# Patient Record
Sex: Male | Born: 1965
Health system: Southern US, Community
[De-identification: ages and names within clinical notes are randomized; demographics above are authoritative.]

## PROBLEM LIST (undated history)

## (undated) DIAGNOSIS — E119 Type 2 diabetes mellitus without complications: Secondary | ICD-10-CM

## (undated) DIAGNOSIS — K449 Diaphragmatic hernia without obstruction or gangrene: Secondary | ICD-10-CM

## (undated) DIAGNOSIS — Z8719 Personal history of other diseases of the digestive system: Secondary | ICD-10-CM

## (undated) DIAGNOSIS — Z8711 Personal history of peptic ulcer disease: Secondary | ICD-10-CM

## (undated) DIAGNOSIS — I10 Essential (primary) hypertension: Secondary | ICD-10-CM

## (undated) DIAGNOSIS — I4891 Unspecified atrial fibrillation: Secondary | ICD-10-CM

## (undated) DIAGNOSIS — I5021 Acute systolic (congestive) heart failure: Secondary | ICD-10-CM

## (undated) HISTORY — DX: Personal history of other diseases of the digestive system: Z87.19

## (undated) HISTORY — DX: Diaphragmatic hernia without obstruction or gangrene: K44.9

## (undated) HISTORY — PX: ABDOMINAL SURGERY: SHX537

## (undated) HISTORY — PX: APPENDECTOMY: SHX54

## (undated) HISTORY — DX: Personal history of peptic ulcer disease: Z87.11

---

## 2017-11-19 ENCOUNTER — Emergency Department (HOSPITAL_COMMUNITY): Payer: Self-pay

## 2017-11-19 ENCOUNTER — Other Ambulatory Visit: Payer: Self-pay

## 2017-11-19 ENCOUNTER — Encounter (HOSPITAL_COMMUNITY): Payer: Self-pay | Admitting: Emergency Medicine

## 2017-11-19 ENCOUNTER — Inpatient Hospital Stay (HOSPITAL_COMMUNITY)
Admission: EM | Admit: 2017-11-19 | Discharge: 2017-11-26 | DRG: 291 | Disposition: A | Discharge status: Home / Self Care | Payer: Self-pay | Attending: Internal Medicine | Admitting: Internal Medicine

## 2017-11-19 DIAGNOSIS — Z6841 Body Mass Index (BMI) 40.0 and over, adult: Secondary | ICD-10-CM

## 2017-11-19 DIAGNOSIS — I080 Rheumatic disorders of both mitral and aortic valves: Secondary | ICD-10-CM | POA: Diagnosis present

## 2017-11-19 DIAGNOSIS — L03119 Cellulitis of unspecified part of limb: Secondary | ICD-10-CM

## 2017-11-19 DIAGNOSIS — E118 Type 2 diabetes mellitus with unspecified complications: Secondary | ICD-10-CM

## 2017-11-19 DIAGNOSIS — I429 Cardiomyopathy, unspecified: Secondary | ICD-10-CM | POA: Diagnosis present

## 2017-11-19 DIAGNOSIS — E66813 Obesity, class 3: Secondary | ICD-10-CM | POA: Diagnosis present

## 2017-11-19 DIAGNOSIS — R791 Abnormal coagulation profile: Secondary | ICD-10-CM | POA: Diagnosis present

## 2017-11-19 DIAGNOSIS — I10 Essential (primary) hypertension: Secondary | ICD-10-CM | POA: Diagnosis present

## 2017-11-19 DIAGNOSIS — I4819 Other persistent atrial fibrillation: Secondary | ICD-10-CM | POA: Diagnosis present

## 2017-11-19 DIAGNOSIS — N183 Chronic kidney disease, stage 3 unspecified: Secondary | ICD-10-CM | POA: Diagnosis present

## 2017-11-19 DIAGNOSIS — I5023 Acute on chronic systolic (congestive) heart failure: Secondary | ICD-10-CM

## 2017-11-19 DIAGNOSIS — E1122 Type 2 diabetes mellitus with diabetic chronic kidney disease: Secondary | ICD-10-CM | POA: Diagnosis present

## 2017-11-19 DIAGNOSIS — I4891 Unspecified atrial fibrillation: Secondary | ICD-10-CM | POA: Diagnosis present

## 2017-11-19 DIAGNOSIS — R001 Bradycardia, unspecified: Secondary | ICD-10-CM | POA: Diagnosis not present

## 2017-11-19 DIAGNOSIS — I481 Persistent atrial fibrillation: Secondary | ICD-10-CM | POA: Diagnosis present

## 2017-11-19 DIAGNOSIS — E119 Type 2 diabetes mellitus without complications: Secondary | ICD-10-CM

## 2017-11-19 DIAGNOSIS — I5022 Chronic systolic (congestive) heart failure: Secondary | ICD-10-CM | POA: Diagnosis present

## 2017-11-19 DIAGNOSIS — Z8249 Family history of ischemic heart disease and other diseases of the circulatory system: Secondary | ICD-10-CM

## 2017-11-19 DIAGNOSIS — I130 Hypertensive heart and chronic kidney disease with heart failure and stage 1 through stage 4 chronic kidney disease, or unspecified chronic kidney disease: Principal | ICD-10-CM | POA: Diagnosis present

## 2017-11-19 DIAGNOSIS — E662 Morbid (severe) obesity with alveolar hypoventilation: Secondary | ICD-10-CM | POA: Diagnosis present

## 2017-11-19 DIAGNOSIS — I5021 Acute systolic (congestive) heart failure: Secondary | ICD-10-CM | POA: Diagnosis present

## 2017-11-19 DIAGNOSIS — I447 Left bundle-branch block, unspecified: Secondary | ICD-10-CM | POA: Diagnosis present

## 2017-11-19 DIAGNOSIS — N179 Acute kidney failure, unspecified: Secondary | ICD-10-CM | POA: Diagnosis present

## 2017-11-19 HISTORY — DX: Essential (primary) hypertension: I10

## 2017-11-19 HISTORY — DX: Type 2 diabetes mellitus without complications: E11.9

## 2017-11-19 HISTORY — DX: Acute systolic (congestive) heart failure: I50.21

## 2017-11-19 HISTORY — DX: Unspecified atrial fibrillation: I48.91

## 2017-11-19 HISTORY — DX: Morbid (severe) obesity due to excess calories: E66.01

## 2017-11-19 LAB — COMPREHENSIVE METABOLIC PANEL WITH GFR
ALT: <5 U/L — ABNORMAL LOW (ref 17–63)
AST: 37 U/L (ref 15–41)
Albumin: 3.7 g/dL (ref 3.5–5.0)
Alkaline Phosphatase: 84 U/L (ref 38–126)
Anion gap: 9 (ref 5–15)
BUN: 20 mg/dL (ref 6–20)
CO2: 26 mmol/L (ref 22–32)
Calcium: 8.9 mg/dL (ref 8.9–10.3)
Chloride: 110 mmol/L (ref 101–111)
Creatinine, Ser: 1.55 mg/dL — ABNORMAL HIGH (ref 0.61–1.24)
GFR calc Af Amer: 59 mL/min — ABNORMAL LOW
GFR calc non Af Amer: 51 mL/min — ABNORMAL LOW
Glucose, Bld: 110 mg/dL — ABNORMAL HIGH (ref 65–99)
Potassium: 4.2 mmol/L (ref 3.5–5.1)
Sodium: 145 mmol/L (ref 135–145)
Total Bilirubin: 1 mg/dL (ref 0.3–1.2)
Total Protein: 6.9 g/dL (ref 6.5–8.1)

## 2017-11-19 LAB — CBC WITH DIFFERENTIAL/PLATELET
BASOS ABS: 0 10*3/uL (ref 0.0–0.1)
Basophils Relative: 0 %
Eosinophils Absolute: 0.2 10*3/uL (ref 0.0–0.7)
Eosinophils Relative: 2 %
HEMATOCRIT: 41.2 % (ref 39.0–52.0)
HEMOGLOBIN: 13.5 g/dL (ref 13.0–17.0)
LYMPHS PCT: 20 %
Lymphs Abs: 1.7 10*3/uL (ref 0.7–4.0)
MCH: 30.7 pg (ref 26.0–34.0)
MCHC: 32.8 g/dL (ref 30.0–36.0)
MCV: 93.6 fL (ref 78.0–100.0)
MONO ABS: 0.6 10*3/uL (ref 0.1–1.0)
MONOS PCT: 7 %
NEUTROS ABS: 6 10*3/uL (ref 1.7–7.7)
NEUTROS PCT: 71 %
Platelets: 249 10*3/uL (ref 150–400)
RBC: 4.4 MIL/uL (ref 4.22–5.81)
RDW: 15 % (ref 11.5–15.5)
WBC: 8.5 10*3/uL (ref 4.0–10.5)

## 2017-11-19 LAB — I-STAT TROPONIN, ED: TROPONIN I, POC: 0.08 ng/mL (ref 0.00–0.08)

## 2017-11-19 LAB — BRAIN NATRIURETIC PEPTIDE: B Natriuretic Peptide: 248.5 pg/mL — ABNORMAL HIGH (ref 0.0–100.0)

## 2017-11-19 LAB — I-STAT CG4 LACTIC ACID, ED: LACTIC ACID, VENOUS: 1.25 mmol/L (ref 0.5–1.9)

## 2017-11-19 LAB — D-DIMER, QUANTITATIVE: D-Dimer, Quant: 1.21 ug{FEU}/mL — ABNORMAL HIGH (ref 0.00–0.50)

## 2017-11-19 MED ORDER — SODIUM CHLORIDE 0.9% FLUSH: 3.0000 mL | INTRAVENOUS | Status: DC | PRN

## 2017-11-19 MED ORDER — INSULIN ASPART 100 UNIT/ML ~~LOC~~ SOLN: 0.0000 [IU] | Freq: Three times a day (TID) | SUBCUTANEOUS | Status: DC

## 2017-11-19 MED ORDER — IOPAMIDOL (ISOVUE-370) INJECTION 76%
INTRAVENOUS | Status: AC
  Administered 2017-11-19: 100 mL
  Filled 2017-11-19: qty 100

## 2017-11-19 MED ORDER — SODIUM CHLORIDE 0.9 % IV SOLN: 250.0000 mL | INTRAVENOUS | Status: DC | PRN

## 2017-11-19 MED ORDER — METOPROLOL TARTRATE 25 MG PO TABS
12.5000 mg | ORAL_TABLET | Freq: Two times a day (BID) | ORAL | Status: DC
  Administered 2017-11-19 – 2017-11-20 (×2): 12.5 mg via ORAL
  Filled 2017-11-19 (×2): qty 1

## 2017-11-19 MED ORDER — SODIUM CHLORIDE 0.9 % IJ SOLN
INTRAMUSCULAR | Status: AC
  Filled 2017-11-19: qty 50

## 2017-11-19 MED ORDER — HEPARIN BOLUS VIA INFUSION
5000.0000 [IU] | Freq: Once | INTRAVENOUS | Status: AC
  Administered 2017-11-20: 5000 [IU] via INTRAVENOUS
  Filled 2017-11-19: qty 5000

## 2017-11-19 MED ORDER — FUROSEMIDE 10 MG/ML IJ SOLN
20.0000 mg | Freq: Once | INTRAMUSCULAR | Status: AC
  Administered 2017-11-19: 20 mg via INTRAVENOUS
  Filled 2017-11-19: qty 4

## 2017-11-19 MED ORDER — ONDANSETRON HCL 4 MG/2ML IJ SOLN: 4.0000 mg | Freq: Four times a day (QID) | INTRAMUSCULAR | Status: DC | PRN

## 2017-11-19 MED ORDER — HEPARIN (PORCINE) IN NACL 100-0.45 UNIT/ML-% IJ SOLN
1600.0000 [IU]/h | INTRAMUSCULAR | Status: DC
  Administered 2017-11-20: 1600 [IU]/h via INTRAVENOUS
  Filled 2017-11-19 (×2): qty 250

## 2017-11-19 MED ORDER — ACETAMINOPHEN 325 MG PO TABS: 650.0000 mg | ORAL_TABLET | ORAL | Status: DC | PRN

## 2017-11-19 MED ORDER — FUROSEMIDE 10 MG/ML IJ SOLN
40.0000 mg | Freq: Every day | INTRAMUSCULAR | Status: DC
  Filled 2017-11-19: qty 4

## 2017-11-19 MED ORDER — ALBUTEROL SULFATE (2.5 MG/3ML) 0.083% IN NEBU
5.0000 mg | INHALATION_SOLUTION | Freq: Once | RESPIRATORY_TRACT | Status: AC
  Administered 2017-11-19: 5 mg via RESPIRATORY_TRACT
  Filled 2017-11-19: qty 6

## 2017-11-19 MED ORDER — SODIUM CHLORIDE 0.9% FLUSH
3.0000 mL | Freq: Two times a day (BID) | INTRAVENOUS | Status: DC
  Administered 2017-11-19 – 2017-11-26 (×12): 3 mL via INTRAVENOUS

## 2017-11-19 NOTE — H&P (Signed)
History and Physical    Joe Blake IHK:[id number] DOB: [birth year] DOA: 11/19/2017  PCP: Patient, No Pcp Per  Patient coming from: Home  I have personally briefly reviewed patient's old medical records in [place]  Chief Complaint: Leg swelling, SOB  HPI: Joe Blake is a 52 y.o. male with medical history significant of CHF, DM2, HTN.  Patient has been off of meds for last 3-4 years.  Presents to ED with c/o SOB, BLE edema, wt gain over past 12 days.  H/o hospital stay in [place] for CHF some 5-6 years ago.   ED Course: CXR and CTA chest show CHF findings.  Given 20mg  IV lasix.  Creat 1.5.  EKG shows LBBB and A.Fib.   Review of Systems: As per HPI otherwise 10 point review of systems negative.   Past Medical History:  Diagnosis Date  . CHF (congestive heart failure) ([place])   . Diabetes mellitus without complication ([place])   . Hypertension     History reviewed. No pertinent surgical history.   reports that  has never smoked. He does not have any smokeless tobacco history on file. He reports that he does not drink alcohol or use drugs.  Allergies  Allergen Reactions  . Cucumber Extract     No family history on file.   Prior to Admission medications   Medication Sig Start Date End Date Taking? Authorizing Provider  naproxen sodium (ALEVE) 220 MG tablet Take 220 mg by mouth 2 (two) times daily as needed (pain).   Yes [provider]    Physical Exam: Vitals:   11/19/17 2127 11/19/17 2130 11/19/17 2215 11/19/17 2315  BP: (!) 155/107 (!) 166/111 (!) 163/130 (!) 180/115  Pulse: (!) 110 (!) 114 (!) 58 (!) 126  Resp: 17 (!) 21 20   Temp:      TempSrc:      SpO2: 94% 95% 99%   Weight:      Height:        Constitutional: NAD, calm, comfortable Eyes: PERRL, lids and conjunctivae normal ENMT: Mucous membranes are moist. Posterior pharynx clear of any exudate or lesions.Normal dentition.  Neck: normal, supple, no masses, no thyromegaly Respiratory: clear to  auscultation bilaterally, no wheezing, no crackles. Normal respiratory effort. No accessory muscle use.  Cardiovascular: IRR, IRR. Abdomen: no tenderness, no masses palpated. No hepatosplenomegaly. Bowel sounds positive.  Musculoskeletal: no clubbing / cyanosis. No joint deformity upper and lower extremities. Good ROM, no contractures. Normal muscle tone.  Skin: no rashes, lesions, ulcers. No induration Neurologic: CN 2-12 grossly intact. Sensation intact, DTR normal. Strength 5/5 in all 4.  Psychiatric: Normal judgment and insight. Alert and oriented x 3. Normal mood.    Labs on Admission: I have personally reviewed following labs and imaging studies  CBC: Recent Labs  Lab 11/19/17 2010  WBC 8.5  NEUTROABS 6.0  HGB 13.5  HCT 41.2  MCV 93.6  PLT 756   Basic Metabolic Panel: Recent Labs  Lab 11/19/17 2010  NA 145  K 4.2  CL 110  CO2 26  GLUCOSE 110*  BUN 20  CREATININE 1.55*  CALCIUM 8.9   GFR: Estimated Creatinine Clearance: 91.4 mL/min (A) (by C-G formula based on SCr of 1.55 mg/dL (H)). Liver Function Tests: Recent Labs  Lab 11/19/17 2010  AST 37  ALT <5*  ALKPHOS 84  BILITOT 1.0  PROT 6.9  ALBUMIN 3.7   No results for input(s): LIPASE, AMYLASE in the last 168 hours. No results for input(s): AMMONIA  in the last 168 hours. Coagulation Profile: No results for input(s): INR, PROTIME in the last 168 hours. Cardiac Enzymes: No results for input(s): CKTOTAL, CKMB, CKMBINDEX, TROPONINI in the last 168 hours. BNP (last 3 results) No results for input(s): PROBNP in the last 8760 hours. HbA1C: No results for input(s): HGBA1C in the last 72 hours. CBG: No results for input(s): GLUCAP in the last 168 hours. Lipid Profile: No results for input(s): CHOL, HDL, LDLCALC, TRIG, CHOLHDL, LDLDIRECT in the last 72 hours. Thyroid Function Tests: No results for input(s): TSH, T4TOTAL, FREET4, T3FREE, THYROIDAB in the last 72 hours. Anemia Panel: No results for input(s):  VITAMINB12, FOLATE, FERRITIN, TIBC, IRON, RETICCTPCT in the last 72 hours. Urine analysis: No results found for: COLORURINE, APPEARANCEUR, LABSPEC, PHURINE, GLUCOSEU, HGBUR, BILIRUBINUR, KETONESUR, PROTEINUR, UROBILINOGEN, NITRITE, LEUKOCYTESUR  Radiological Exams on Admission: Dg Chest 2 View  Result Date: 11/19/2017 CLINICAL DATA:  Labored breathing, lower extremity swelling. EXAM: CHEST - 2 VIEW COMPARISON:  None. FINDINGS: Cardiomegaly. Lungs are clear. No pleural effusion or pneumothorax seen. Osseous structures about the chest are unremarkable. IMPRESSION: Cardiomegaly. No acute findings. No evidence of pneumonia or pulmonary edema. Electronically Signed   By: Franki Cabot M.D.   On: 11/19/2017 19:52   Ct Angio Chest Pe W And/or Wo Contrast  Result Date: 11/19/2017 CLINICAL DATA:  PE suspected, intermediate prob, positive D-dimer. Labored breathing. Waking/fluid retention. History of CHF. EXAM: CT ANGIOGRAPHY CHEST WITH CONTRAST TECHNIQUE: Multidetector CT imaging of the chest was performed using the standard protocol during bolus administration of intravenous contrast. Multiplanar CT image reconstructions and MIPs were obtained to evaluate the vascular anatomy. CONTRAST:  143mL ISOVUE-370 IOPAMIDOL (ISOVUE-370) INJECTION 76% COMPARISON:  Chest radiograph earlier this day. FINDINGS: Cardiovascular: Breathing motion artifact partially limits assessment. There are no filling defects within the pulmonary arteries to suggest pulmonary embolus. Multi chamber cardiomegaly. Thoracic aorta is normal in caliber. Contrast refluxes into the hepatic veins and IVC. Minimal pericardial fluid, physiologic versus small effusion. Mediastinum/Nodes: Multiple small mediastinal nodes all subcentimeter. Prominent right hilar nodes measuring 13 mm short axis. Smaller left hilar nodes. Esophagus is decompressed. Lungs/Pleura: Small bilateral pleural effusions, right greater than left. Mild septal thickening at the lung  bases. Heterogeneous ground-glass opacities in the upper lobes, detail obscured by motion, nonspecific. No confluent consolidation. No pulmonary mass. Trachea and mainstem bronchi not well evaluated due to breathing motion expiratory imaging. Upper Abdomen: Mild contrast refluxing into the hepatic veins and IVC. Small amount of perihepatic ascites. Musculoskeletal: There are no acute or suspicious osseous abnormalities. Review of the MIP images confirms the above findings. IMPRESSION: 1. No pulmonary embolus. 2. Findings suggest mild CHF with small bilateral pleural effusions, mild pulmonary edema and cardiomegaly. Mild contrast refluxing into the hepatic veins and IVC. Small perihepatic ascites. 3. Heterogeneous attenuation/ground-glass opacities in the upper lobes, may be pulmonary edema but are nonspecific. Small airways disease is also considered. Breathing motion limits evaluation. 4. Mild mediastinal and right hilar adenopathy is likely reactive in the setting CHF Electronically Signed   By: Jeb Levering M.D.   On: 11/19/2017 22:46    EKG: Independently reviewed.  Assessment/Plan Principal Problem:   Acute on chronic systolic CHF (congestive heart failure) (HCC) Active Problems:   New onset a-fib (HCC)   DM2 (diabetes mellitus, type 2) (HCC)   HTN (hypertension)   Obesity, Class III, BMI 40-49.9 (morbid obesity) (HCC)   CKD (chronic kidney disease) stage 3, GFR 30-59 ml/min (HCC)   LBBB (left bundle branch block)  1. Acute on chronic CHF - 1. CHF pathway 2. Lasix 40mg  IV daily 3. BMP daily 4. Strict intake and output 5. Tele monitor 6. 2d echo 7. Sending for records 8. May warrant cards consult depending on findings 2. A.Fib - 1. CHADS vasc of at least 3 2. Heparin gtt started 3. Will try and rate control with Metoprolol, starting 12.5mg  PO BID (suspect this will need to be increased). 3. HTN - 1. Starting metoprolol as above, increase as needed 2. Lasix as above 4. CKD  stage 3 - 1. Monitor creatinine with diuresis 2. Not doing ACEi for this reason 5. LBBB - 1. Unclear chronicity 2. Cardiac work up as above 6. DM2 - 1. Mod scale SSI AC 2. A1C  DVT prophylaxis: Heparin gtt Code Status: Full Family Communication: No family in room Disposition Plan: Home after admit Consults called: None Admission status: Admit to inpatient   Etta Quill DO [facility] Pager [phone]  If 7AM-7PM, please contact day team taking care of patient [email or web address] Password [facility]  11/19/2017, 11:36 PM

## 2017-11-19 NOTE — ED Notes (Signed)
Pt being taken to X-ray, will assess when he returns.

## 2017-11-19 NOTE — ED Triage Notes (Signed)
Patient presents ambulatory with labored breathing states he cut his left leg a few days ago and has had increased swelling since. Pt states excess weight over the past 12 days. States he has been taking lasix as prescribed but not helping with shortness of breath. Breathing treatment started in triage.

## 2017-11-19 NOTE — Progress Notes (Signed)
ANTICOAGULATION CONSULT NOTE - Initial Consult  Pharmacy Consult for IV heparin Indication: atrial fibrillation  Allergies  Allergen Reactions  . Cucumber Extract     Patient Measurements: Height: 6\' 1"  (185.4 cm) Weight: (!) 360 lb (163.3 kg) IBW/kg (Calculated) : 79.9 Heparin Dosing Weight: 104 kg  Vital Signs: Temp: 98.6 F (37 C) (03/18 1906) Temp Source: Oral (03/18 1906) BP: 163/130 (03/18 2215) Pulse Rate: 58 (03/18 2215)  Labs: Recent Labs    11/19/17 2010  HGB 13.5  HCT 41.2  PLT 249  CREATININE 1.55*    Estimated Creatinine Clearance: 91.4 mL/min (A) (by C-G formula based on SCr of 1.55 mg/dL (H)).   Medical History: Past Medical History:  Diagnosis Date  . CHF (congestive heart failure) ([place])   . Diabetes mellitus without complication ([place])   . Hypertension     Medications:  Scheduled:  . [START ON 11/20/2017] furosemide  40 mg Intravenous Daily  . [START ON 11/20/2017] insulin aspart  0-15 Units Subcutaneous TID WC  . metoprolol tartrate  12.5 mg Oral BID  . sodium chloride      . sodium chloride flush  3 mL Intravenous Q12H   Infusions:  . sodium chloride      Assessment: 51 yoM mildly tachypneic and in  A-fib. IV heparin per Rx.  Baseline H/H, plts WNL. Coags pending. Goal of Therapy:  Heparin level 0.3-0.7 units/ml Monitor platelets by anticoagulation protocol: Yes   Plan:  Baseline coags STAT Heparin 5000 units IV x1 now Start heparin drip at 1600 units/hr Daily HL and CBC Check 1st HL in 6 hours  Dorrene German 11/19/2017,11:08 PM

## 2017-11-19 NOTE — ED Notes (Signed)
ED TO INPATIENT HANDOFF REPORT  Name/Age/Gender Joe Blake 52 y.o. male  Code Status    Code Status Orders  (From admission, onward)        Start     Ordered   11/19/17 2302  Full code  Continuous     11/19/17 2304    Code Status History    Date Active Date Inactive Code Status Order ID Comments User Context   This patient has a current code status but no historical code status.      Home/SNF/Other Home  Chief Complaint Cellulitus; Leg Swelling; Shortness of Breath  Level of Care/Admitting Diagnosis ED Disposition    ED Disposition Condition Comment   Admit  Hospital Area: [place] [[id number]]  Level of Care: Telemetry [5]  Admit to tele based on following criteria: Acute CHF  Diagnosis: New onset a-fib [facility]) [[id number]]  Admitting Physician: Etta Quill [id number]  Attending Physician: Etta Quill [phone]  Estimated length of stay: past midnight tomorrow  Certification:: I certify this patient will need inpatient services for at least 2 midnights  PT Class (Do Not Modify): Inpatient [101]  PT Acc Code (Do Not Modify): Private [1]       Medical History Past Medical History:  Diagnosis Date  . CHF (congestive heart failure) ([place])   . Diabetes mellitus without complication ([place])   . Hypertension     Allergies Allergies  Allergen Reactions  . Cucumber Extract     IV Location/Drains/Wounds Patient Lines/Drains/Airways Status   Active Line/Drains/Airways    Name:   Placement date:   Placement time:   Site:   Days:   Peripheral IV 11/19/17 Right Forearm   11/19/17    2000    Forearm   less than 1          Labs/Imaging Results for orders placed or performed during the hospital encounter of 11/19/17 (from the past 48 hour(s))  Comprehensive metabolic panel     Status: Abnormal   Collection Time: 11/19/17  8:10 PM  Result Value Ref Range   Sodium 145 135 - 145 mmol/L   Potassium 4.2 3.5 - 5.1 mmol/L   Chloride 110 101 - 111  mmol/L   CO2 26 22 - 32 mmol/L   Glucose, Bld 110 (H) 65 - 99 mg/dL   BUN 20 6 - 20 mg/dL   Creatinine, Ser 1.55 (H) 0.61 - 1.24 mg/dL   Calcium 8.9 8.9 - 10.3 mg/dL   Total Protein 6.9 6.5 - 8.1 g/dL   Albumin 3.7 3.5 - 5.0 g/dL   AST 37 15 - 41 U/L   ALT <5 (L) 17 - 63 U/L   Alkaline Phosphatase 84 38 - 126 U/L   Total Bilirubin 1.0 0.3 - 1.2 mg/dL   GFR calc non Af Amer 51 (L) >60 mL/min   GFR calc Af Amer 59 (L) >60 mL/min    Comment: (NOTE) The eGFR has been calculated using the CKD EPI equation. This calculation has not been validated in all clinical situations. eGFR's persistently <60 mL/min signify possible Chronic Kidney Disease.    Anion gap 9 5 - 15    Comment: Performed at [facility], [place] [address] [place], [place] [address]  CBC with Differential/Platelet     Status: None   Collection Time: 11/19/17  8:10 PM  Result Value Ref Range   WBC 8.5 4.0 - 10.5 K/uL   RBC 4.40 4.22 - 5.81 MIL/uL   Hemoglobin 13.5  13.0 - 17.0 g/dL   HCT 41.2 39.0 - 52.0 %   MCV 93.6 78.0 - 100.0 fL   MCH 30.7 26.0 - 34.0 pg   MCHC 32.8 30.0 - 36.0 g/dL   RDW 15.0 11.5 - 15.5 %   Platelets 249 150 - 400 K/uL   Neutrophils Relative % 71 %   Neutro Abs 6.0 1.7 - 7.7 K/uL   Lymphocytes Relative 20 %   Lymphs Abs 1.7 0.7 - 4.0 K/uL   Monocytes Relative 7 %   Monocytes Absolute 0.6 0.1 - 1.0 K/uL   Eosinophils Relative 2 %   Eosinophils Absolute 0.2 0.0 - 0.7 K/uL   Basophils Relative 0 %   Basophils Absolute 0.0 0.0 - 0.1 K/uL    Comment: Performed at [facility], [place] [address] [place], [place] [address]  Brain natriuretic peptide     Status: Abnormal   Collection Time: 11/19/17  8:10 PM  Result Value Ref Range   B Natriuretic Peptide 248.5 (H) 0.0 - 100.0 pg/mL    Comment: Performed at [facility], [place] [address] [place], [place] [address]  D-dimer, quantitative (not at [facility])     Status: Abnormal   Collection Time:  11/19/17  8:10 PM  Result Value Ref Range   D-Dimer, Quant 1.21 (H) 0.00 - 0.50 ug/mL-FEU    Comment: (NOTE) At the manufacturer cut-off of 0.50 ug/mL FEU, this assay has been documented to exclude PE with a sensitivity and negative predictive value of 97 to 99%.  At this time, this assay has not been approved by the FDA to exclude DVT/VTE. Results should be correlated with clinical presentation. Performed at [facility], [place] [address] [place], [place] [address]   I-stat troponin, ED     Status: None   Collection Time: 11/19/17  8:17 PM  Result Value Ref Range   Troponin i, poc 0.08 0.00 - 0.08 ng/mL   Comment 3            Comment: Due to the release kinetics of cTnI, a negative result within the first hours of the onset of symptoms does not rule out myocardial infarction with certainty. If myocardial infarction is still suspected, repeat the test at appropriate intervals.   I-Stat CG4 Lactic Acid, ED     Status: None   Collection Time: 11/19/17  8:19 PM  Result Value Ref Range   Lactic Acid, Venous 1.25 0.5 - 1.9 mmol/L   Dg Chest 2 View  Result Date: 11/19/2017 CLINICAL DATA:  Labored breathing, lower extremity swelling. EXAM: CHEST - 2 VIEW COMPARISON:  None. FINDINGS: Cardiomegaly. Lungs are clear. No pleural effusion or pneumothorax seen. Osseous structures about the chest are unremarkable. IMPRESSION: Cardiomegaly. No acute findings. No evidence of pneumonia or pulmonary edema. Electronically Signed   By: Franki Cabot M.D.   On: 11/19/2017 19:52   Ct Angio Chest Pe W And/or Wo Contrast  Result Date: 11/19/2017 CLINICAL DATA:  PE suspected, intermediate prob, positive D-dimer. Labored breathing. Waking/fluid retention. History of CHF. EXAM: CT ANGIOGRAPHY CHEST WITH CONTRAST TECHNIQUE: Multidetector CT imaging of the chest was performed using the standard protocol during bolus administration of intravenous contrast. Multiplanar CT image reconstructions  and MIPs were obtained to evaluate the vascular anatomy. CONTRAST:  1107m ISOVUE-370 IOPAMIDOL (ISOVUE-370) INJECTION 76% COMPARISON:  Chest radiograph earlier this day. FINDINGS: Cardiovascular: Breathing motion artifact partially limits assessment. There are no filling defects within the pulmonary arteries to suggest pulmonary embolus.  Multi chamber cardiomegaly. Thoracic aorta is normal in caliber. Contrast refluxes into the hepatic veins and IVC. Minimal pericardial fluid, physiologic versus small effusion. Mediastinum/Nodes: Multiple small mediastinal nodes all subcentimeter. Prominent right hilar nodes measuring 13 mm short axis. Smaller left hilar nodes. Esophagus is decompressed. Lungs/Pleura: Small bilateral pleural effusions, right greater than left. Mild septal thickening at the lung bases. Heterogeneous ground-glass opacities in the upper lobes, detail obscured by motion, nonspecific. No confluent consolidation. No pulmonary mass. Trachea and mainstem bronchi not well evaluated due to breathing motion expiratory imaging. Upper Abdomen: Mild contrast refluxing into the hepatic veins and IVC. Small amount of perihepatic ascites. Musculoskeletal: There are no acute or suspicious osseous abnormalities. Review of the MIP images confirms the above findings. IMPRESSION: 1. No pulmonary embolus. 2. Findings suggest mild CHF with small bilateral pleural effusions, mild pulmonary edema and cardiomegaly. Mild contrast refluxing into the hepatic veins and IVC. Small perihepatic ascites. 3. Heterogeneous attenuation/ground-glass opacities in the upper lobes, may be pulmonary edema but are nonspecific. Small airways disease is also considered. Breathing motion limits evaluation. 4. Mild mediastinal and right hilar adenopathy is likely reactive in the setting CHF Electronically Signed   By: Jeb Levering M.D.   On: 11/19/2017 22:46    Pending Labs Unresulted Labs (From admission, onward)   Start     Ordered    11/20/17 1478  Basic metabolic panel  Daily,   R     11/19/17 2304   11/19/17 2308  Protime-INR  STAT,   R     11/19/17 2307   11/19/17 2307  APTT  STAT,   R     11/19/17 2307   11/19/17 2259  HIV antibody (Routine Testing)  Once,   R     11/19/17 2304      Vitals/Pain Today's Vitals   11/19/17 2127 11/19/17 2130 11/19/17 2215 11/19/17 2315  BP: (!) 155/107 (!) 166/111 (!) 163/130 (!) 180/115  Pulse: (!) 110 (!) 114 (!) 58 (!) 126  Resp: 17 (!) 21 20   Temp:      TempSrc:      SpO2: 94% 95% 99%   Weight:      Height:      PainSc:        Isolation Precautions No active isolations  Medications Medications  sodium chloride 0.9 % injection (not administered)  insulin aspart (novoLOG) injection 0-15 Units (not administered)  sodium chloride flush (NS) 0.9 % injection 3 mL (3 mLs Intravenous Given 11/19/17 2311)  sodium chloride flush (NS) 0.9 % injection 3 mL (not administered)  0.9 %  sodium chloride infusion (not administered)  acetaminophen (TYLENOL) tablet 650 mg (not administered)  ondansetron (ZOFRAN) injection 4 mg (not administered)  furosemide (LASIX) injection 40 mg (not administered)  metoprolol tartrate (LOPRESSOR) tablet 12.5 mg (12.5 mg Oral Given 11/19/17 2315)  heparin bolus via infusion 5,000 Units (not administered)  heparin ADULT infusion 100 units/mL (25000 units/220m sodium chloride 0.45%) (not administered)  albuterol (PROVENTIL) (2.5 MG/3ML) 0.083% nebulizer solution 5 mg (5 mg Nebulization Given 11/19/17 1918)  furosemide (LASIX) injection 20 mg (20 mg Intravenous Given 11/19/17 2256)  iopamidol (ISOVUE-370) 76 % injection (100 mLs  Contrast Given 11/19/17 2228)    Mobility walks

## 2017-11-19 NOTE — ED Provider Notes (Signed)
[facility] DEPT Provider Note   CSN: [id number] Arrival date & time: 11/19/17  1831     History   Chief Complaint Chief Complaint  Patient presents with  . Shortness of Breath  . Leg Swelling    HPI Joe Blake is a 52 y.o. male.  HPI   Patient is a 52 year old male with past medical history significant for orbit obesity, diabetes congestive heart failure and hypertension.  Patient's been off his medication for the last 3-4 years.  Patient reports that he "all swole up".  Arrival in the ED patient mildly tachypneic, in A. fib.  Past Medical History:  Diagnosis Date  . CHF (congestive heart failure) ([place])   . Diabetes mellitus without complication ([place])   . Hypertension     There are no active problems to display for this patient.   History reviewed. No pertinent surgical history.     Home Medications    Prior to Admission medications   Not on File    Family History No family history on file.  Social History Social History   Tobacco Use  . Smoking status: Not on file  Substance Use Topics  . Alcohol use: Not on file  . Drug use: Not on file     Allergies   Patient has no allergy information on record.   Review of Systems Review of Systems  Constitutional: Positive for fatigue. Negative for activity change.  Respiratory: Positive for shortness of breath.   Cardiovascular: Positive for leg swelling. Negative for chest pain.  Gastrointestinal: Negative for abdominal pain.  All other systems reviewed and are negative.    Physical Exam Updated Vital Signs BP (!) 179/110 (BP Location: Left Arm)   Pulse (!) 116   Temp 98.6 F (37 C) (Oral)   Ht 6\' 1"  (1.854 m)   Wt (!) 163.3 kg (360 lb)   SpO2 97%   BMI 47.50 kg/m   Physical Exam  Constitutional: He is oriented to person, place, and time. He appears well-nourished.  Large 52 year old male.  With mild tachypnea.  HENT:  Head: Normocephalic.  Mouth/Throat:  Oropharynx is clear and moist.  Eyes: Conjunctivae are normal.  Neck: Normal range of motion.  Cardiovascular: Normal rate.  Pulmonary/Chest: Tachypnea noted. He has decreased breath sounds.  Mild increased work of breathing.  Abdominal: Soft. He exhibits distension.  Pitting of the edema up to abdomen.  Musculoskeletal: Normal range of motion.       Right lower leg: He exhibits edema.       Left lower leg: He exhibits edema.  Neurological: He is oriented to person, place, and time.  Skin: Skin is warm and dry. He is not diaphoretic.  Psychiatric: He has a normal mood and affect. His behavior is normal.     ED Treatments / Results  Labs (all labs ordered are listed, but only abnormal results are displayed) Labs Reviewed  COMPREHENSIVE METABOLIC PANEL  CBC WITH DIFFERENTIAL/PLATELET  BRAIN NATRIURETIC PEPTIDE  D-DIMER, QUANTITATIVE (NOT AT [facility])  I-STAT CG4 LACTIC ACID, ED  I-STAT TROPONIN, ED    EKG  EKG Interpretation None       Radiology Dg Chest 2 View  Result Date: 11/19/2017 CLINICAL DATA:  Labored breathing, lower extremity swelling. EXAM: CHEST - 2 VIEW COMPARISON:  None. FINDINGS: Cardiomegaly. Lungs are clear. No pleural effusion or pneumothorax seen. Osseous structures about the chest are unremarkable. IMPRESSION: Cardiomegaly. No acute findings. No evidence of pneumonia or pulmonary edema. Electronically Signed  By: Franki Cabot M.D.   On: 11/19/2017 19:52    Procedures Procedures (including critical care time)  Medications Ordered in ED Medications  albuterol (PROVENTIL) (2.5 MG/3ML) 0.083% nebulizer solution 5 mg (5 mg Nebulization Given 11/19/17 1918)     Initial Impression / Assessment and Plan / ED Course  I have reviewed the triage vital signs and the nursing notes.  Pertinent labs & imaging results that were available during my care of the patient were reviewed by me and considered in my medical decision making (see chart for details).      Patient is a 52 year old male with past medical history significant for orbit obesity, diabetes congestive heart failure and hypertension.  Patient's been off his medication for the last 3-4 years.  Patient reports that he "all swole up".  Arrival in the ED patient mildly tachypneic, in A. fib.  8:19 PM Patient is likely in acute fluid overload, could be due to his A. fib causing fluid buildup bilateral lower extremities.  Patient has pitting edema up to the stomach.  Will require admission for new onset A. fib, and tachypnea secondary to CHF.  Patient did not hurt himself on a nail and so he is swelling on his right lower extremity bigger than left.  D-dimer sent.  10:50 PM Patient CT angios is no evidence of pulmonary embolism, that shows fluid overload.  Will need admission admit patient given new onset of A. fib, Chads vasc at least 3.  In the setting of failure.       Final Clinical Impressions(s) / ED Diagnoses   Final diagnoses:  None    ED Discharge Orders    None       Macarthur Critchley, MD 11/19/17 2253

## 2017-11-20 ENCOUNTER — Inpatient Hospital Stay (HOSPITAL_COMMUNITY): Payer: Self-pay

## 2017-11-20 ENCOUNTER — Encounter (HOSPITAL_COMMUNITY): Payer: Self-pay

## 2017-11-20 DIAGNOSIS — I351 Nonrheumatic aortic (valve) insufficiency: Secondary | ICD-10-CM

## 2017-11-20 DIAGNOSIS — I340 Nonrheumatic mitral (valve) insufficiency: Secondary | ICD-10-CM

## 2017-11-20 DIAGNOSIS — E119 Type 2 diabetes mellitus without complications: Secondary | ICD-10-CM

## 2017-11-20 LAB — GLUCOSE, CAPILLARY
GLUCOSE-CAPILLARY: 81 mg/dL (ref 65–99)
Glucose-Capillary: 113 mg/dL — ABNORMAL HIGH (ref 65–99)
Glucose-Capillary: 104 mg/dL — ABNORMAL HIGH (ref 65–99)
Glucose-Capillary: 101 mg/dL — ABNORMAL HIGH (ref 65–99)

## 2017-11-20 LAB — HIV ANTIBODY (ROUTINE TESTING W REFLEX): HIV SCREEN 4TH GENERATION: NONREACTIVE

## 2017-11-20 LAB — BASIC METABOLIC PANEL
Anion gap: 10 (ref 5–15)
BUN: 19 mg/dL (ref 6–20)
CO2: 27 mmol/L (ref 22–32)
CREATININE: 1.53 mg/dL — AB (ref 0.61–1.24)
Calcium: 8.8 mg/dL — ABNORMAL LOW (ref 8.9–10.3)
Chloride: 106 mmol/L (ref 101–111)
GFR calc Af Amer: 60 mL/min — ABNORMAL LOW (ref >60)
GFR, EST NON AFRICAN AMERICAN: 51 mL/min — AB (ref >60)
Glucose, Bld: 128 mg/dL — ABNORMAL HIGH (ref 65–99)
POTASSIUM: 3.6 mmol/L (ref 3.5–5.1)
SODIUM: 143 mmol/L (ref 135–145)

## 2017-11-20 LAB — CBC
HCT: 41 % (ref 39.0–52.0)
HEMOGLOBIN: 12.5 g/dL — AB (ref 13.0–17.0)
MCH: 29.1 pg (ref 26.0–34.0)
MCHC: 30.5 g/dL (ref 30.0–36.0)
MCV: 95.6 fL (ref 78.0–100.0)
Platelets: 260 10*3/uL (ref 150–400)
RBC: 4.29 MIL/uL (ref 4.22–5.81)
RDW: 15.5 % (ref 11.5–15.5)
WBC: 6.9 10*3/uL (ref 4.0–10.5)

## 2017-11-20 LAB — ECHOCARDIOGRAM COMPLETE
Height: 73 in
WEIGHTICAEL: 6241.6 [oz_av]

## 2017-11-20 LAB — HEMOGLOBIN A1C
Hgb A1c MFr Bld: 5.9 % — ABNORMAL HIGH (ref 4.8–5.6)
MEAN PLASMA GLUCOSE: 122.63 mg/dL

## 2017-11-20 LAB — PROTIME-INR
INR: 1.56
Prothrombin Time: 18.6 seconds — ABNORMAL HIGH (ref 11.4–15.2)

## 2017-11-20 LAB — HEPARIN LEVEL (UNFRACTIONATED)
Heparin Unfractionated: 0.17 IU/mL — ABNORMAL LOW (ref 0.30–0.70)
Heparin Unfractionated: 0.22 IU/mL — ABNORMAL LOW (ref 0.30–0.70)

## 2017-11-20 LAB — APTT: APTT: 153 s — AB (ref 24–36)

## 2017-11-20 MED ORDER — HEPARIN BOLUS VIA INFUSION
5000.0000 [IU] | Freq: Once | INTRAVENOUS | Status: AC
  Administered 2017-11-20: 5000 [IU] via INTRAVENOUS
  Filled 2017-11-20: qty 5000

## 2017-11-20 MED ORDER — CARVEDILOL 6.25 MG PO TABS
6.2500 mg | ORAL_TABLET | Freq: Two times a day (BID) | ORAL | Status: DC
  Administered 2017-11-21: 6.25 mg via ORAL
  Filled 2017-11-20: qty 1

## 2017-11-20 MED ORDER — PERFLUTREN LIPID MICROSPHERE
1.0000 mL | INTRAVENOUS | Status: AC | PRN
  Administered 2017-11-20: 2 mL via INTRAVENOUS
  Filled 2017-11-20 (×2): qty 10

## 2017-11-20 MED ORDER — HEPARIN BOLUS VIA INFUSION
4000.0000 [IU] | Freq: Once | INTRAVENOUS | Status: AC
Start: 2017-11-20 — End: 2017-11-20
  Administered 2017-11-20: 4000 [IU] via INTRAVENOUS
  Filled 2017-11-20: qty 4000

## 2017-11-20 MED ORDER — ISOSORB DINITRATE-HYDRALAZINE 20-37.5 MG PO TABS
1.0000 | ORAL_TABLET | Freq: Three times a day (TID) | ORAL | Status: DC
  Administered 2017-11-20 – 2017-11-23 (×8): 1 via ORAL
  Filled 2017-11-20 (×9): qty 1

## 2017-11-20 MED ORDER — POTASSIUM CHLORIDE CRYS ER 20 MEQ PO TBCR
40.0000 meq | EXTENDED_RELEASE_TABLET | Freq: Once | ORAL | Status: AC
  Administered 2017-11-20: 40 meq via ORAL
  Filled 2017-11-20: qty 2

## 2017-11-20 MED ORDER — FUROSEMIDE 10 MG/ML IJ SOLN
60.0000 mg | Freq: Two times a day (BID) | INTRAMUSCULAR | Status: DC
  Administered 2017-11-20 – 2017-11-24 (×9): 60 mg via INTRAVENOUS
  Filled 2017-11-20 (×8): qty 6

## 2017-11-20 MED ORDER — HEPARIN (PORCINE) IN NACL 100-0.45 UNIT/ML-% IJ SOLN
2300.0000 [IU]/h | INTRAMUSCULAR | Status: DC
  Administered 2017-11-20: 1900 [IU]/h via INTRAVENOUS
  Administered 2017-11-20 – 2017-11-21 (×3): 2300 [IU]/h via INTRAVENOUS
  Filled 2017-11-20 (×4): qty 250

## 2017-11-20 NOTE — Progress Notes (Signed)
  Echocardiogram 2D Echocardiogram has been performed.  Merrie Roof F 11/20/2017, 4:35 PM

## 2017-11-20 NOTE — Progress Notes (Signed)
ANTICOAGULATION CONSULT NOTE - Follow Up Consult  Pharmacy Consult for heparin Indication: atrial fibrillation  Allergies  Allergen Reactions  . Cucumber Extract     Patient Measurements: Height: 6\' 1"  (185.4 cm) Weight: (!) 390 lb 1.6 oz (176.9 kg) IBW/kg (Calculated) : 79.9 Heparin Dosing Weight: 123 kg   Vital Signs: Temp: 97.6 F (36.4 C) (03/19 0513) Temp Source: Oral (03/19 0513) BP: 145/98 (03/19 0513) Pulse Rate: 99 (03/19 0513)  Labs: Recent Labs    11/19/17 2010 11/20/17 0153 11/20/17 0806  HGB 13.5  --  12.5*  HCT 41.2  --  41.0  PLT 249  --  260  APTT  --  153*  --   LABPROT  --  18.6*  --   INR  --  1.56  --   HEPARINUNFRC  --   --  0.17*  CREATININE 1.55* 1.53*  --     Estimated Creatinine Clearance: 97 mL/min (A) (by C-G formula based on SCr of 1.53 mg/dL (H)).   Assessment: Patient is a 52 y.o M presented to the ED on 3/18 with c/o SOB and was found to be in afib. Chest CTA neg for PE.  Heparin drip started on admission for afib.  Today, 11/20/2017: - first heparin level is sub-therapeutic at 0.17. Per RN, no issues with IV line. - CBC relatively stable - no bleeding documented   Goal of Therapy:  Heparin level 0.3-0.7 units/ml Monitor platelets by anticoagulation protocol: Yes   Plan:  - heparin 4000 units IV bolus x1, then increase drip to 1900 units/hr - check 6 hr heparin level - monitor for s/s bleeding  Jaspreet Hollings P 11/20/2017,9:03 AM

## 2017-11-20 NOTE — Progress Notes (Signed)
PROGRESS NOTE    Joe Blake  SWH:[id number] DOB: [birth year] DOA: 11/19/2017 PCP: Patient, No Pcp Per  Outpatient Specialists:     Brief Narrative:  Patient is a 52 year old male, morbidly obese, with past medical history significant for congestive heart failure as per prior documentation, diabetes mellitus type 2 and hypertension.  Collateral information indicates that the patient has been off his medication for about 3-4 years.  The patient is admitted with shortness of breath, bilateral lower leg edema and weight gain over the last 12 days.  Cerium creatinine is noted to be mildly elevated.  Patient be admitted for further assessment and management.   Assessment & Plan:   Principal Problem:   Acute on chronic systolic CHF (congestive heart failure) (HCC) Active Problems:   New onset a-fib (HCC)   DM2 (diabetes mellitus, type 2) (HCC)   HTN (hypertension)   Obesity, Class III, BMI 40-49.9 (morbid obesity) (HCC)   CKD (chronic kidney disease) stage 3, GFR 30-59 ml/min (HCC)   LBBB (left bundle branch block)   Acute on chronic systolic congestive heart failure: -Echo done earlier today revealed EF of 20-25%.  Severe diffuse hypokinesis with no identifiable regional variations reported.  Mild regurgitation of the aortic valve, as well as mild regurgitation of the mitral valve, right atrium is said to be severely dilated, trivial pericardial fluid was identified.  Moderately dilated cavity of the right ventricle as well as mildly reduced systolic function of the right ventricle where reported. -Will change metoprolol to Coreg. -We will start patient on BiDil. -We will hold ACE inhibitor, ARB, Aldactone for now, due to patient's impaired renal function. -Likely consult the cardiology team in the morning. -Increase IV Lasix to 60 mg twice daily. -Monitor renal function and electrolytes closely.  Diabetes mellitus: -Continue to optimize.  Hypertension, uncontrolled: -As documented  above, will change metoprolol to Coreg, and will start at 6.25 mg p.o. twice daily. -We will add BiDil. -IV Lasix 60 in the twice daily. -Continue to monitor blood pressure, renal function and adjust accordingly.  Chronic kidney disease stage III versus acute kidney injury versus acute kidney injury on chronic kidney disease: -I have not really visualized patient's prior renal function documentation. -With history of diabetes mellitus and hypertension, I suspect that this could be chronic kidney disease stage III.  However, the patient is also at risk for cardio renal syndrome.  EF is only 20-25%. -Introduce ACE inhibitor or ARB when possible.  Morbid obesity: -Diet and exercise. -Encouraged patient to lose weight.  Possibly undiagnosed OSA and obesity hypoventilation syndrome: -Pursue outpatient sleep study.  Further management will depend on hospital course.  Will will get cardiology team to see the patient in the morning.  Patient's EF will need to be monitored.    DVT prophylaxis: Subcu heparin Code Status: Full Family Communication: None Disposition Plan: Will depend on hospital course   Consultants:   Likely consult cardiology in the morning  Procedures:   Echocardiogram (kindly see above)  Antimicrobials:   None   Subjective: Poor historian.  Admitted with shortness of breath and weight gain.  No chest pain.  Objective: Vitals:   11/20/17 1031 11/20/17 1113 11/20/17 1324 11/20/17 1911  BP: (!) 151/107  (!) 149/95 (!) 168/108  Pulse: (!) 112 98 (!) 103 (!) 111  Resp: 18  18 18   Temp: 97.9 F (36.6 C)  97.7 F (36.5 C) 97.7 F (36.5 C)  TempSrc: Oral  Oral Oral  SpO2: 98%  96% 98%  Weight:      Height:        Intake/Output Summary (Last 24 hours) at 11/20/2017 1934 Last data filed at 11/20/2017 1914 Gross per 24 hour  Intake 1063.28 ml  Output 5275 ml  Net -4211.72 ml   Filed Weights   11/19/17 1906 11/20/17 0130 11/20/17 0520  Weight: (!) 163.3  kg (360 lb) (!) 176.9 kg (390 lb 1.6 oz) (!) 176.9 kg (390 lb 1.6 oz)    Examination:  General exam: Appears calm and comfortable.  Morbidly obese Respiratory system: Decreased air entry globally.  C Cardiovascular system: S1 & S2  Gastrointestinal system: Abdomen is morbidly obese, soft and nontender.  Organs are difficult to assess.  Central nervous system: Alert and oriented. No focal neurological deficits. Extremities: Chronically looking bilateral lower extremity edema.  Skin changes lower legs.  Right lower leg looks more hyperemic than the left, but the patient reports that this is chronic (for over 5 years).   Skin: Bilateral, chronic, lower leg rash, worse on the right side there is also a superficial cut on patient's right lower leg.   Data Reviewed: I have personally reviewed following labs and imaging studies  CBC: Recent Labs  Lab 11/19/17 2010 11/20/17 0806  WBC 8.5 6.9  NEUTROABS 6.0  --   HGB 13.5 12.5*  HCT 41.2 41.0  MCV 93.6 95.6  PLT 249 629   Basic Metabolic Panel: Recent Labs  Lab 11/19/17 2010 11/20/17 0153  NA 145 143  K 4.2 3.6  CL 110 106  CO2 26 27  GLUCOSE 110* 128*  BUN 20 19  CREATININE 1.55* 1.53*  CALCIUM 8.9 8.8*   GFR: Estimated Creatinine Clearance: 97 mL/min (A) (by C-G formula based on SCr of 1.53 mg/dL (H)). Liver Function Tests: Recent Labs  Lab 11/19/17 2010  AST 37  ALT <5*  ALKPHOS 84  BILITOT 1.0  PROT 6.9  ALBUMIN 3.7   No results for input(s): LIPASE, AMYLASE in the last 168 hours. No results for input(s): AMMONIA in the last 168 hours. Coagulation Profile: Recent Labs  Lab 11/20/17 0153  INR 1.56   Cardiac Enzymes: No results for input(s): CKTOTAL, CKMB, CKMBINDEX, TROPONINI in the last 168 hours. BNP (last 3 results) No results for input(s): PROBNP in the last 8760 hours. HbA1C: Recent Labs    11/20/17 0153  HGBA1C 5.9*   CBG: Recent Labs  Lab 11/20/17 0735 11/20/17 1134 11/20/17 1655  GLUCAP  113* 104* 81   Lipid Profile: No results for input(s): CHOL, HDL, LDLCALC, TRIG, CHOLHDL, LDLDIRECT in the last 72 hours. Thyroid Function Tests: No results for input(s): TSH, T4TOTAL, FREET4, T3FREE, THYROIDAB in the last 72 hours. Anemia Panel: No results for input(s): VITAMINB12, FOLATE, FERRITIN, TIBC, IRON, RETICCTPCT in the last 72 hours. Urine analysis: No results found for: COLORURINE, APPEARANCEUR, LABSPEC, PHURINE, GLUCOSEU, HGBUR, BILIRUBINUR, KETONESUR, PROTEINUR, UROBILINOGEN, NITRITE, LEUKOCYTESUR Sepsis Labs: @LABRCNTIP (procalcitonin:4,lacticidven:4)  )No results found for this or any previous visit (from the past 240 hour(s)).       Radiology Studies: Dg Chest 2 View  Result Date: 11/19/2017 CLINICAL DATA:  Labored breathing, lower extremity swelling. EXAM: CHEST - 2 VIEW COMPARISON:  None. FINDINGS: Cardiomegaly. Lungs are clear. No pleural effusion or pneumothorax seen. Osseous structures about the chest are unremarkable. IMPRESSION: Cardiomegaly. No acute findings. No evidence of pneumonia or pulmonary edema. Electronically Signed   By: Franki Cabot M.D.   On: 11/19/2017 19:52   Ct Angio Chest Pe W And/or Wo Contrast  Result Date: 11/19/2017 CLINICAL DATA:  PE suspected, intermediate prob, positive D-dimer. Labored breathing. Waking/fluid retention. History of CHF. EXAM: CT ANGIOGRAPHY CHEST WITH CONTRAST TECHNIQUE: Multidetector CT imaging of the chest was performed using the standard protocol during bolus administration of intravenous contrast. Multiplanar CT image reconstructions and MIPs were obtained to evaluate the vascular anatomy. CONTRAST:  15mL ISOVUE-370 IOPAMIDOL (ISOVUE-370) INJECTION 76% COMPARISON:  Chest radiograph earlier this day. FINDINGS: Cardiovascular: Breathing motion artifact partially limits assessment. There are no filling defects within the pulmonary arteries to suggest pulmonary embolus. Multi chamber cardiomegaly. Thoracic aorta is normal in  caliber. Contrast refluxes into the hepatic veins and IVC. Minimal pericardial fluid, physiologic versus small effusion. Mediastinum/Nodes: Multiple small mediastinal nodes all subcentimeter. Prominent right hilar nodes measuring 13 mm short axis. Smaller left hilar nodes. Esophagus is decompressed. Lungs/Pleura: Small bilateral pleural effusions, right greater than left. Mild septal thickening at the lung bases. Heterogeneous ground-glass opacities in the upper lobes, detail obscured by motion, nonspecific. No confluent consolidation. No pulmonary mass. Trachea and mainstem bronchi not well evaluated due to breathing motion expiratory imaging. Upper Abdomen: Mild contrast refluxing into the hepatic veins and IVC. Small amount of perihepatic ascites. Musculoskeletal: There are no acute or suspicious osseous abnormalities. Review of the MIP images confirms the above findings. IMPRESSION: 1. No pulmonary embolus. 2. Findings suggest mild CHF with small bilateral pleural effusions, mild pulmonary edema and cardiomegaly. Mild contrast refluxing into the hepatic veins and IVC. Small perihepatic ascites. 3. Heterogeneous attenuation/ground-glass opacities in the upper lobes, may be pulmonary edema but are nonspecific. Small airways disease is also considered. Breathing motion limits evaluation. 4. Mild mediastinal and right hilar adenopathy is likely reactive in the setting CHF Electronically Signed   By: Jeb Levering M.D.   On: 11/19/2017 22:46        Scheduled Meds: . [START ON 11/21/2017] carvedilol  6.25 mg Oral BID WC  . furosemide  60 mg Intravenous BID  . insulin aspart  0-15 Units Subcutaneous TID WC  . isosorbide-hydrALAZINE  1 tablet Oral TID  . sodium chloride flush  3 mL Intravenous Q12H   Continuous Infusions: . sodium chloride    . heparin 2,300 Units/hr (11/20/17 1720)     LOS: 1 day    Time spent: 35 minutes    Dana Allan, MD  [facility] Pager #: [phone] 7PM-7AM contact night coverage as above

## 2017-11-20 NOTE — Progress Notes (Signed)
[place] for heparin Indication: atrial fibrillation  Allergies  Allergen Reactions  . Cucumber Extract    Patient Measurements: Height: 6\' 1"  (185.4 cm) Weight: (!) 390 lb 1.6 oz (176.9 kg) IBW/kg (Calculated) : 79.9 Heparin Dosing Weight: 123 kg   Vital Signs: Temp: 97.7 F (36.5 C) (03/19 1324) Temp Source: Oral (03/19 1324) BP: 149/95 (03/19 1324) Pulse Rate: 103 (03/19 1324)  Labs: Recent Labs    11/19/17 2010 11/20/17 0153 11/20/17 0806 11/20/17 1621  HGB 13.5  --  12.5*  --   HCT 41.2  --  41.0  --   PLT 249  --  260  --   APTT  --  153*  --   --   LABPROT  --  18.6*  --   --   INR  --  1.56  --   --   HEPARINUNFRC  --   --  0.17* 0.22*  CREATININE 1.55* 1.53*  --   --    Estimated Creatinine Clearance: 97 mL/min (A) (by C-G formula based on SCr of 1.53 mg/dL (H)).  Assessment: Patient is a 52 y.o M presented to the ED on 3/18 with c/o SOB and was found to be in afib. Chest CTA neg for PE.  Heparin drip started on admission for afib.  Today, 11/20/2017:  First heparin level is sub-therapeutic at 0.17. Per RN, no issues with IV line, rebolus 4000 units/rate increase  Second Heparin level remains low at 0.22 units/ml, only interrupted x 3 min for Definity for 2D echo  CBC relatively stable  no bleeding documented  Goal of Therapy:  Heparin level 0.3-0.7 units/ml Monitor platelets by anticoagulation protocol: Yes   Plan:   Rebolus again with 5000 units, increase Heparin rate to 2300 units/hr  Re-check level in 6 hr (2300)  Daily CBC, daily Heparin level when therapeutic and at steady state  Monitor s/s bleed  No discussion yet regarding oral anti-coagulation  Minda Ditto PharmD Pager [phone] 11/20/2017, 5:13 PM

## 2017-11-21 ENCOUNTER — Encounter (HOSPITAL_COMMUNITY): Payer: Self-pay | Admitting: *Deleted

## 2017-11-21 DIAGNOSIS — I4891 Unspecified atrial fibrillation: Secondary | ICD-10-CM | POA: Diagnosis present

## 2017-11-21 DIAGNOSIS — I5023 Acute on chronic systolic (congestive) heart failure: Secondary | ICD-10-CM

## 2017-11-21 DIAGNOSIS — I5021 Acute systolic (congestive) heart failure: Secondary | ICD-10-CM | POA: Diagnosis present

## 2017-11-21 LAB — RENAL FUNCTION PANEL
Albumin: 3.3 g/dL — ABNORMAL LOW (ref 3.5–5.0)
Anion gap: 10 (ref 5–15)
BUN: 20 mg/dL (ref 6–20)
CO2: 28 mmol/L (ref 22–32)
Calcium: 8.3 mg/dL — ABNORMAL LOW (ref 8.9–10.3)
Chloride: 103 mmol/L (ref 101–111)
Creatinine, Ser: 1.34 mg/dL — ABNORMAL HIGH (ref 0.61–1.24)
GFR calc Af Amer: >60 mL/min (ref >60)
GFR calc non Af Amer: >60 mL/min (ref >60)
Glucose, Bld: 115 mg/dL — ABNORMAL HIGH (ref 65–99)
Phosphorus: 4.2 mg/dL (ref 2.5–4.6)
Potassium: 3.6 mmol/L (ref 3.5–5.1)
Sodium: 141 mmol/L (ref 135–145)

## 2017-11-21 LAB — BASIC METABOLIC PANEL
Anion gap: 10 (ref 5–15)
BUN: 20 mg/dL (ref 6–20)
CALCIUM: 8.5 mg/dL — AB (ref 8.9–10.3)
CO2: 28 mmol/L (ref 22–32)
Chloride: 104 mmol/L (ref 101–111)
Creatinine, Ser: 1.33 mg/dL — ABNORMAL HIGH (ref 0.61–1.24)
Glucose, Bld: 115 mg/dL — ABNORMAL HIGH (ref 65–99)
Potassium: 3.6 mmol/L (ref 3.5–5.1)
SODIUM: 142 mmol/L (ref 135–145)

## 2017-11-21 LAB — CBC
HCT: 40.9 % (ref 39.0–52.0)
Hemoglobin: 12.4 g/dL — ABNORMAL LOW (ref 13.0–17.0)
MCH: 29.2 pg (ref 26.0–34.0)
MCHC: 30.3 g/dL (ref 30.0–36.0)
MCV: 96.2 fL (ref 78.0–100.0)
Platelets: 251 10*3/uL (ref 150–400)
RBC: 4.25 MIL/uL (ref 4.22–5.81)
RDW: 15.3 % (ref 11.5–15.5)
WBC: 6.5 10*3/uL (ref 4.0–10.5)

## 2017-11-21 LAB — HEPARIN LEVEL (UNFRACTIONATED)
Heparin Unfractionated: 0.55 IU/mL (ref 0.30–0.70)
Heparin Unfractionated: 0.56 IU/mL (ref 0.30–0.70)

## 2017-11-21 LAB — GLUCOSE, CAPILLARY
GLUCOSE-CAPILLARY: 97 mg/dL (ref 65–99)
GLUCOSE-CAPILLARY: 147 mg/dL — AB (ref 65–99)
GLUCOSE-CAPILLARY: 90 mg/dL (ref 65–99)
Glucose-Capillary: 91 mg/dL (ref 65–99)

## 2017-11-21 LAB — MAGNESIUM: Magnesium: 1.9 mg/dL (ref 1.7–2.4)

## 2017-11-21 MED ORDER — CARVEDILOL 12.5 MG PO TABS
12.5000 mg | ORAL_TABLET | Freq: Two times a day (BID) | ORAL | Status: DC
  Administered 2017-11-21 – 2017-11-23 (×4): 12.5 mg via ORAL
  Filled 2017-11-21 (×4): qty 1

## 2017-11-21 NOTE — Progress Notes (Signed)
[place] for heparin Indication: atrial fibrillation  Allergies  Allergen Reactions  . Cucumber Extract    Patient Measurements: Height: 6\' 1"  (185.4 cm) Weight: (!) 390 lb 1.6 oz (176.9 kg) IBW/kg (Calculated) : 79.9 Heparin Dosing Weight: 123 kg   Vital Signs: Temp: 98.5 F (36.9 C) (03/19 2129) Temp Source: Oral (03/19 2129) BP: 153/98 (03/20 0019) Pulse Rate: 93 (03/19 2129)  Labs: Recent Labs    11/19/17 2010 11/20/17 0153 11/20/17 0806 11/20/17 1621 11/21/17 0015  HGB 13.5  --  12.5*  --   --   HCT 41.2  --  41.0  --   --   PLT 249  --  260  --   --   APTT  --  153*  --   --   --   LABPROT  --  18.6*  --   --   --   INR  --  1.56  --   --   --   HEPARINUNFRC  --   --  0.17* 0.22* 0.56  CREATININE 1.55* 1.53*  --   --   --    Estimated Creatinine Clearance: 97 mL/min (A) (by C-G formula based on SCr of 1.53 mg/dL (H)).  Assessment: Patient is a 52 y.o M presented to the ED on 3/18 with c/o SOB and was found to be in afib. Chest CTA neg for PE.  Heparin drip started on admission for afib.  3/19  First heparin level is sub-therapeutic at 0.17. Per RN, no issues with IV line, rebolus 4000 units/rate increase  Second Heparin level remains low at 0.22 units/ml, only interrupted x 3 min for Definity for 2D echo  CBC relatively stable  no bleeding documented Today, 3/20  0015 HL=0.56 at goal, no infusion or bleeding issues per RN.   Goal of Therapy:  Heparin level 0.3-0.7 units/ml Monitor platelets by anticoagulation protocol: Yes   Plan:   Continue heparin drip at  2300 units/hr  Recheck HL in 6 hours  Daily CBC, daily Heparin level when therapeutic and at steady state  Monitor s/s bleed  No discussion yet regarding oral anti-coagulation   Dorrene German 11/21/2017, 1:37 AM

## 2017-11-21 NOTE — Consult Note (Addendum)
Cardiology Consultation:   Patient ID: Joe Blake; [id number]; [birth year]   Admit date: 11/19/2017 Date of Consult: 11/21/2017  Primary Care Provider: Patient, No Pcp Per Primary Cardiologist: New - Dr. Percival Spanish Primary Electrophysiologist:  N/A   Patient Profile:   Joe Blake is a 52 y.o. male with a hx of morbid obesity HTN, DM II and CHF who is being seen today for the evaluation of new onset atrial fibrillation with RVR and acute systolic HF at the request of Dr. Marthenia Rolling.  History of Present Illness:   Joe Blake is a 52 year old morbidly obese male was past medical history of hypertension, DM 2 and history of CHF.  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  He was admitted at [facility] in [place] in 2013 with acute heart failure.  He was told his heart was pumping at around 33%.  Since his discharge at that time, he has not been established with a primary care provider nor has he been followed by a cardiologist.   He denies any recent episode of palpitation, he will occasionally have a very focal chest pain on the left side, which is actually better when he rubs on it.  It sounds more musculoskeletal in nature.  It does not occur with exertion.  Two weeks ago, he cut his right leg, 2 days later he started noticing increasing lower extremity edema.  He also noticed worsening shortness of breath with exertion.  He eventually sought medical attention at [facility].  Initial d-dimer was elevated, however CTA of the chest was negative for PE, although it does show pulmonary edema concerning for acute heart failure.  BNP was 248.5.  Hemoglobin A1c was 5.9.  Echocardiogram obtained showed EF 20-25%.   Past Medical History:  Diagnosis Date  . CHF (congestive heart failure) ([facility])   . Diabetes mellitus without complication ([place])   . Hypertension     Past  Surgical History:  Procedure Laterality Date  . ABDOMINAL SURGERY       Home Medications:  Prior to Admission medications   Medication Sig Start Date End Date Taking? Authorizing Provider  naproxen sodium (ALEVE) 220 MG tablet Take 220 mg by mouth 2 (two) times daily as needed (pain).   Yes [provider]    Inpatient Medications: Scheduled Meds: . carvedilol  6.25 mg Oral BID WC  . furosemide  60 mg Intravenous BID  . insulin aspart  0-15 Units Subcutaneous TID WC  . isosorbide-hydrALAZINE  1 tablet Oral TID  . sodium chloride flush  3 mL Intravenous Q12H   Continuous Infusions: . sodium chloride    . heparin 2,300 Units/hr (11/21/17 0913)   PRN Meds: sodium chloride, acetaminophen, ondansetron (ZOFRAN) IV, sodium chloride flush  Allergies:    Allergies  Allergen Reactions  . Cucumber Extract     Social History:   Social History   Socioeconomic History  . Marital status: Single    Spouse name: Not on file  . Number of children: Not on file  . Years of education: Not on file  . Highest education level: Not on file  Social Needs  . Financial resource strain: Somewhat hard  . Food insecurity - worry: Patient refused  . Food insecurity - inability: Patient refused  . Transportation needs - medical: No  . Transportation needs - non-medical: No  Occupational History  . Not on file  Tobacco Use  . Smoking status: Never Smoker  . Smokeless tobacco: Never Used  Substance and Sexual Activity  . Alcohol use: No    Frequency: Never  . Drug use: No  . Sexual activity: Yes    Partners: Female  Other Topics Concern  . Not on file  Social History Narrative  . Not on file    Family History:    Family History  Problem Relation Age of Onset  . Heart attack Paternal Grandfather      ROS:  Please see the history of present illness.   All other ROS reviewed and negative.     Physical Exam/Data:   Vitals:   11/21/17 0019 11/21/17 0500 11/21/17 0559  11/21/17 0608  BP: (!) 153/98  (!) 154/96   Pulse:   99   Resp:   20   Temp:   97.7 F (36.5 C)   TempSrc:   Oral   SpO2:   95%   Weight:  (!) 382 lb 9.6 oz (173.5 kg)  (!) 382 lb 9.6 oz (173.5 kg)  Height:        Intake/Output Summary (Last 24 hours) at 11/21/2017 1103 Last data filed at 11/21/2017 0600 Gross per 24 hour  Intake 1282.7 ml  Output 5770 ml  Net -4487.3 ml   Filed Weights   11/20/17 0520 11/21/17 0500 11/21/17 0608  Weight: (!) 390 lb 1.6 oz (176.9 kg) (!) 382 lb 9.6 oz (173.5 kg) (!) 382 lb 9.6 oz (173.5 kg)   Body mass index is 50.48 kg/m.  General:  Well nourished, well developed, in no acute distress HEENT: normal Lymph: no adenopathy Neck: no JVD Endocrine:  No thryomegaly Vascular: No carotid bruits; FA pulses 2+ bilaterally without bruits  Cardiac:  normal S1, S2; RRR; no murmur  Lungs:  clear to auscultation bilaterally, no wheezing, rhonchi or rales  Abd: soft, nontender, no hepatomegaly  Ext: 1-2+ pitting edema Musculoskeletal:  No deformities, BUE and BLE strength normal and equal Skin: warm and dry  Neuro:  CNs 2-12 intact, no focal abnormalities noted Psych:  Normal affect   EKG:  The EKG was personally reviewed and demonstrates:  Atrial fibrillation Telemetry:  Telemetry was personally reviewed and demonstrates:  Atrial fibrillation, rate controlled  Relevant CV Studies:  Echo 11/20/2017 LV EF: 20% -   25%  Study Conclusions  - Left ventricle: The cavity size was moderately dilated. Wall   thickness was increased in a pattern of mild LVH. Left   ventricular geometry showed evidence of eccentric hypertrophy.   Systolic function was severely reduced. The estimated ejection   fraction was in the range of 20% to 25%. Severe diffuse   hypokinesis with no identifiable regional variations. Acoustic   contrast opacification revealed no evidence ofthrombus. - Aortic valve: There was mild regurgitation. - Mitral valve: There was mild  regurgitation directed centrally. - Left atrium: The atrium was severely dilated. - Right ventricle: The cavity size was moderately dilated. Systolic   function was mildly reduced. - Right atrium: The atrium was severely dilated. - Pericardium, extracardiac: A trivial pericardial effusion was   identified.   Laboratory Data:  Chemistry Recent Labs  Lab 11/19/17 2010 11/20/17 0153 11/21/17 0544  NA 145 143 141  142  K 4.2 3.6 3.6  3.6  CL 110 106 103  104  CO2 26 27 28  28   GLUCOSE 110* 128* 115*  115*  BUN 20 19 20  20   CREATININE 1.55* 1.53* 1.34*  1.33*  CALCIUM 8.9 8.8* 8.3*  8.5*  GFRNONAA 51* 51* >60  >60  GFRAA 59* 60* >60  >60  ANIONGAP 9 10 10  10     Recent Labs  Lab 11/19/17 2010 11/21/17 0544  PROT 6.9  --   ALBUMIN 3.7 3.3*  AST 37  --   ALT <5*  --   ALKPHOS 84  --   BILITOT 1.0  --    Hematology Recent Labs  Lab 11/19/17 2010 11/20/17 0806 11/21/17 0544  WBC 8.5 6.9 6.5  RBC 4.40 4.29 4.25  HGB 13.5 12.5* 12.4*  HCT 41.2 41.0 40.9  MCV 93.6 95.6 96.2  MCH 30.7 29.1 29.2  MCHC 32.8 30.5 30.3  RDW 15.0 15.5 15.3  PLT 249 260 251   Cardiac EnzymesNo results for input(s): TROPONINI in the last 168 hours.  Recent Labs  Lab 11/19/17 2017  TROPIPOC 0.08    BNP Recent Labs  Lab 11/19/17 2010  BNP 248.5*    DDimer  Recent Labs  Lab 11/19/17 2010  DDIMER 1.21*    Radiology/Studies:  Dg Chest 2 View  Result Date: 11/19/2017 CLINICAL DATA:  Labored breathing, lower extremity swelling. EXAM: CHEST - 2 VIEW COMPARISON:  None. FINDINGS: Cardiomegaly. Lungs are clear. No pleural effusion or pneumothorax seen. Osseous structures about the chest are unremarkable. IMPRESSION: Cardiomegaly. No acute findings. No evidence of pneumonia or pulmonary edema. Electronically Signed   By: Franki Cabot M.D.   On: 11/19/2017 19:52   Ct Angio Chest Pe W And/or Wo Contrast  Result Date: 11/19/2017 CLINICAL DATA:  PE suspected, intermediate  prob, positive D-dimer. Labored breathing. Waking/fluid retention. History of CHF. EXAM: CT ANGIOGRAPHY CHEST WITH CONTRAST TECHNIQUE: Multidetector CT imaging of the chest was performed using the standard protocol during bolus administration of intravenous contrast. Multiplanar CT image reconstructions and MIPs were obtained to evaluate the vascular anatomy. CONTRAST:  149mL ISOVUE-370 IOPAMIDOL (ISOVUE-370) INJECTION 76% COMPARISON:  Chest radiograph earlier this day. FINDINGS: Cardiovascular: Breathing motion artifact partially limits assessment. There are no filling defects within the pulmonary arteries to suggest pulmonary embolus. Multi chamber cardiomegaly. Thoracic aorta is normal in caliber. Contrast refluxes into the hepatic veins and IVC. Minimal pericardial fluid, physiologic versus small effusion. Mediastinum/Nodes: Multiple small mediastinal nodes all subcentimeter. Prominent right hilar nodes measuring 13 mm short axis. Smaller left hilar nodes. Esophagus is decompressed. Lungs/Pleura: Small bilateral pleural effusions, right greater than left. Mild septal thickening at the lung bases. Heterogeneous ground-glass opacities in the upper lobes, detail obscured by motion, nonspecific. No confluent consolidation. No pulmonary mass. Trachea and mainstem bronchi not well evaluated due to breathing motion expiratory imaging. Upper Abdomen: Mild contrast refluxing into the hepatic veins and IVC. Small amount of perihepatic ascites. Musculoskeletal: There are no acute or suspicious osseous abnormalities. Review of the MIP images confirms the above findings. IMPRESSION: 1. No pulmonary embolus. 2. Findings suggest mild CHF with small bilateral pleural effusions, mild pulmonary edema and cardiomegaly. Mild contrast refluxing into the hepatic veins and IVC. Small perihepatic ascites. 3. Heterogeneous attenuation/ground-glass opacities in the upper lobes, may be pulmonary edema but are nonspecific. Small airways  disease is also considered. Breathing motion limits evaluation. 4. Mild mediastinal and right hilar adenopathy is likely reactive in the setting CHF Electronically Signed   By: Jeb Levering M.D.   On: 11/19/2017 22:46    Assessment and Plan:   1. Acute systolic heart failure: EF 20-25%, per patient, he was admitted at [facility] in [place]  [place] in 2013 and was told his heart was pumping at 33%.  We will need to request records.  He is massively volume overloaded, which is also masked by his morbid obesity.  He currently weighs 380 pounds.  He is a poor candidate for myocardial perfusion image.  -Unclear cause for systolic heart failure, unclear duration either.  -I would recommend maximizing heart failure medication and discharge him on appropriate heart failure medication and diuretic.  With new onset of atrial fibrillation with RVR, there is some suspicion of tachycardia mediated cardiomyopathy.  -Once atrial fibrillation is under control and heart failure medication maximize, can potentially repeat echocardiogram in 3 months, if EF remain low, will need cardiac catheterization.  (Pending our review of the [facility] records.)  2. Newly diagnosed atrial fibrillation with RVR: He was not aware of previous diagnosis of atrial fibrillation.  - CHA2DS-Vasc score 3 (HTN, DM II, CHF), recommend d/c IV heparin, start on eliquis 5mg  BID  - increase coreg to 12.5mg  BID, monitor for pauses.   - need NOAC for 3 weeks, before considering outpatient DCCV. Note patient has severe LAE, may have been in afib for a long time  - obtain TSH with morning labs.   3. Hypertension: hypertensive, increase coreg, continue BiDil, plan to add ACEI/ARB once finish diuresing   4. DM II: SSI    For questions or updates, please contact [place] Please consult [email or web address] for contact info under Cardiology/STEMI.   Hilbert Corrigan, [place]  11/21/2017 11:03 AM   History and all data above reviewed.  Patient  examined.  I agree with the findings as above.  He has not seen a physician in a couple of years.  He has had no meds or follow up because of cost.  He has no insurance.  He reports having been told about his weak heart years ago.  He reports chronic SOB but this became severe and trying to take trash out in the last couple of days he had to stop many times to get his breath.  He does not describe substernal pressure although there was a mild fullness when he would be SOB.  He did not notice palpitations and has never been told that he had atrial fib.  He has had increased edema again for some time.  He thinks he might have gained about 30 lbs but since he is not weighing himself I am not sure how long this has taken.  He does not use salt and cooks fresh. The patient exam reveals [id number]  ,  Lungs: Decreased breath sounds  ,  Abd: Morbidly obese, Ext Severe edema to the waste.   .  All available labs, radiology testing, previous records reviewed. Agree with documented assessment and plan. Acute systolic HF:  Etiology is not clear.  He might have HTN not controlled.  He does not drink.  We will need to exclude ischemic etiology as an outpatient although I don't suspect this.  For now meds as listed above.  We will try to get out patient records.  Of note:  He snores and will need a sleep apnea study at some point.  He needs to work with social workers to get help with medical coverage or disability.    Jeneen Rinks Lynsee Wands  12:08 PM  11/21/2017

## 2017-11-21 NOTE — Care Management Note (Signed)
Case Management Note  Patient Details  Name: Joe Blake MRN: [id number] Date of Birth: [birth year]  Subjective/Objective: Provided w/pcp listing-encouraged [facility]-patient will call to make own appt.Development worker, community following for [employer] process.                   Action/Plan:d/c plan home.   Expected Discharge Date:                  Expected Discharge Plan:  Home/Self Care  In-House Referral:     Discharge [facility]  Post Acute Care Choice:    Choice offered to:     DME Arranged:    DME Agency:     HH Arranged:    [place] Agency:     Status of Service:  In process, will continue to follow  If discussed at Long Length of Stay Meetings, dates discussed:    Additional Comments:  Dessa Phi, RN 11/21/2017, 2:06 PM

## 2017-11-21 NOTE — Progress Notes (Signed)
ANTICOAGULATION CONSULT NOTE - Follow Up Consult  Pharmacy Consult for heparin Indication: atrial fibrillation  Allergies  Allergen Reactions  . Cucumber Extract     Patient Measurements: Height: 6\' 1"  (185.4 cm) Weight: (!) 382 lb 9.6 oz (173.5 kg) IBW/kg (Calculated) : 79.9 Heparin Dosing Weight: 123 kg   Vital Signs: Temp: 97.7 F (36.5 C) (03/20 0559) Temp Source: Oral (03/20 0559) BP: 154/96 (03/20 0559) Pulse Rate: 99 (03/20 0559)  Labs: Recent Labs    11/19/17 2010 11/20/17 0153 11/20/17 0806 11/20/17 1621 11/21/17 0015 11/21/17 0544  HGB 13.5  --  12.5*  --   --  12.4*  HCT 41.2  --  41.0  --   --  40.9  PLT 249  --  260  --   --  251  APTT  --  153*  --   --   --   --   LABPROT  --  18.6*  --   --   --   --   INR  --  1.56  --   --   --   --   HEPARINUNFRC  --   --  0.17* 0.22* 0.56  --   CREATININE 1.55* 1.53*  --   --   --  1.34*  1.33*    Estimated Creatinine Clearance: 110.2 mL/min (A) (by C-G formula based on SCr of 1.33 mg/dL (H)).   Assessment: Patient is a 52 y.o M presented to the ED on 3/18 with c/o SOB and was found to be in afib. Chest CTA neg for PE.  Heparin drip started on admission for afib.  Today, 11/21/2017: -  Confirmatory Heparin level is therapeutic at 0.55 - CBC relatively stable - no bleeding documented   Goal of Therapy:  Heparin level 0.3-0.7 units/ml Monitor platelets by anticoagulation protocol: Yes   Plan:  - continue heparin drip at 2300 units/hr - daily heparin level - monitor for s/s bleeding - f/u with recommendations from cardiology team  Rucha Wissinger P 11/21/2017,8:40 AM

## 2017-11-21 NOTE — Progress Notes (Signed)
PROGRESS NOTE    Joe Blake  FBP:[id number] DOB: 13-Jun-1966 DOA: 11/19/2017 PCP: Patient, No Pcp Per  Outpatient Specialists:     Brief Narrative:  Patient is a 52 year old male, morbidly obese, with past medical history significant for congestive heart failure as per prior documentation, diabetes mellitus type 2 and hypertension.  Collateral information indicates that the patient has been off his medication for about 3-4 years.  The patient is admitted with shortness of breath, bilateral lower leg edema and weight gain over the last 12 days.  Serum creatinine is noted to be mildly elevated.  Patient be admitted for further assessment and management.  11/21/2017-echo revealed EF of 20 to 25%, severe diffuse hypokinesis with no identifiable regional variations, mild aortic regurgitation, mild mitral valve regurgitation, moderately dilated right ventricular cavity with mildly reduced systolic function, severely dilated right atrium and trivial pericardial effusion.  Fluid balance is greater than 4 L negative.  Swelling and redness of the lower extremity is significantly improved.  No significant change in patient's renal function.  We will continue to diurese the patient, and continue to monitor renal function and electrolytes.  Cardiology input is appreciated as well.  Patient's old record will be obtained.  Patient is still in atrial fibrillation, with intermittent rapid ventricular response, especially, with activity (this can also indicate severe deconditioning, as well as degree of cardiac decompensation).   Assessment & Plan:   Principal Problem:   Acute on chronic systolic CHF (congestive heart failure) (HCC) Active Problems:   New onset a-fib (HCC)   DM2 (diabetes mellitus, type 2) (HCC)   HTN (hypertension)   Obesity, Class III, BMI 40-49.9 (morbid obesity) (HCC)   CKD (chronic kidney disease) stage 3, GFR 30-59 ml/min (HCC)   LBBB (left bundle branch block)   Morbid obesity (HCC)  Acute systolic HF (heart failure) (HCC)   Atrial fibrillation (HCC)   Acute on chronic systolic congestive heart failure: -Echo done earlier today revealed EF of 20-25%.  Severe diffuse hypokinesis with no identifiable regional variations reported.  Mild regurgitation of the aortic valve, as well as mild regurgitation of the mitral valve, right atrium is said to be severely dilated, trivial pericardial fluid was identified.  Moderately dilated cavity of the right ventricle as well as mildly reduced systolic function of the right ventricle where reported. -Will change metoprolol to Coreg. -We will start patient on BiDil. -We will hold ACE inhibitor, ARB, Aldactone for now, due to patient's impaired renal function. -Likely consult the cardiology team in the morning. -Increase IV Lasix to 60 mg twice daily. -Monitor renal function and electrolytes closely. -Kindly see above.  Atrial fibrillation with rapid ventricular response: -Cardiology input is appreciated. -For DOAC, however, patient may be financially challenged. -Cardiology has recommended Eliquis 5 mg p.o. twice daily. -Patient is also on Coreg 12.5 mg p.o. twice daily  Diabetes mellitus: -Continue to optimize.  Hypertension, uncontrolled: -As documented above, will change metoprolol to Coreg, and will start at 6.25 mg p.o. twice daily. -We will add BiDil. -IV Lasix 60 in the twice daily. -Continue to monitor blood pressure, renal function and adjust accordingly.  We will  Chronic kidney disease stage III versus acute kidney injury versus acute kidney injury on chronic kidney disease: -I have not really visualized patient's prior renal function documentation. -With history of diabetes mellitus and hypertension, I suspect that this could be chronic kidney disease stage III.  However, the patient is also at risk for cardio renal syndrome.  EF is only  20-25%. -Introduce ACE inhibitor or ARB when possible.  Morbid obesity: -Diet  and exercise. -Encouraged patient to lose weight.  Possibly undiagnosed OSA and obesity hypoventilation syndrome: -Pursue outpatient sleep study.  Further management will depend on hospital course.  Will will get cardiology team to see the patient in the morning.  Patient's EF will need to be monitored.    DVT prophylaxis: Subcu heparin Code Status: Full Family Communication: None Disposition Plan: Will depend on hospital course   Consultants:   Likely consult cardiology in the morning  Procedures:   Echocardiogram (kindly see above)  Antimicrobials:   None   Subjective: Poor historian.  Admitted with shortness of breath and weight gain.  No chest pain.  Objective: Vitals:   11/21/17 0019 11/21/17 0500 11/21/17 0559 11/21/17 0608  BP: (!) 153/98  (!) 154/96   Pulse:   99   Resp:   20   Temp:   97.7 F (36.5 C)   TempSrc:   Oral   SpO2:   95%   Weight:  (!) 173.5 kg (382 lb 9.6 oz)  (!) 173.5 kg (382 lb 9.6 oz)  Height:        Intake/Output Summary (Last 24 hours) at 11/21/2017 1303 Last data filed at 11/21/2017 1242 Gross per 24 hour  Intake 1522.7 ml  Output 6620 ml  Net -5097.3 ml   Filed Weights   11/20/17 0520 11/21/17 0500 11/21/17 0608  Weight: (!) 176.9 kg (390 lb 1.6 oz) (!) 173.5 kg (382 lb 9.6 oz) (!) 173.5 kg (382 lb 9.6 oz)    Examination:  General exam: Appears calm and comfortable.  Morbidly obese Respiratory system: Decreased air entry globally.  C Cardiovascular system: S1 & S2  Gastrointestinal system: Abdomen is morbidly obese, soft and nontender.  Organs are difficult to assess.  Central nervous system: Alert and oriented. No focal neurological deficits. Extremities: Chronically looking bilateral lower extremity edema.  Skin changes lower legs.  Right lower leg looks more hyperemic than the left, but the patient reports that this is chronic (for over 5 years).   Skin: Bilateral, chronic, lower leg rash, worse on the right side there  is also a superficial cut on patient's right lower leg.   Data Reviewed: I have personally reviewed following labs and imaging studies  CBC: Recent Labs  Lab 11/19/17 2010 11/20/17 0806 11/21/17 0544  WBC 8.5 6.9 6.5  NEUTROABS 6.0  --   --   HGB 13.5 12.5* 12.4*  HCT 41.2 41.0 40.9  MCV 93.6 95.6 96.2  PLT 249 260 607   Basic Metabolic Panel: Recent Labs  Lab 11/19/17 2010 11/20/17 0153 11/21/17 0544  NA 145 143 141  142  K 4.2 3.6 3.6  3.6  CL 110 106 103  104  CO2 26 27 28  28   GLUCOSE 110* 128* 115*  115*  BUN 20 19 20  20   CREATININE 1.55* 1.53* 1.34*  1.33*  CALCIUM 8.9 8.8* 8.3*  8.5*  MG  --   --  1.9  PHOS  --   --  4.2   GFR: Estimated Creatinine Clearance: 110.2 mL/min (A) (by C-G formula based on SCr of 1.33 mg/dL (H)). Liver Function Tests: Recent Labs  Lab 11/19/17 2010 11/21/17 0544  AST 37  --   ALT <5*  --   ALKPHOS 84  --   BILITOT 1.0  --   PROT 6.9  --   ALBUMIN 3.7 3.3*   No results for input(s):  LIPASE, AMYLASE in the last 168 hours. No results for input(s): AMMONIA in the last 168 hours. Coagulation Profile: Recent Labs  Lab 11/20/17 0153  INR 1.56   Cardiac Enzymes: No results for input(s): CKTOTAL, CKMB, CKMBINDEX, TROPONINI in the last 168 hours. BNP (last 3 results) No results for input(s): PROBNP in the last 8760 hours. HbA1C: Recent Labs    11/20/17 0153  HGBA1C 5.9*   CBG: Recent Labs  Lab 11/20/17 1134 11/20/17 1655 11/20/17 2129 11/21/17 0741 11/21/17 1137  GLUCAP 104* 81 101* 97 147*   Lipid Profile: No results for input(s): CHOL, HDL, LDLCALC, TRIG, CHOLHDL, LDLDIRECT in the last 72 hours. Thyroid Function Tests: No results for input(s): TSH, T4TOTAL, FREET4, T3FREE, THYROIDAB in the last 72 hours. Anemia Panel: No results for input(s): VITAMINB12, FOLATE, FERRITIN, TIBC, IRON, RETICCTPCT in the last 72 hours. Urine analysis: No results found for: COLORURINE, APPEARANCEUR, LABSPEC, PHURINE,  GLUCOSEU, HGBUR, BILIRUBINUR, KETONESUR, PROTEINUR, UROBILINOGEN, NITRITE, LEUKOCYTESUR Sepsis Labs: @LABRCNTIP (procalcitonin:4,lacticidven:4)  )No results found for this or any previous visit (from the past 240 hour(s)).       Radiology Studies: Dg Chest 2 View  Result Date: 11/19/2017 CLINICAL DATA:  Labored breathing, lower extremity swelling. EXAM: CHEST - 2 VIEW COMPARISON:  None. FINDINGS: Cardiomegaly. Lungs are clear. No pleural effusion or pneumothorax seen. Osseous structures about the chest are unremarkable. IMPRESSION: Cardiomegaly. No acute findings. No evidence of pneumonia or pulmonary edema. Electronically Signed   By: Franki Cabot M.D.   On: 11/19/2017 19:52   Ct Angio Chest Pe W And/or Wo Contrast  Result Date: 11/19/2017 CLINICAL DATA:  PE suspected, intermediate prob, positive D-dimer. Labored breathing. Waking/fluid retention. History of CHF. EXAM: CT ANGIOGRAPHY CHEST WITH CONTRAST TECHNIQUE: Multidetector CT imaging of the chest was performed using the standard protocol during bolus administration of intravenous contrast. Multiplanar CT image reconstructions and MIPs were obtained to evaluate the vascular anatomy. CONTRAST:  130mL ISOVUE-370 IOPAMIDOL (ISOVUE-370) INJECTION 76% COMPARISON:  Chest radiograph earlier this day. FINDINGS: Cardiovascular: Breathing motion artifact partially limits assessment. There are no filling defects within the pulmonary arteries to suggest pulmonary embolus. Multi chamber cardiomegaly. Thoracic aorta is normal in caliber. Contrast refluxes into the hepatic veins and IVC. Minimal pericardial fluid, physiologic versus small effusion. Mediastinum/Nodes: Multiple small mediastinal nodes all subcentimeter. Prominent right hilar nodes measuring 13 mm short axis. Smaller left hilar nodes. Esophagus is decompressed. Lungs/Pleura: Small bilateral pleural effusions, right greater than left. Mild septal thickening at the lung bases. Heterogeneous  ground-glass opacities in the upper lobes, detail obscured by motion, nonspecific. No confluent consolidation. No pulmonary mass. Trachea and mainstem bronchi not well evaluated due to breathing motion expiratory imaging. Upper Abdomen: Mild contrast refluxing into the hepatic veins and IVC. Small amount of perihepatic ascites. Musculoskeletal: There are no acute or suspicious osseous abnormalities. Review of the MIP images confirms the above findings. IMPRESSION: 1. No pulmonary embolus. 2. Findings suggest mild CHF with small bilateral pleural effusions, mild pulmonary edema and cardiomegaly. Mild contrast refluxing into the hepatic veins and IVC. Small perihepatic ascites. 3. Heterogeneous attenuation/ground-glass opacities in the upper lobes, may be pulmonary edema but are nonspecific. Small airways disease is also considered. Breathing motion limits evaluation. 4. Mild mediastinal and right hilar adenopathy is likely reactive in the setting CHF Electronically Signed   By: Jeb Levering M.D.   On: 11/19/2017 22:46        Scheduled Meds: . carvedilol  12.5 mg Oral BID WC  . furosemide  60  mg Intravenous BID  . insulin aspart  0-15 Units Subcutaneous TID WC  . isosorbide-hydrALAZINE  1 tablet Oral TID  . sodium chloride flush  3 mL Intravenous Q12H   Continuous Infusions: . sodium chloride    . heparin 2,300 Units/hr (11/21/17 0913)     LOS: 2 days    Time spent: 35 minutes    Dana Allan, MD  [facility] Pager #: [phone] 7PM-7AM contact night coverage as above

## 2017-11-22 LAB — TSH: TSH: 4.111 u[IU]/mL (ref 0.350–4.500)

## 2017-11-22 LAB — RENAL FUNCTION PANEL
Albumin: 3.5 g/dL (ref 3.5–5.0)
Anion gap: 11 (ref 5–15)
BUN: 21 mg/dL — ABNORMAL HIGH (ref 6–20)
CO2: 28 mmol/L (ref 22–32)
Calcium: 8.6 mg/dL — ABNORMAL LOW (ref 8.9–10.3)
Chloride: 100 mmol/L — ABNORMAL LOW (ref 101–111)
Creatinine, Ser: 1.38 mg/dL — ABNORMAL HIGH (ref 0.61–1.24)
GFR calc Af Amer: >60 mL/min (ref >60)
GFR calc non Af Amer: 58 mL/min — ABNORMAL LOW (ref >60)
Glucose, Bld: 96 mg/dL (ref 65–99)
Phosphorus: 4 mg/dL (ref 2.5–4.6)
Potassium: 4.3 mmol/L (ref 3.5–5.1)
Sodium: 139 mmol/L (ref 135–145)

## 2017-11-22 LAB — CBC
HCT: 43 % (ref 39.0–52.0)
HEMOGLOBIN: 13.5 g/dL (ref 13.0–17.0)
MCH: 29.5 pg (ref 26.0–34.0)
MCHC: 31.4 g/dL (ref 30.0–36.0)
MCV: 94.1 fL (ref 78.0–100.0)
Platelets: 257 10*3/uL (ref 150–400)
RBC: 4.57 MIL/uL (ref 4.22–5.81)
RDW: 15.3 % (ref 11.5–15.5)
WBC: 7.3 10*3/uL (ref 4.0–10.5)

## 2017-11-22 LAB — GLUCOSE, CAPILLARY
GLUCOSE-CAPILLARY: 174 mg/dL — AB (ref 65–99)
Glucose-Capillary: 97 mg/dL (ref 65–99)

## 2017-11-22 MED ORDER — COUMADIN BOOK
Freq: Once | Status: AC
  Administered 2017-11-22: 22:00:00
  Filled 2017-11-22 (×2): qty 1

## 2017-11-22 MED ORDER — WARFARIN VIDEO: Freq: Once | Status: DC

## 2017-11-22 MED ORDER — WARFARIN SODIUM 5 MG PO TABS
10.0000 mg | ORAL_TABLET | Freq: Once | ORAL | Status: AC
  Administered 2017-11-22: 10 mg via ORAL
  Filled 2017-11-22: qty 2

## 2017-11-22 MED ORDER — WARFARIN - PHARMACIST DOSING INPATIENT
Freq: Every day | Status: DC
  Administered 2017-11-25: 18:00:00

## 2017-11-22 MED ORDER — APIXABAN 5 MG PO TABS
5.0000 mg | ORAL_TABLET | Freq: Two times a day (BID) | ORAL | Status: DC
  Administered 2017-11-22: 5 mg via ORAL
  Filled 2017-11-22 (×2): qty 1

## 2017-11-22 NOTE — Progress Notes (Signed)
ANTICOAGULATION CONSULT NOTE - Initial Consult  Pharmacy Consult for Eliquis  Indication: atrial fibrillation  Allergies  Allergen Reactions  . Cucumber Extract     Patient Measurements: Height: 6\' 1"  (185.4 cm) Weight: (!) 382 lb 9.6 oz (173.5 kg) IBW/kg (Calculated) : 79.9 Heparin Dosing Weight:   Vital Signs: Temp: 97.7 F (36.5 C) (03/20 2105) Temp Source: Oral (03/20 2105) BP: 117/64 (03/20 2105) Pulse Rate: 86 (03/20 2105)  Labs: Recent Labs    11/19/17 2010 11/20/17 0153  11/20/17 0806 11/20/17 1621 11/21/17 0015 11/21/17 0544 11/21/17 0920  HGB 13.5  --   --  12.5*  --   --  12.4*  --   HCT 41.2  --   --  41.0  --   --  40.9  --   PLT 249  --   --  260  --   --  251  --   APTT  --  153*  --   --   --   --   --   --   LABPROT  --  18.6*  --   --   --   --   --   --   INR  --  1.56  --   --   --   --   --   --   HEPARINUNFRC  --   --    < > 0.17* 0.22* 0.56  --  0.55  CREATININE 1.55* 1.53*  --   --   --   --  1.34*  1.33*  --    < > = values in this interval not displayed.    Estimated Creatinine Clearance: 110.2 mL/min (A) (by C-G formula based on SCr of 1.33 mg/dL (H)).   Medical History: Past Medical History:  Diagnosis Date  . Acute systolic HF (heart failure) ([place])   . Atrial fibrillation ([place])   . Diabetes mellitus without complication ([place])   . Hypertension   . Morbid obesity (HCC)     Medications:  Scheduled:  . apixaban  5 mg Oral BID  . carvedilol  12.5 mg Oral BID WC  . furosemide  60 mg Intravenous BID  . insulin aspart  0-15 Units Subcutaneous TID WC  . isosorbide-hydrALAZINE  1 tablet Oral TID  . sodium chloride flush  3 mL Intravenous Q12H   Infusions:  . sodium chloride      Assessment: Patient is a 52 y.o M presented to the ED on 3/18 with c/o SOB and was found to be in afib. Chest CTA neg for PE.  Heparin drip started on admission for afib.Now MD transitioning patient to Eliquis per Rx   Plan:  Stop Heparin drip at  0500 Give 5 mg Eliquis at 0500 then 5 mg twice daily   Lawana Pai R 11/22/2017,4:58 AM

## 2017-11-22 NOTE — Progress Notes (Signed)
PROGRESS NOTE    Joe Blake  GQQ:[id number] DOB: [birth year] DOA: 11/19/2017 PCP: Patient, No Pcp Per  Outpatient Specialists:     Brief Narrative:  Patient is a 52 year old male, morbidly obese, with past medical history significant for congestive heart failure as per prior documentation, diabetes mellitus type 2 and hypertension.  Collateral information indicates that the patient has been off his medication for about 3-4 years.  The patient is admitted with shortness of breath, bilateral lower leg edema and weight gain over the last 12 days.  Serum creatinine is noted to be mildly elevated.  Patient be admitted for further assessment and management.  11/21/2017-echo revealed EF of 20 to 25%, severe diffuse hypokinesis with no identifiable regional variations, mild aortic regurgitation, mild mitral valve regurgitation, moderately dilated right ventricular cavity with mildly reduced systolic function, severely dilated right atrium and trivial pericardial effusion.  Fluid balance is greater than 4 L negative.  Swelling and redness of the lower extremity is significantly improved.  No significant change in patient's renal function.  We will continue to diurese the patient, and continue to monitor renal function and electrolytes.  Cardiology input is appreciated as well.  Patient's old record will be obtained.  Patient is still in atrial fibrillation, with intermittent rapid ventricular response, especially, with activity (this can also indicate severe deconditioning, as well as degree of cardiac decompensation).  11/22/2017: Patient seen.  Patient continues to improve.  Anticoagulation will be changed to Coumadin.  Records from [place] is still been awaited.  Cardiology input is highly appreciated.   Assessment & Plan:   Principal Problem:   Acute on chronic systolic CHF (congestive heart failure) (HCC) Active Problems:   New onset a-fib (HCC)   DM2 (diabetes mellitus, type 2) (HCC)   HTN  (hypertension)   Obesity, Class III, BMI 40-49.9 (morbid obesity) (HCC)   CKD (chronic kidney disease) stage 3, GFR 30-59 ml/min (HCC)   LBBB (left bundle branch block)   Morbid obesity (HCC)   Acute systolic HF (heart failure) (HCC)   Atrial fibrillation (HCC)   Acute on chronic systolic congestive heart failure: -Echo done earlier today revealed EF of 20-25%.  Severe diffuse hypokinesis with no identifiable regional variations reported.  Mild regurgitation of the aortic valve, as well as mild regurgitation of the mitral valve, right atrium is said to be severely dilated, trivial pericardial fluid was identified.  Moderately dilated cavity of the right ventricle as well as mildly reduced systolic function of the right ventricle where reported. -Will change metoprolol to Coreg. -We will start patient on BiDil. -We will hold ACE inhibitor, ARB, Aldactone for now, due to patient's impaired renal function. -Likely consult the cardiology team in the morning. -Continue IV Lasix to 60 mg twice daily. -Monitor renal function and electrolytes closely. -Kindly see above.  Atrial fibrillation with rapid ventricular response: -Cardiology input is appreciated. -For Coumadin.  -Patient is also on Coreg 12.5 mg p.o. twice daily  Diabetes mellitus: -Continue to optimize.  Hypertension, uncontrolled: -As documented above, will change metoprolol to Coreg, and will start at 6.25 mg p.o. twice daily. -We will add BiDil. -IV Lasix 60 in the twice daily. -Continue to monitor blood pressure, renal function and adjust accordingly.  We will  Chronic kidney disease stage III versus acute kidney injury versus acute kidney injury on chronic kidney disease: -I have not really visualized patient's prior renal function documentation. -With history of diabetes mellitus and hypertension, I suspect that this could be chronic kidney disease  stage III.  However, the patient is also at risk for cardio renal syndrome.   EF is only 20-25%. -Introduce ACE inhibitor or ARB when possible.  Morbid obesity: -Diet and exercise. -Encouraged patient to lose weight.  Possibly undiagnosed OSA and obesity hypoventilation syndrome: -Pursue outpatient sleep study.  Further management will depend on hospital course.  Will will get cardiology team to see the patient in the morning.  Patient's EF will need to be monitored.    DVT prophylaxis: Subcu heparin Code Status: Full Family Communication: None Disposition Plan: Will depend on hospital course   Consultants:   Cardiology   Procedures:   Echocardiogram (kindly see above)  Antimicrobials:   None   Subjective: No new complaints.  Patient has not back to baseline.  Patient continues to improve.    Objective: Vitals:   11/21/17 1437 11/21/17 2105 11/22/17 0529 11/22/17 1536  BP: (!) 147/86 117/64 (!) 144/98 (!) 126/93  Pulse: 99 86 83 86  Resp: 18 18 18 19   Temp: 97.9 F (36.6 C) 97.7 F (36.5 C) 97.6 F (36.4 C) 98.5 F (36.9 C)  TempSrc: Oral Oral Oral Oral  SpO2: 99% 97% 96% 99%  Weight:   (!) 170.3 kg (375 lb 7.1 oz)   Height:        Intake/Output Summary (Last 24 hours) at 11/22/2017 2010 Last data filed at 11/22/2017 1941 Gross per 24 hour  Intake 881 ml  Output 4301 ml  Net -3420 ml   Filed Weights   11/21/17 0500 11/21/17 0608 11/22/17 0529  Weight: (!) 173.5 kg (382 lb 9.6 oz) (!) 173.5 kg (382 lb 9.6 oz) (!) 170.3 kg (375 lb 7.1 oz)    Examination:  General exam: Appears calm and comfortable.  Morbidly obese Respiratory system: Decreased air entry globally.  C Cardiovascular system: S1 & S2  Gastrointestinal system: Abdomen is morbidly obese, soft and nontender.  Organs are difficult to assess.  Central nervous system: Alert and oriented. No focal neurological deficits. Extremities: Chronically looking bilateral lower extremity edema.  Skin changes lower legs.  Right lower leg looks more hyperemic than the left, but  the patient reports that this is chronic (for over 5 years).   Skin: Bilateral, chronic, lower leg rash, worse on the right side there is also a superficial cut on patient's right lower leg.   Data Reviewed: I have personally reviewed following labs and imaging studies  CBC: Recent Labs  Lab 11/19/17 2010 11/20/17 0806 11/21/17 0544 11/22/17 0549  WBC 8.5 6.9 6.5 7.3  NEUTROABS 6.0  --   --   --   HGB 13.5 12.5* 12.4* 13.5  HCT 41.2 41.0 40.9 43.0  MCV 93.6 95.6 96.2 94.1  PLT 249 260 251 557   Basic Metabolic Panel: Recent Labs  Lab 11/19/17 2010 11/20/17 0153 11/21/17 0544 11/22/17 0549  NA 145 143 141  142 139  K 4.2 3.6 3.6  3.6 4.3  CL 110 106 103  104 100*  CO2 26 27 28  28 28   GLUCOSE 110* 128* 115*  115* 96  BUN 20 19 20  20  21*  CREATININE 1.55* 1.53* 1.34*  1.33* 1.38*  CALCIUM 8.9 8.8* 8.3*  8.5* 8.6*  MG  --   --  1.9  --   PHOS  --   --  4.2 4.0   GFR: Estimated Creatinine Clearance: 105.2 mL/min (A) (by C-G formula based on SCr of 1.38 mg/dL (H)). Liver Function Tests: Recent Labs  Lab  11/19/17 2010 11/21/17 0544 11/22/17 0549  AST 37  --   --   ALT <5*  --   --   ALKPHOS 84  --   --   BILITOT 1.0  --   --   PROT 6.9  --   --   ALBUMIN 3.7 3.3* 3.5   No results for input(s): LIPASE, AMYLASE in the last 168 hours. No results for input(s): AMMONIA in the last 168 hours. Coagulation Profile: Recent Labs  Lab 11/20/17 0153  INR 1.56   Cardiac Enzymes: No results for input(s): CKTOTAL, CKMB, CKMBINDEX, TROPONINI in the last 168 hours. BNP (last 3 results) No results for input(s): PROBNP in the last 8760 hours. HbA1C: Recent Labs    11/20/17 0153  HGBA1C 5.9*   CBG: Recent Labs  Lab 11/21/17 1137 11/21/17 1610 11/21/17 2102 11/22/17 0742 11/22/17 1100  GLUCAP 147* 90 91 97 174*   Lipid Profile: No results for input(s): CHOL, HDL, LDLCALC, TRIG, CHOLHDL, LDLDIRECT in the last 72 hours. Thyroid Function Tests: Recent  Labs    11/22/17 0606  TSH 4.111   Anemia Panel: No results for input(s): VITAMINB12, FOLATE, FERRITIN, TIBC, IRON, RETICCTPCT in the last 72 hours. Urine analysis: No results found for: COLORURINE, APPEARANCEUR, LABSPEC, PHURINE, GLUCOSEU, HGBUR, BILIRUBINUR, KETONESUR, PROTEINUR, UROBILINOGEN, NITRITE, LEUKOCYTESUR Sepsis Labs: @LABRCNTIP (procalcitonin:4,lacticidven:4)  )No results found for this or any previous visit (from the past 240 hour(s)).       Radiology Studies: No results found.      Scheduled Meds: . carvedilol  12.5 mg Oral BID WC  . coumadin book   Does not apply Once  . furosemide  60 mg Intravenous BID  . isosorbide-hydrALAZINE  1 tablet Oral TID  . sodium chloride flush  3 mL Intravenous Q12H  . warfarin   Does not apply Once  . Warfarin - Pharmacist Dosing Inpatient   Does not apply q1800   Continuous Infusions: . sodium chloride       LOS: 3 days    Time spent: 35 minutes    Dana Allan, MD  [facility] Pager #: [phone] 7PM-7AM contact night coverage as above

## 2017-11-22 NOTE — Progress Notes (Addendum)
Progress Note  Patient Name: Joe Blake Date of Encounter: 11/22/2017  Primary Cardiologist: Dr Percival Spanish  Subjective   He thinks SOB is improving  Inpatient Medications    Scheduled Meds: . apixaban  5 mg Oral BID  . carvedilol  12.5 mg Oral BID WC  . furosemide  60 mg Intravenous BID  . insulin aspart  0-15 Units Subcutaneous TID WC  . isosorbide-hydrALAZINE  1 tablet Oral TID  . sodium chloride flush  3 mL Intravenous Q12H   Continuous Infusions: . sodium chloride     PRN Meds: sodium chloride, acetaminophen, ondansetron (ZOFRAN) IV, sodium chloride flush   Vital Signs    Vitals:   11/21/17 0608 11/21/17 1437 11/21/17 2105 11/22/17 0529  BP:  (!) 147/86 117/64 (!) 144/98  Pulse:  99 86 83  Resp:  18 18 18   Temp:  97.9 F (36.6 C) 97.7 F (36.5 C) 97.6 F (36.4 C)  TempSrc:  Oral Oral Oral  SpO2:  99% 97% 96%  Weight: (!) 382 lb 9.6 oz (173.5 kg)   (!) 375 lb 7.1 oz (170.3 kg)  Height:        Intake/Output Summary (Last 24 hours) at 11/22/2017 0950 Last data filed at 11/22/2017 0200 Gross per 24 hour  Intake 940 ml  Output 4050 ml  Net -3110 ml   Filed Weights   11/21/17 0500 11/21/17 0608 11/22/17 0529  Weight: (!) 382 lb 9.6 oz (173.5 kg) (!) 382 lb 9.6 oz (173.5 kg) (!) 375 lb 7.1 oz (170.3 kg)    Telemetry    AF with variable VR- at times 100, other times in the 40's - Personally Reviewed  ECG    None new - Personally Reviewed  Physical Exam   GEN:  Morbidly obese Caucasian male, no acute distress.   Neck: No JVD Cardiac: RRR, no murmurs, rubs, or gallops.  Respiratory: Clear to auscultation bilaterally. GI:  obese, soft, nontender, non-distended  MS:  both lower extremities red and warm to touch Neuro:  Nonfocal  Psych: Normal affect   Labs    Chemistry Recent Labs  Lab 11/19/17 2010 11/20/17 0153 11/21/17 0544 11/22/17 0549  NA 145 143 141  142 139  K 4.2 3.6 3.6  3.6 4.3  CL 110 106 103  104 100*  CO2 26 27 28  28 28     GLUCOSE 110* 128* 115*  115* 96  BUN 20 19 20  20  21*  CREATININE 1.55* 1.53* 1.34*  1.33* 1.38*  CALCIUM 8.9 8.8* 8.3*  8.5* 8.6*  PROT 6.9  --   --   --   ALBUMIN 3.7  --  3.3* 3.5  AST 37  --   --   --   ALT <5*  --   --   --   ALKPHOS 84  --   --   --   BILITOT 1.0  --   --   --   GFRNONAA 51* 51* >60  >60 58*  GFRAA 59* 60* >60  >60 >60  ANIONGAP 9 10 10  10 11      Hematology Recent Labs  Lab 11/20/17 0806 11/21/17 0544 11/22/17 0549  WBC 6.9 6.5 7.3  RBC 4.29 4.25 4.57  HGB 12.5* 12.4* 13.5  HCT 41.0 40.9 43.0  MCV 95.6 96.2 94.1  MCH 29.1 29.2 29.5  MCHC 30.5 30.3 31.4  RDW 15.5 15.3 15.3  PLT 260 251 257    Cardiac EnzymesNo results for input(s): TROPONINI in  the last 168 hours.  Recent Labs  Lab 11/19/17 2017  TROPIPOC 0.08     BNP Recent Labs  Lab 11/19/17 2010  BNP 248.5*     DDimer  Recent Labs  Lab 11/19/17 2010  DDIMER 1.21*     Radiology    CXR 11/19/17- CHEST - 2 VIEW  COMPARISON:  None.  FINDINGS: Cardiomegaly. Lungs are clear. No pleural effusion or pneumothorax seen. Osseous structures about the chest are unremarkable.  IMPRESSION: Cardiomegaly. No acute findings. No evidence of pneumonia or pulmonary edema.   Cardiac Studies   Echo 11/20/17- Study Conclusions  - Left ventricle: The cavity size was moderately dilated. Wall   thickness was increased in a pattern of mild LVH. Left   ventricular geometry showed evidence of eccentric hypertrophy.   Systolic function was severely reduced. The estimated ejection   fraction was in the range of 20% to 25%. Severe diffuse   hypokinesis with no identifiable regional variations. Acoustic   contrast opacification revealed no evidence ofthrombus. - Aortic valve: There was mild regurgitation. - Mitral valve: There was mild regurgitation directed centrally. - Left atrium: The atrium was severely dilated. - Right ventricle: The cavity size was moderately dilated.  Systolic   function was mildly reduced. - Right atrium: The atrium was severely dilated. - Pericardium, extracardiac: A trivial pericardial effusion was   identified.    Patient Profile     52 y.o.morbidly obese (BMI 26) male with a history of HTN and DM admitted 11/19/17 with DOE and was found to have AF with RVR and CHF.   Assessment & Plan    1.;  Acute systolic heart failure: EF 20-25%. Etiology not yet determined, presumably NICM.  He was admitted at [facility] in [place] in 2013 and was told his heart was pumping at 33%.   No history of cath, MI and he is a poor candidate for Myoview. He is diuresing. I/O neg 8.275 L.            2. Newly diagnosed atrial fibrillation with RVR: He was not aware of previous diagnosis of atrial fibrillation. His CHA2DS-Vasc score is 3 (HTN, DM II, CHF). He was started on Eliquis but Pharmacy has suggested Coumadin as they are concerned Eliquis will not be effective secondary to BMI of 50.   3.    Hypertension: B/P still elevated on Coreg and BiDil, consider addition of Amlodipine.  4. DM II: SSI - per primary service  5.   Renal insufficiency- SCr improving, consider ACE challenge before discharge.  6.    Morbid obesity- suspect he has sleep apnea which is contributing to his variable HR. Would not increase medication for rate control.    Plan:   -Once atrial fibrillation is under control and heart failure medication maximized and pt is cardioverted will repeat echocardiogram in 3 months. If his EF remains low, and he renal function is stable, would cardiac catheterization.  (Pending our review of the [facility] records.).    For questions or updates, please contact [place] Please consult [email or web address] for contact info under Cardiology/STEMI.      Signed, Kerin Ransom, PA-C  11/22/2017, 9:50 AM    History and all data above reviewed.  Patient examined.  I agree with the findings as above.  He thinks that he is breathing better.     The patient exam reveals HDQ:[id number]  ,  Lungs: Clear  ,  Abd: Positive bowel sounds, no rebound no guarding, Ext Severe  edema  .  All available labs, radiology testing, previous records reviewed. Agree with documented assessment and plan. Atrial fib:  Will switch to warfarin per pharmacy suggestion.  This will be better because it will give [place] the opportunity to follow him for compliance with anticoagulation prior to cardioversion.  Likely needs several days of IV diuresis.  I am trying to convince him to keep his head back and his feet up.    Jeneen [facility]  10:47 AM  11/22/2017

## 2017-11-22 NOTE — Discharge Instructions (Addendum)

## 2017-11-22 NOTE — Progress Notes (Signed)
Joe Blake for warfarin Indication: atrial fibrillation  Allergies  Allergen Reactions  . Cucumber Extract     Patient Measurements: Height: 6\' 1"  (185.4 cm) Weight: (!) 375 lb 7.1 oz (170.3 kg) IBW/kg (Calculated) : 79.9 Heparin Dosing Weight:  Vital Signs: Temp: 97.6 F (36.4 C) (03/21 0529) Temp Source: Oral (03/21 0529) BP: 144/98 (03/21 0529) Pulse Rate: 83 (03/21 0529)  Labs: Recent Labs    11/20/17 0153  11/20/17 9562 11/20/17 1621 11/21/17 0015 11/21/17 0544 11/21/17 0920 11/22/17 0549  HGB  --   --  12.5*  --   --  12.4*  --  13.5  HCT  --   --  41.0  --   --  40.9  --  43.0  PLT  --   --  260  --   --  251  --  257  APTT 153*  --   --   --   --   --   --   --   LABPROT 18.6*  --   --   --   --   --   --   --   INR 1.56  --   --   --   --   --   --   --   HEPARINUNFRC  --    < > 0.17* 0.22* 0.56  --  0.55  --   CREATININE 1.53*  --   --   --   --  1.34*  1.33*  --  1.38*   < > = values in this interval not displayed.    Estimated Creatinine Clearance: 105.2 mL/min (A) (by C-G formula based on SCr of 1.38 mg/dL (H)).   Assessment: Patient is 52 y.o M presented to the ED on 3/18 with c/o SOB and was found to be in afib. Chest CTA neg for PE.  Heparin drip started on admission for afib. Cardiology team indicates plan is to anticoagulate patient for 3 weeks prior to DCCV.  Patient was transitioned to Eliquis in AM of 3/21 with dose given at 0500.  However, due to high BMI and to ensure med compliance prior to cardioversion, will transition to warfarin as this can be monitored using INR. Per Dr. Percival Spanish, no bridging is needed.  Today, 11/22/2017: - CBC relatively stable - no bleeding documented   Goal of Therapy:  INR 2-3 Monitor platelets by anticoagulation protocol: Yes   Plan:  - warfarin 10 mg PO x1 today - daily INR (will start on 3/22 since patient got Eliquis dose this AM and this can elevated INR value) -  monitor for s/s bleeding  Joe Blake P 11/22/2017,1:02 PM

## 2017-11-22 NOTE — Progress Notes (Signed)
Physical Therapy Evaluation Patient Details Name: Joe Blake MRN: [id number] DOB: 1965/10/07 Today's Date: 11/22/2017   History of Present Illness  Patient is a 52 year old male, morbidly obese, with past medical history significant for congestive heart failure as per prior documentation, diabetes mellitus type 2 and hypertension.  Collateral information indicates that the patient has been off his medication for about 3-4 years.  The patient is admitted with shortness of breath, bilateral lower leg edema and weight gain over the last 12 days.  Serum creatinine is noted to be mildly elevated.  Patient be admitted for further assessment and management.  11/21/2017-echo revealed EF of 20 to 25%, severe diffuse hypokinesis with no identifiable regional variations, mild aortic regurgitation, mild mitral valve regurgitation, moderately dilated right ventricular cavity with mildly reduced systolic function, severely dilated right atrium and trivial pericardial effusion.  Fluid balance is greater than 4 L negative.  Swelling and redness of the lower extremity is significantly improved.  No significant change in patient's renal function.  We will continue to diurese the patient, and continue to monitor renal function and electrolytes.  Cardiology input is appreciated as well.  Patient's old record will be obtained.  Patient is still in atrial fibrillation, with intermittent rapid ventricular response, especially, with activity (this can also indicate severe deconditioning, as well as degree of cardiac decompensation).    Clinical Impression  Patient evaluated by Physical Therapy with no further acute PT needs identified. All education has been completed and the patient has no further questions. Patient would benefit from ambulation with nursing. Patient was able to ambulate 200 feet and go up and down 12 stairs with one handrail. PT is signing off. Thank you for this referral.     Follow Up Recommendations No PT  follow up    Equipment Recommendations  None recommended by PT    Recommendations for Other Services       Precautions / Restrictions Restrictions Weight Bearing Restrictions: No      Mobility  Bed Mobility Overal bed mobility: Independent                Transfers Overall transfer level: Independent                  Ambulation/Gait Ambulation/Gait assistance: Supervision Ambulation Distance (Feet): 200 Feet Assistive device: None Gait Pattern/deviations: Step-through pattern;Decreased step length - right;Decreased step length - left;Decreased stride length     General Gait Details: quite talkative during ambulation with arm and head movement perturbing balanceand frequently stopped in hallway to talk; may have been due to mild SOB.  Stairs Stairs: Yes Stairs assistance: Supervision Stair Management: One rail Right Number of Stairs: 12 General stair comments: supervision for safety  Wheelchair Mobility    Modified Rankin (Stroke Patients Only)       Balance Overall balance assessment: Independent                                           Pertinent Vitals/Pain Pain Assessment: No/denies pain    Home Living Family/patient expects to be discharged to:: Private residence Living Arrangements: Other relatives(brother and sister-in-law) Available Help at Discharge: Family Type of Home: House Home Access: Stairs to enter   Technical brewer of Steps: 1 Home Layout: One level Home Equipment: None      Prior Function Level of Independence: Independent  Hand Dominance        Extremity/Trunk Assessment   Upper Extremity Assessment Upper Extremity Assessment: Overall WFL for tasks assessed    Lower Extremity Assessment Lower Extremity Assessment: Overall WFL for tasks assessed       Communication   Communication: No difficulties  Cognition Arousal/Alertness: Awake/alert Behavior During  Therapy: WFL for tasks assessed/performed Overall Cognitive Status: Within Functional Limits for tasks assessed                                        General Comments      Exercises     Assessment/Plan    PT Assessment Patent does not need any further PT services  PT Problem List         PT Treatment Interventions      PT Goals (Current goals can be found in the Care Plan section)  Acute Rehab PT Goals Patient Stated Goal: Return home to remodel bathroom. PT Goal Formulation: With patient Time For Goal Achievement: 11/29/17 Potential to Achieve Goals: Good    Frequency     Barriers to discharge        Co-evaluation               AM-PAC PT "6 Clicks" Daily Activity  Outcome Measure Difficulty turning over in bed (including adjusting bedclothes, sheets and blankets)?: None Difficulty moving from lying on back to sitting on the side of the bed? : None Difficulty sitting down on and standing up from a chair with arms (e.g., wheelchair, bedside commode, etc,.)?: None Help needed moving to and from a bed to chair (including a wheelchair)?: None Help needed walking in hospital room?: None Help needed climbing 3-5 steps with a railing? : A Little 6 Click Score: 23    End of Session   Activity Tolerance: Patient tolerated treatment well Patient left: in chair;with call bell/phone within reach Nurse Communication: Mobility status PT Visit Diagnosis: Unsteadiness on feet (R26.81);Other abnormalities of gait and mobility (R26.89);Dizziness and giddiness (R42)    Time: 1443-1540 PT Time Calculation (min) (ACUTE ONLY): 31 min   Charges:   PT Evaluation $PT Eval Moderate Complexity: 1 Mod PT Treatments $Gait Training: 8-22 mins   PT G Codes:        Lyrik Dockstader D. Hartnett-Rands, MS, PT Per [place] #[id number] 11/22/2017, 12:40 PM

## 2017-11-23 LAB — RENAL FUNCTION PANEL
Albumin: 3.2 g/dL — ABNORMAL LOW (ref 3.5–5.0)
Anion gap: 9 (ref 5–15)
BUN: 24 mg/dL — ABNORMAL HIGH (ref 6–20)
CO2: 32 mmol/L (ref 22–32)
Calcium: 8.8 mg/dL — ABNORMAL LOW (ref 8.9–10.3)
Chloride: 99 mmol/L — ABNORMAL LOW (ref 101–111)
Creatinine, Ser: 1.39 mg/dL — ABNORMAL HIGH (ref 0.61–1.24)
GFR calc Af Amer: >60 mL/min (ref >60)
GFR calc non Af Amer: 58 mL/min — ABNORMAL LOW (ref >60)
Glucose, Bld: 109 mg/dL — ABNORMAL HIGH (ref 65–99)
Phosphorus: 3.9 mg/dL (ref 2.5–4.6)
Potassium: 3.7 mmol/L (ref 3.5–5.1)
Sodium: 140 mmol/L (ref 135–145)

## 2017-11-23 LAB — PROTIME-INR
INR: 1.3
Prothrombin Time: 16.1 seconds — ABNORMAL HIGH (ref 11.4–15.2)

## 2017-11-23 MED ORDER — LISINOPRIL 5 MG PO TABS
2.5000 mg | ORAL_TABLET | Freq: Every day | ORAL | Status: DC
  Administered 2017-11-23: 2.5 mg via ORAL
  Filled 2017-11-23 (×2): qty 1

## 2017-11-23 MED ORDER — WARFARIN SODIUM 5 MG PO TABS
10.0000 mg | ORAL_TABLET | Freq: Once | ORAL | Status: AC
  Administered 2017-11-23: 10 mg via ORAL
  Filled 2017-11-23: qty 2

## 2017-11-23 MED ORDER — CARVEDILOL 6.25 MG PO TABS
6.2500 mg | ORAL_TABLET | Freq: Two times a day (BID) | ORAL | Status: DC
  Administered 2017-11-23 – 2017-11-26 (×6): 6.25 mg via ORAL
  Filled 2017-11-23 (×6): qty 1

## 2017-11-23 NOTE — Progress Notes (Signed)
Patient C/O of dizziness about an hour after taking his medication and he stated he usually feels that way as well at home after taking his medications. Vital 120/68, 87. Hammond-NP notified and also informed of the 3.19sec pause he had earlier this morning. Will continue to assess patient.

## 2017-11-23 NOTE — Progress Notes (Signed)
Patient still dizzy, rechecked vitals 95/50, 85 and 96%-RA, cardiology Hammond-NP on the unit informed and going to assess patient.

## 2017-11-23 NOTE — Plan of Care (Signed)
Nutrition Education Note  RD consulted for nutrition education regarding new onset CHF.  Pt states he typically consumes 2 meals daily as he is not hungry when he first wakes up. A typical lunch might consist of a cup of homemade stew (no salt added) or a peanut butter and jelly sandwich. Pt does not have deli meat sandwiches as often as he used to.  RD provided "Heart Failure Nutrition Therapy" handout from the Academy of [employer]. Reviewed patient's dietary recall. Provided examples on ways to decrease sodium intake in diet. Discouraged intake of processed foods and use of salt shaker. Encouraged fresh fruits and vegetables as well as whole grain sources of carbohydrates to maximize fiber intake.   RD discussed why it is important for patient to adhere to diet recommendations, and emphasized the role of fluids, foods to avoid, and importance of weighing self daily. Teach back method used.  Expect good compliance.  Body mass index is 48.75 kg/m. Pt meets criteria for Morbid Obesity based on current BMI.  Current diet order is Heart Healthy, patient is consuming approximately 100% of meals at this time (per pt report as chart shows 0% meal completion). Labs and medications reviewed. No further nutrition interventions warranted at this time. RD contact information provided. If additional nutrition issues arise, please re-consult RD.   Gaynell Face, MS, RD, LDN Pager: [phone] Weekend/After Hours: [phone]

## 2017-11-23 NOTE — Progress Notes (Signed)
CMT alerted pt had 3.19 second pause. Pt asleep at this time. Other VSS. HR back up to 80s-90s. On call provider made aware. Will continue to monitor closely.

## 2017-11-23 NOTE — Progress Notes (Signed)
PROGRESS NOTE    Joe Blake  DDU:[id number] DOB: [birth year] DOA: 11/19/2017 PCP: Patient, No Pcp Per  Outpatient Specialists:     Brief Narrative:  Patient is a 52 year old male, morbidly obese, with past medical history significant for congestive heart failure as per prior documentation, diabetes mellitus type 2 and hypertension.  Collateral information indicates that the patient has been off his medication for about 3-4 years.  The patient is admitted with shortness of breath, bilateral lower leg edema and weight gain over the last 12 days.  Serum creatinine is noted to be mildly elevated.  Patient be admitted for further assessment and management.  11/21/2017-echo revealed EF of 20 to 25%, severe diffuse hypokinesis with no identifiable regional variations, mild aortic regurgitation, mild mitral valve regurgitation, moderately dilated right ventricular cavity with mildly reduced systolic function, severely dilated right atrium and trivial pericardial effusion.  Fluid balance is greater than 4 L negative.  Swelling and redness of the lower extremity is significantly improved.  No significant change in patient's renal function.  We will continue to diurese the patient, and continue to monitor renal function and electrolytes.  Cardiology input is appreciated as well.  Patient's old record will be obtained.  Patient is still in atrial fibrillation, with intermittent rapid ventricular response, especially, with activity (this can also indicate severe deconditioning, as well as degree of cardiac decompensation).  11/22/2017: Patient seen.  Patient continues to improve.  Anticoagulation will be changed to Coumadin.  Records from [place] is still been awaited.  Cardiology input is highly appreciated.  11/23/2017: Patient seen.  Patient continues to improve.  As per nursing staff, significant bradycardia and possible pause noted last night.  Cardiology input is appreciated.  Coreg has been decreased from 12.5  mg p.o. twice daily to 6.25 mg p.o. twice daily.    Assessment & Plan:   Principal Problem:   Acute on chronic systolic CHF (congestive heart failure) (HCC) Active Problems:   New onset a-fib (HCC)   DM2 (diabetes mellitus, type 2) (HCC)   HTN (hypertension)   Obesity, Class III, BMI 40-49.9 (morbid obesity) (HCC)   CKD (chronic kidney disease) stage 3, GFR 30-59 ml/min (HCC)   LBBB (left bundle branch block)   Morbid obesity (HCC)   Acute systolic HF (heart failure) (HCC)   Atrial fibrillation (HCC)   Acute on chronic systolic congestive heart failure: -Echo done earlier today revealed EF of 20-25%.  Severe diffuse hypokinesis with no identifiable regional variations reported.  Mild regurgitation of the aortic valve, as well as mild regurgitation of the mitral valve, right atrium is said to be severely dilated, trivial pericardial fluid was identified.  Moderately dilated cavity of the right ventricle as well as mildly reduced systolic function of the right ventricle where reported. -Coreg decreased from 12.5 mg p.o. twice daily to 6.25 mg p.o. twice daily due to significant bradycardia and pauses.   -Continue BiDil. -We will hold ACE inhibitor, ARB, Aldactone for now, due to patient's impaired renal function. -Cardiology input is appreciated.-Continue IV Lasix to 60 mg twice daily. -Monitor renal function and electrolytes closely.  Atrial fibrillation with rapid ventricular response: -Cardiology input is appreciated. -For Coumadin.  -Patient's beta-blocker decreased from 12.5 mg p.o. 12 admitted 6.25 mg p.o. twice of Coreg . Diabetes mellitus: -Continue to optimize.  Hypertension, uncontrolled: -As documented above, will change metoprolol to Coreg, and will start at 6.25 mg p.o. twice daily. -We will add BiDil. -IV Lasix 60 in the twice daily. -Continue to  monitor blood pressure, renal function and adjust accordingly. -11/23/2017: The patient's blood pressure is better  controlled.  Chronic kidney disease stage III versus acute kidney injury versus acute kidney injury on chronic kidney disease: -I have not really visualized patient's prior renal function documentation. -With history of diabetes mellitus and hypertension, I suspect that this could be chronic kidney disease stage III.  However, the patient is also at risk for cardio renal syndrome.  EF is only 20-25%. -Introduce ACE inhibitor or ARB when possible. -11/23/2017: Despite significant diuresis, the patient's serum creatinine has remained fairly stable.  We will continue current dose of diuretics.  We will continue to monitor renal function and electrolytes.  Morbid obesity: -Diet and exercise. -Encouraged patient to lose weight.  Possibly undiagnosed OSA and obesity hypoventilation syndrome: -Pursue outpatient sleep study.  Further management will depend on hospital course.  Will will get cardiology team to see the patient in the morning.  Patient's EF will need to be monitored.    DVT prophylaxis: Subcutaneous heparin Code Status: Full Family Communication: None Disposition Plan: Will depend on hospital course   Consultants:   Cardiology   Procedures:   Echocardiogram (kindly see above)  Antimicrobials:   None   Subjective: No new complaints.  Patient has not back to baseline.   Objective: Vitals:   11/22/17 0529 11/22/17 1536 11/22/17 2137 11/23/17 0506  BP: (!) 144/98 (!) 126/93 119/79 118/63  Pulse: 83 86 90 80  Resp: 18 19 18 20   Temp: 97.6 F (36.4 C) 98.5 F (36.9 C) 98.4 F (36.9 C) 97.7 F (36.5 C)  TempSrc: Oral Oral Oral Oral  SpO2: 96% 99% 99% 94%  Weight: (!) 170.3 kg (375 lb 7.1 oz)   (!) 167.6 kg (369 lb 8 oz)  Height:        Intake/Output Summary (Last 24 hours) at 11/23/2017 0818 Last data filed at 11/23/2017 0600 Gross per 24 hour  Intake 720 ml  Output 4051 ml  Net -3331 ml   Filed Weights   11/21/17 0608 11/22/17 0529 11/23/17 0506    Weight: (!) 173.5 kg (382 lb 9.6 oz) (!) 170.3 kg (375 lb 7.1 oz) (!) 167.6 kg (369 lb 8 oz)    Examination:  General exam: Appears calm and comfortable.  Morbidly obese Respiratory system: Decreased air entry globally.  C Cardiovascular system: S1 & S2  Gastrointestinal system: Abdomen is morbidly obese, soft and nontender.  Organs are difficult to assess.  Central nervous system: Alert and oriented. No focal neurological deficits. Extremities: Chronically looking bilateral lower extremity edema, but significantly improved. Skin: Bilateral, chronic, lower leg rash, worse on the right side there is also a superficial cut on patient's right lower leg.   Data Reviewed: I have personally reviewed following labs and imaging studies  CBC: Recent Labs  Lab 11/19/17 2010 11/20/17 0806 11/21/17 0544 11/22/17 0549  WBC 8.5 6.9 6.5 7.3  NEUTROABS 6.0  --   --   --   HGB 13.5 12.5* 12.4* 13.5  HCT 41.2 41.0 40.9 43.0  MCV 93.6 95.6 96.2 94.1  PLT 249 260 251 761   Basic Metabolic Panel: Recent Labs  Lab 11/19/17 2010 11/20/17 0153 11/21/17 0544 11/22/17 0549 11/23/17 0501  NA 145 143 141  142 139 140  K 4.2 3.6 3.6  3.6 4.3 3.7  CL 110 106 103  104 100* 99*  CO2 26 27 28  28 28  32  GLUCOSE 110* 128* 115*  115* 96 109*  BUN 20 19 20  20  21* 24*  CREATININE 1.55* 1.53* 1.34*  1.33* 1.38* 1.39*  CALCIUM 8.9 8.8* 8.3*  8.5* 8.6* 8.8*  MG  --   --  1.9  --   --   PHOS  --   --  4.2 4.0 3.9   GFR: Estimated Creatinine Clearance: 103.4 mL/min (A) (by C-G formula based on SCr of 1.39 mg/dL (H)). Liver Function Tests: Recent Labs  Lab 11/19/17 2010 11/21/17 0544 11/22/17 0549 11/23/17 0501  AST 37  --   --   --   ALT <5*  --   --   --   ALKPHOS 84  --   --   --   BILITOT 1.0  --   --   --   PROT 6.9  --   --   --   ALBUMIN 3.7 3.3* 3.5 3.2*   No results for input(s): LIPASE, AMYLASE in the last 168 hours. No results for input(s): AMMONIA in the last 168  hours. Coagulation Profile: Recent Labs  Lab 11/20/17 0153 11/23/17 0501  INR 1.56 1.30   Cardiac Enzymes: No results for input(s): CKTOTAL, CKMB, CKMBINDEX, TROPONINI in the last 168 hours. BNP (last 3 results) No results for input(s): PROBNP in the last 8760 hours. HbA1C: No results for input(s): HGBA1C in the last 72 hours. CBG: Recent Labs  Lab 11/21/17 1137 11/21/17 1610 11/21/17 2102 11/22/17 0742 11/22/17 1100  GLUCAP 147* 90 91 97 174*   Lipid Profile: No results for input(s): CHOL, HDL, LDLCALC, TRIG, CHOLHDL, LDLDIRECT in the last 72 hours. Thyroid Function Tests: Recent Labs    11/22/17 0606  TSH 4.111   Anemia Panel: No results for input(s): VITAMINB12, FOLATE, FERRITIN, TIBC, IRON, RETICCTPCT in the last 72 hours. Urine analysis: No results found for: COLORURINE, APPEARANCEUR, LABSPEC, PHURINE, GLUCOSEU, HGBUR, BILIRUBINUR, KETONESUR, PROTEINUR, UROBILINOGEN, NITRITE, LEUKOCYTESUR Sepsis Labs: @LABRCNTIP (procalcitonin:4,lacticidven:4)  )No results found for this or any previous visit (from the past 240 hour(s)).       Radiology Studies: No results found.      Scheduled Meds: . carvedilol  12.5 mg Oral BID WC  . furosemide  60 mg Intravenous BID  . isosorbide-hydrALAZINE  1 tablet Oral TID  . sodium chloride flush  3 mL Intravenous Q12H  . warfarin   Does not apply Once  . Warfarin - Pharmacist Dosing Inpatient   Does not apply q1800   Continuous Infusions: . sodium chloride       LOS: 4 days    Time spent: 35 minutes    Dana Allan, MD  [facility] Pager #: [phone] 7PM-7AM contact night coverage as above

## 2017-11-23 NOTE — Progress Notes (Signed)
[place] for warfarin Indication: atrial fibrillation  Allergies  Allergen Reactions  . Cucumber Extract     Patient Measurements: Height: 6\' 1"  (185.4 cm) Weight: (!) 369 lb 8 oz (167.6 kg) IBW/kg (Calculated) : 79.9 Heparin Dosing Weight:  Vital Signs: Temp: 97.7 F (36.5 C) (03/22 0506) Temp Source: Oral (03/22 0506) BP: 118/63 (03/22 0506) Pulse Rate: 80 (03/22 0506)  Labs: Recent Labs    11/20/17 1621 11/21/17 0015 11/21/17 0544 11/21/17 0920 11/22/17 0549 11/23/17 0501  HGB  --   --  12.4*  --  13.5  --   HCT  --   --  40.9  --  43.0  --   PLT  --   --  251  --  257  --   LABPROT  --   --   --   --   --  16.1*  INR  --   --   --   --   --  1.30  HEPARINUNFRC 0.22* 0.56  --  0.55  --   --   CREATININE  --   --  1.34*  1.33*  --  1.38* 1.39*    Estimated Creatinine Clearance: 103.4 mL/min (A) (by C-G formula based on SCr of 1.39 mg/dL (H)).   Assessment: Patient is 52 y.o M presented to the ED on 3/18 with c/o SOB and was found to be in afib. Chest CTA neg for PE.  Heparin drip started on admission for afib. Cardiology team indicates plan is to anticoagulate patient for 3 weeks prior to DCCV.  Patient was transitioned to Eliquis in AM of 3/21 with dose given at 0500.  However, due to high BMI and to ensure med compliance prior to cardioversion, will transition to warfarin as this can be monitored using INR. Per Dr. Percival Spanish, no bridging is needed.  Today, 11/23/2017: - CBC relatively stable - no bleeding documented - no significant drug-drug intxns   Goal of Therapy:  INR 2-3 Monitor platelets by anticoagulation protocol: Yes   Plan:  - repeat  warfarin 10 mg PO x1 today - daily INR  - monitor for s/s bleeding  Khrystyna Schwalm P 11/23/2017,8:27 AM

## 2017-11-23 NOTE — Progress Notes (Addendum)
Progress Note  Patient Name: Joe Blake Date of Encounter: 11/23/2017  Primary Cardiologist: Minus Breeding, MD   Subjective   Pt reports breathing better and edema has improved. He did have significant dizziness this morning, "saw stars", almost gone now.   Inpatient Medications    Scheduled Meds: . carvedilol  12.5 mg Oral BID WC  . furosemide  60 mg Intravenous BID  . isosorbide-hydrALAZINE  1 tablet Oral TID  . sodium chloride flush  3 mL Intravenous Q12H  . warfarin  10 mg Oral ONCE-1800  . warfarin   Does not apply Once  . Warfarin - Pharmacist Dosing Inpatient   Does not apply q1800   Continuous Infusions: . sodium chloride     PRN Meds: sodium chloride, acetaminophen, ondansetron (ZOFRAN) IV, sodium chloride flush   Vital Signs    Vitals:   11/23/17 0506 11/23/17 0853 11/23/17 1057 11/23/17 1120  BP: 118/63 131/74 120/68 (!) 95/50  Pulse: 80 93    Resp: 20     Temp: 97.7 F (36.5 C)     TempSrc: Oral     SpO2: 94%     Weight: (!) 369 lb 8 oz (167.6 kg)     Height:        Intake/Output Summary (Last 24 hours) at 11/23/2017 1123 Last data filed at 11/23/2017 0600 Gross per 24 hour  Intake 480 ml  Output 3351 ml  Net -2871 ml   Filed Weights   11/21/17 0608 11/22/17 0529 11/23/17 0506  Weight: (!) 382 lb 9.6 oz (173.5 kg) (!) 375 lb 7.1 oz (170.3 kg) (!) 369 lb 8 oz (167.6 kg)    Telemetry    Atrial fibrillation in the 70's-80s - Personally Reviewed  ECG    No new tracings - Personally Reviewed  Physical Exam   GEN: Obese male, No acute distress.   Neck: No JVD Cardiac: Irregularly irregular rhythm, no murmurs, rubs, or gallops.  Respiratory: Clear to auscultation bilaterally. GI: Soft, nontender, non-distended  MS: 2+ lower extremity edema; redness of bilat lower legs, scab on right lower leg Neuro:  Nonfocal  Psych: Normal affect   Labs    Chemistry Recent Labs  Lab 11/19/17 2010  11/21/17 0544 11/22/17 0549 11/23/17 0501  NA  145   < > 141  142 139 140  K 4.2   < > 3.6  3.6 4.3 3.7  CL 110   < > 103  104 100* 99*  CO2 26   < > 28  28 28  32  GLUCOSE 110*   < > 115*  115* 96 109*  BUN 20   < > 20  20 21* 24*  CREATININE 1.55*   < > 1.34*  1.33* 1.38* 1.39*  CALCIUM 8.9   < > 8.3*  8.5* 8.6* 8.8*  PROT 6.9  --   --   --   --   ALBUMIN 3.7  --  3.3* 3.5 3.2*  AST 37  --   --   --   --   ALT <5*  --   --   --   --   ALKPHOS 84  --   --   --   --   BILITOT 1.0  --   --   --   --   GFRNONAA 51*   < > >60  >60 58* 58*  GFRAA 59*   < > >60  >60 >60 >60  ANIONGAP 9   < > 10  10 11 9    < > = values in this interval not displayed.     Hematology Recent Labs  Lab 11/20/17 0806 11/21/17 0544 11/22/17 0549  WBC 6.9 6.5 7.3  RBC 4.29 4.25 4.57  HGB 12.5* 12.4* 13.5  HCT 41.0 40.9 43.0  MCV 95.6 96.2 94.1  MCH 29.1 29.2 29.5  MCHC 30.5 30.3 31.4  RDW 15.5 15.3 15.3  PLT 260 251 257    Cardiac EnzymesNo results for input(s): TROPONINI in the last 168 hours.  Recent Labs  Lab 11/19/17 2017  TROPIPOC 0.08     BNP Recent Labs  Lab 11/19/17 2010  BNP 248.5*     DDimer  Recent Labs  Lab 11/19/17 2010  DDIMER 1.21*     Radiology    No results found.  Cardiac Studies   Echocardiogram 11/20/17 Study Conclusions  - Left ventricle: The cavity size was moderately dilated. Wall   thickness was increased in a pattern of mild LVH. Left   ventricular geometry showed evidence of eccentric hypertrophy.   Systolic function was severely reduced. The estimated ejection   fraction was in the range of 20% to 25%. Severe diffuse   hypokinesis with no identifiable regional variations. Acoustic   contrast opacification revealed no evidence ofthrombus. - Aortic valve: There was mild regurgitation. - Mitral valve: There was mild regurgitation directed centrally. - Left atrium: The atrium was severely dilated. - Right ventricle: The cavity size was moderately dilated. Systolic   function was  mildly reduced. - Right atrium: The atrium was severely dilated. - Pericardium, extracardiac: A trivial pericardial effusion was   identified.   Patient Profile     52 y.o. male , morbidly obese with BMI 50, past medical history of  HTN and DM admitted 11/19/17 with DOE and was found to have AF with RVR and CHF.   Assessment & Plan    Acute systolic heart failure: EF 20-25%. Etiology as yet undetermined, most likely NICM. Previous admission to [facility] in [place]. [place] in 2013 and was told his heart was pumping at 33%. He was placed on meds but he could not afford the meds and follow up. No hx of MI or cath. Poor candidate for Myoview due to size. Diuresing with lasix 60 mg IV BID with 4L UOP in last 24h and net negative 11L fluid balance since admission. Wt down from 390 to 369. Breathing and edema have improved. Still has lower leg edema.  Carvedilol started, 12.5 mg bid, and bidil. Pt with dizziness this am and drop in BP will change meds as below.  Dietitian is present for dietary counseling, low sodium diet and fluid restriction.   Newly diagnosed atrial fibrillation with RVR: No known previous diagnosis per pt. Started on coreg with rates in the 80's-90's, however, has dizziness this am with drop in BP and had a 3.1 second pause. His CHA2DS-Vasc score is 3 (HTN, DM II, CHF). Would like to use DOAC, but warfarin therapy recommended due to BMI of 50. Dosing per pharmacy.  Hypertension: No previously medicated. Coreg and bidil initiated. BP has steadily come down. This am pt noted dizziness after taking AM meds. BP down from 131/74 to 95/50. Will decrease carvedilol. Will consider changing Bidil to single ACE-I.   Diabetes type 2: management per primary team. A1c 5.9.  Renal insufficiency: SCr 1.55 on admission. Improved to 1.39, stable for last 4 days.   High risk for sleep apnea: Pt states that he had trial  of CPAP in hospital in [place] but could not tolerate the mask. Sleep  apnea could be playing into his atrial fib and may make it hard to control. Ideally would recommend sleep study, but pt likely cannot afford it.    For questions or updates, please contact [place] Please consult [email or web address] for contact info under Cardiology/STEMI.      Signed, Daune Perch, NP  11/23/2017, 11:23 AM    History and all data above reviewed.  Patient examined.  I agree with the findings as above.   Hypotension and dizziness noted. The patient exam reveals FSF:[id number]  ,  Lungs: Clear  ,  Abd: Positive bowel sounds, no rebound no guarding, Ext Moderate edema  .  All available labs, radiology testing, previous records reviewed. Agree with documented assessment and plan. Cardiomyopathy:  Agree with decreased Coreg.  I am going to stop the BiDil and try ACE inhibitor. He will need to have his creat followed closely.  Key to his success with be adherence with office visit follow up.  I will consider appropriate ischemia work up in the future pending his EF after he has had med titration and management of his atrial fib.   Jeneen Rinks Terryn Redner  1:36 PM  11/23/2017

## 2017-11-24 LAB — BASIC METABOLIC PANEL
ANION GAP: 9 (ref 5–15)
BUN: 27 mg/dL — ABNORMAL HIGH (ref 6–20)
CALCIUM: 8.9 mg/dL (ref 8.9–10.3)
CO2: 30 mmol/L (ref 22–32)
CREATININE: 1.32 mg/dL — AB (ref 0.61–1.24)
Chloride: 99 mmol/L — ABNORMAL LOW (ref 101–111)
GLUCOSE: 118 mg/dL — AB (ref 65–99)
Potassium: 4 mmol/L (ref 3.5–5.1)
Sodium: 138 mmol/L (ref 135–145)

## 2017-11-24 LAB — PROTIME-INR
INR: 1.53
Prothrombin Time: 18.3 seconds — ABNORMAL HIGH (ref 11.4–15.2)

## 2017-11-24 MED ORDER — FUROSEMIDE 40 MG PO TABS
60.0000 mg | ORAL_TABLET | Freq: Two times a day (BID) | ORAL | Status: DC
  Administered 2017-11-24 (×2): 60 mg via ORAL
  Filled 2017-11-24 (×2): qty 1

## 2017-11-24 MED ORDER — LISINOPRIL 5 MG PO TABS
5.0000 mg | ORAL_TABLET | Freq: Every day | ORAL | Status: DC
  Administered 2017-11-24: 5 mg via ORAL

## 2017-11-24 MED ORDER — WARFARIN SODIUM 5 MG PO TABS
10.0000 mg | ORAL_TABLET | Freq: Once | ORAL | Status: AC
  Administered 2017-11-24: 10 mg via ORAL
  Filled 2017-11-24: qty 2

## 2017-11-24 NOTE — Progress Notes (Signed)
PROGRESS NOTE    Joe Blake  AST:[id number] DOB: 1966/03/13 DOA: 11/19/2017 PCP: Patient, No Pcp Per     Brief Narrative:  52 year old with past medical history relevant for likely nonischemic cardiomyopathy, type 2 diabetes, hypertension admitted with acute on chronic systolic congestive heart failure and mild AK I as well as new onset atrial fibrillation.   Assessment & Plan:   Principal Problem:   Acute on chronic systolic CHF (congestive heart failure) (HCC) Active Problems:   New onset a-fib (HCC)   DM2 (diabetes mellitus, type 2) (HCC)   HTN (hypertension)   Obesity, Class III, BMI 40-49.9 (morbid obesity) (HCC)   CKD (chronic kidney disease) stage 3, GFR 30-59 ml/min (HCC)   LBBB (left bundle branch block)   Morbid obesity ([place])   Acute systolic HF (heart failure) ([place])   Atrial fibrillation ([place])   #) Acute on chronic systolic heart failure: Patient apparently has been off medications for some years for unclear reasons.  Clinically he has been improving with IV diuresis with furosemide.  Echo shows EF of about 20-25%. -Continue carvedilol 6.25 mg twice daily -Continue lisinopril 5 mg daily -Transition to furosemide 60 mg twice daily -Telemetry - Sodium restricted diet, fluid restriction, strict ins and outs, weigh daily  #) Acute kidney injury: Resolved with diuresis  #) New onset atrial fibrillation: Patient is not a candidate for novel oral anticoagulants because of his class III obesity. -Continue warfarin, current INR is 1.54  #) Hypertension: -Continue ACE inhibitor and beta-blocker per above  #) Type 2 diabetes: A1c is 5.9  Fluids: Restricted Electrolytes: Monitor and supplement Nutrition: Sodium restricted, fluid restricted diet  Prophylaxis: On warfarin  Disposition: Pending therapeutic INR and increase diuresis  Full code   Consultants:   Cardiology  Procedures: (Don't include imaging studies which can be auto populated. Include things that  cannot be auto populated i.e. Echo, Carotid and venous dopplers, Foley, Bipap, HD, tubes/drains, wound vac, central lines etc) Echo 11/20/2017: - Left ventricle: The cavity size was moderately dilated. Wall   thickness was increased in a pattern of mild LVH. Left   ventricular geometry showed evidence of eccentric hypertrophy.   Systolic function was severely reduced. The estimated ejection   fraction was in the range of 20% to 25%. Severe diffuse   hypokinesis with no identifiable regional variations. Acoustic   contrast opacification revealed no evidence ofthrombus. - Aortic valve: There was mild regurgitation. - Mitral valve: There was mild regurgitation directed centrally. - Left atrium: The atrium was severely dilated. - Right ventricle: The cavity size was moderately dilated. Systolic   function was mildly reduced. - Right atrium: The atrium was severely dilated. - Pericardium, extracardiac: A trivial pericardial effusion was   identified.    Antimicrobials: (specify start and planned stop date. Auto populated tables are space occupying and do not give end dates)  None   Subjective: Patient reports that his dyspnea on exertion is improved dramatically.  He also reports lower extremity edema has drastically improved.  He denies any chest pain, nausea, vomiting, diarrhea.  He has chronic orthopnea but no paroxysmal nocturnal dyspnea.  Objective: Vitals:   11/23/17 1120 11/23/17 1312 11/23/17 1656 11/24/17 0606  BP: (!) 95/50 (!) 107/52 127/85 132/72  Pulse:  100 82 76  Resp:  20  20  Temp:  98.4 F (36.9 C)  97.6 F (36.4 C)  TempSrc:  Oral  Oral  SpO2:  97%  97%  Weight:    (!) 164.7 kg (  363 lb 3.2 oz)  Height:        Intake/Output Summary (Last 24 hours) at 11/24/2017 1331 Last data filed at 11/24/2017 1300 Gross per 24 hour  Intake 240 ml  Output 4600 ml  Net -4360 ml   Filed Weights   11/22/17 0529 11/23/17 0506 11/24/17 0606  Weight: (!) 170.3 kg (375 lb 7.1  oz) (!) 167.6 kg (369 lb 8 oz) (!) 164.7 kg (363 lb 3.2 oz)    Examination:  General exam: Appears calm and comfortable  Respiratory system: Clear to auscultation. Respiratory effort normal.  Faint crackles in bases, no wheezes or rhonchi Cardiovascular system: Irregularly irregular, no murmurs rubs or gallops. Gastrointestinal system: Obese, soft, nontender, nondistended, plus bowel sounds Central nervous system: Alert and oriented.  Grossly intact Extremities: 2+ lower extremity edema patient Skin: Chronic venous stasis skin changes in lower extremities, brawny induration of skin Psychiatry: Judgement and insight appear normal. Mood & affect appropriate.     Data Reviewed: I have personally reviewed following labs and imaging studies  CBC: Recent Labs  Lab 11/19/17 2010 11/20/17 0806 11/21/17 0544 11/22/17 0549  WBC 8.5 6.9 6.5 7.3  NEUTROABS 6.0  --   --   --   HGB 13.5 12.5* 12.4* 13.5  HCT 41.2 41.0 40.9 43.0  MCV 93.6 95.6 96.2 94.1  PLT 249 260 251 485   Basic Metabolic Panel: Recent Labs  Lab 11/20/17 0153 11/21/17 0544 11/22/17 0549 11/23/17 0501 11/24/17 0510  NA 143 141  142 139 140 138  K 3.6 3.6  3.6 4.3 3.7 4.0  CL 106 103  104 100* 99* 99*  CO2 27 28  28 28  32 30  GLUCOSE 128* 115*  115* 96 109* 118*  BUN 19 20  20  21* 24* 27*  CREATININE 1.53* 1.34*  1.33* 1.38* 1.39* 1.32*  CALCIUM 8.8* 8.3*  8.5* 8.6* 8.8* 8.9  MG  --  1.9  --   --   --   PHOS  --  4.2 4.0 3.9  --    GFR: Estimated Creatinine Clearance: 107.8 mL/min (A) (by C-G formula based on SCr of 1.32 mg/dL (H)). Liver Function Tests: Recent Labs  Lab 11/19/17 2010 11/21/17 0544 11/22/17 0549 11/23/17 0501  AST 37  --   --   --   ALT <5*  --   --   --   ALKPHOS 84  --   --   --   BILITOT 1.0  --   --   --   PROT 6.9  --   --   --   ALBUMIN 3.7 3.3* 3.5 3.2*   No results for input(s): LIPASE, AMYLASE in the last 168 hours. No results for input(s): AMMONIA in the last  168 hours. Coagulation Profile: Recent Labs  Lab 11/20/17 0153 11/23/17 0501 11/24/17 0510  INR 1.56 1.30 1.53   Cardiac Enzymes: No results for input(s): CKTOTAL, CKMB, CKMBINDEX, TROPONINI in the last 168 hours. BNP (last 3 results) No results for input(s): PROBNP in the last 8760 hours. HbA1C: No results for input(s): HGBA1C in the last 72 hours. CBG: Recent Labs  Lab 11/21/17 1137 11/21/17 1610 11/21/17 2102 11/22/17 0742 11/22/17 1100  GLUCAP 147* 90 91 97 174*   Lipid Profile: No results for input(s): CHOL, HDL, LDLCALC, TRIG, CHOLHDL, LDLDIRECT in the last 72 hours. Thyroid Function Tests: Recent Labs    11/22/17 0606  TSH 4.111   Anemia Panel: No results for input(s): VITAMINB12, FOLATE, FERRITIN,  TIBC, IRON, RETICCTPCT in the last 72 hours. Sepsis Labs: Recent Labs  Lab 11/19/17 2019  LATICACIDVEN 1.25    No results found for this or any previous visit (from the past 240 hour(s)).       Radiology Studies: No results found.      Scheduled Meds: . carvedilol  6.25 mg Oral BID WC  . furosemide  60 mg Oral BID  . lisinopril  5 mg Oral Daily  . sodium chloride flush  3 mL Intravenous Q12H  . warfarin  10 mg Oral ONCE-1800  . warfarin   Does not apply Once  . Warfarin - Pharmacist Dosing Inpatient   Does not apply q1800   Continuous Infusions: . sodium chloride       LOS: 5 days    Time spent: [place], MD [facility] Pager [phone]  If 7PM-7AM, please contact night-coverage [email or web address] Password [email or web address] 11/24/2017, 1:31 PM

## 2017-11-24 NOTE — Progress Notes (Signed)
Joe Blake for warfarin Indication: atrial fibrillation  Allergies  Allergen Reactions  . Cucumber Extract     Patient Measurements: Height: 6\' 1"  (185.4 cm) Weight: (!) 363 lb 3.2 oz (164.7 kg) IBW/kg (Calculated) : 79.9 Heparin Dosing Weight:  Vital Signs: Temp: 97.6 F (36.4 C) (03/23 0606) Temp Source: Oral (03/23 0606) BP: 132/72 (03/23 0606) Pulse Rate: 76 (03/23 0606)  Labs: Recent Labs    11/22/17 0549 11/23/17 0501 11/24/17 0510  HGB 13.5  --   --   HCT 43.0  --   --   PLT 257  --   --   LABPROT  --  16.1* 18.3*  INR  --  1.30 1.53  CREATININE 1.38* 1.39* 1.32*    Estimated Creatinine Clearance: 107.8 mL/min (A) (by C-G formula based on SCr of 1.32 mg/dL (H)).   Assessment: Patient is 52 y.o M presented to the ED on 3/18 with c/o SOB and was found to be in afib. Chest CTA neg for PE.  Heparin drip started on admission for afib. Cardiology team indicates plan is to anticoagulate patient for 3 weeks prior to DCCV.  Patient was transitioned to Eliquis in AM of 3/21 with dose given at 0500.  However, due to high BMI and to ensure med compliance prior to cardioversion, will transition to warfarin as this can be monitored using INR. Per Dr. Percival Spanish, no bridging is needed.  Today, 11/24/2017: - CBC relatively stable, last drawn 3/21 - no bleeding documented - no significant drug-drug intxns   Goal of Therapy:  INR 2-3 Monitor platelets by anticoagulation protocol: Yes   Plan:  - repeat  warfarin 10 mg PO x1 today - daily INR  - monitor for s/s bleeding  Royetta Asal, PharmD, BCPS Pager [phone] 11/24/2017 10:24 AM

## 2017-11-24 NOTE — Progress Notes (Signed)
Progress Note  Patient Name: Joe Blake Date of Encounter: 11/24/2017  Primary Cardiologist: Minus Breeding, MD   Subjective   No dyspnea Gives thumbs up feels better. Was wondering how long tetanus shot lasts   Inpatient Medications    Scheduled Meds: . carvedilol  6.25 mg Oral BID WC  . furosemide  60 mg Intravenous BID  . lisinopril  2.5 mg Oral Daily  . sodium chloride flush  3 mL Intravenous Q12H  . warfarin   Does not apply Once  . Warfarin - Pharmacist Dosing Inpatient   Does not apply q1800   Continuous Infusions: . sodium chloride     PRN Meds: sodium chloride, acetaminophen, ondansetron (ZOFRAN) IV, sodium chloride flush   Vital Signs    Vitals:   11/23/17 1120 11/23/17 1312 11/23/17 1656 11/24/17 0606  BP: (!) 95/50 (!) 107/52 127/85 132/72  Pulse:  100 82 76  Resp:  20  20  Temp:  98.4 F (36.9 C)  97.6 F (36.4 C)  TempSrc:  Oral  Oral  SpO2:  97%  97%  Weight:    (!) 363 lb 3.2 oz (164.7 kg)  Height:        Intake/Output Summary (Last 24 hours) at 11/24/2017 0829 Last data filed at 11/24/2017 0601 Gross per 24 hour  Intake 0 ml  Output 5200 ml  Net -5200 ml   Filed Weights   11/22/17 0529 11/23/17 0506 11/24/17 0606  Weight: (!) 375 lb 7.1 oz (170.3 kg) (!) 369 lb 8 oz (167.6 kg) (!) 363 lb 3.2 oz (164.7 kg)    Telemetry    Atrial fibrillation in the 80s - Personally Reviewed 11/24/2017   ECG    Afib nonspecific ST chagnes   Physical Exam   Affect appropriate Morbidly obese white male  HEENT: normal Neck supple with no adenopathy JVP normal no bruits no thyromegaly Lungs clear with no wheezing and good diaphragmatic motion Heart:  S1/S2 no murmur, no rub, gallop or click PMI normal Abdomen: benighn, BS positve, no tenderness, no AAA no bruit.  No HSM or HJR Distal pulses intact with no bruits Plus 2 LE  Edema with chronic erythema  Neuro non-focal Skin warm and dry No muscular weakness   Labs    Chemistry Recent Labs    Lab 11/19/17 2010  11/21/17 0544 11/22/17 0549 11/23/17 0501 11/24/17 0510  NA 145   < > 141  142 139 140 138  K 4.2   < > 3.6  3.6 4.3 3.7 4.0  CL 110   < > 103  104 100* 99* 99*  CO2 26   < > 28  28 28  32 30  GLUCOSE 110*   < > 115*  115* 96 109* 118*  BUN 20   < > 20  20 21* 24* 27*  CREATININE 1.55*   < > 1.34*  1.33* 1.38* 1.39* 1.32*  CALCIUM 8.9   < > 8.3*  8.5* 8.6* 8.8* 8.9  PROT 6.9  --   --   --   --   --   ALBUMIN 3.7  --  3.3* 3.5 3.2*  --   AST 37  --   --   --   --   --   ALT <5*  --   --   --   --   --   ALKPHOS 84  --   --   --   --   --   BILITOT 1.0  --   --   --   --   --  GFRNONAA 51*   < > >60  >60 58* 58* >60  GFRAA 59*   < > >60  >60 >60 >60 >60  ANIONGAP 9   < > 10  10 11 9 9    < > = values in this interval not displayed.     Hematology Recent Labs  Lab 11/20/17 0806 11/21/17 0544 11/22/17 0549  WBC 6.9 6.5 7.3  RBC 4.29 4.25 4.57  HGB 12.5* 12.4* 13.5  HCT 41.0 40.9 43.0  MCV 95.6 96.2 94.1  MCH 29.1 29.2 29.5  MCHC 30.5 30.3 31.4  RDW 15.5 15.3 15.3  PLT 260 251 257    Cardiac EnzymesNo results for input(s): TROPONINI in the last 168 hours.  Recent Labs  Lab 11/19/17 2017  TROPIPOC 0.08     BNP Recent Labs  Lab 11/19/17 2010  BNP 248.5*     DDimer  Recent Labs  Lab 11/19/17 2010  DDIMER 1.21*     Radiology    No results found.  Cardiac Studies   Echocardiogram 11/20/17 Study Conclusions  - Left ventricle: The cavity size was moderately dilated. Wall   thickness was increased in a pattern of mild LVH. Left   ventricular geometry showed evidence of eccentric hypertrophy.   Systolic function was severely reduced. The estimated ejection   fraction was in the range of 20% to 25%. Severe diffuse   hypokinesis with no identifiable regional variations. Acoustic   contrast opacification revealed no evidence ofthrombus. - Aortic valve: There was mild regurgitation. - Mitral valve: There was mild  regurgitation directed centrally. - Left atrium: The atrium was severely dilated. - Right ventricle: The cavity size was moderately dilated. Systolic   function was mildly reduced. - Right atrium: The atrium was severely dilated. - Pericardium, extracardiac: A trivial pericardial effusion was   identified.   Patient Profile     52 y.o. male , morbidly obese with BMI 50, past medical history of  HTN and DM admitted 11/19/17 with DOE and was found to have AF with RVR and CHF.   Assessment & Plan    Acute systolic heart failure: EF 20-25%. Likely non ischemic increase lisinopril to 5 mg since Cr stable change to PO lasix Aim for d/c on Monday with outpatient f/u with Dr Warren Lacy  Newly diagnosed atrial fibrillation with RVR: No known previous diagnosis per pt. Started on coreg with rates in the 80's-90's, pharmacy recommended coumadin due to high weight/body fat. INR 1.53 hopefully Rx by Monday   Hypertension: improved with ACE   Diabetes type 2: management per primary team. A1c 5.9.  Renal insufficiency: Cr stable 1.38 tolerating ACE   High risk for sleep apnea: Pt states that he had trial of CPAP in hospital in [place] but could not tolerate the mask. Sleep apnea could be playing into his atrial fib and may make it hard to control. Ideally would recommend sleep study, but pt likely cannot afford it.    Jenkins Rouge

## 2017-11-24 NOTE — Progress Notes (Signed)
Tele called and reported pt had 2.4 sec pause. Pt awake and speaking to me. Reports no symptoms. MD updated. Will cont to monitor

## 2017-11-25 LAB — BASIC METABOLIC PANEL WITH GFR
Calcium: 8.9 mg/dL (ref 8.9–10.3)
Creatinine, Ser: 1.62 mg/dL — ABNORMAL HIGH (ref 0.61–1.24)
GFR calc Af Amer: 56 mL/min — ABNORMAL LOW (ref >60)
GFR calc non Af Amer: 48 mL/min — ABNORMAL LOW (ref >60)
Sodium: 139 mmol/L (ref 135–145)

## 2017-11-25 LAB — BASIC METABOLIC PANEL
Anion gap: 10 (ref 5–15)
BUN: 31 mg/dL — ABNORMAL HIGH (ref 6–20)
CO2: 30 mmol/L (ref 22–32)
Chloride: 99 mmol/L — ABNORMAL LOW (ref 101–111)
Glucose, Bld: 114 mg/dL — ABNORMAL HIGH (ref 65–99)
Potassium: 5 mmol/L (ref 3.5–5.1)

## 2017-11-25 LAB — MAGNESIUM: Magnesium: 2.3 mg/dL (ref 1.7–2.4)

## 2017-11-25 LAB — PROTIME-INR
INR: 1.45
Prothrombin Time: 17.5 seconds — ABNORMAL HIGH (ref 11.4–15.2)

## 2017-11-25 MED ORDER — COLCHICINE 0.6 MG PO TABS
1.2000 mg | ORAL_TABLET | Freq: Once | ORAL | Status: AC
  Administered 2017-11-25: 1.2 mg via ORAL
  Filled 2017-11-25: qty 2

## 2017-11-25 MED ORDER — FUROSEMIDE 40 MG PO TABS: 60.0000 mg | ORAL_TABLET | Freq: Every day | ORAL | Status: DC

## 2017-11-25 MED ORDER — WARFARIN SODIUM 5 MG PO TABS
12.5000 mg | ORAL_TABLET | Freq: Once | ORAL | Status: AC
  Administered 2017-11-25: 12.5 mg via ORAL
  Filled 2017-11-25: qty 1

## 2017-11-25 MED ORDER — LISINOPRIL 5 MG PO TABS: 5.0000 mg | ORAL_TABLET | Freq: Every day | ORAL | Status: DC

## 2017-11-25 MED ORDER — FUROSEMIDE 40 MG PO TABS
40.0000 mg | ORAL_TABLET | Freq: Every day | ORAL | Status: DC
  Administered 2017-11-26: 40 mg via ORAL
  Filled 2017-11-25: qty 1

## 2017-11-25 MED ORDER — COLCHICINE 0.6 MG PO TABS: 0.6000 mg | ORAL_TABLET | Freq: Once | ORAL | Status: DC | PRN

## 2017-11-25 NOTE — Progress Notes (Signed)
PROGRESS NOTE    Joe Blake  JSH:[id number] DOB: May 10, 1966 DOA: 11/19/2017 PCP: Patient, No Pcp Per     Brief Narrative:  52 year old with past medical history relevant for likely nonischemic cardiomyopathy, type 2 diabetes, hypertension admitted with acute on chronic systolic congestive heart failure and mild AK I as well as new onset atrial fibrillation.   Assessment & Plan:   Principal Problem:   Acute on chronic systolic CHF (congestive heart failure) (HCC) Active Problems:   New onset a-fib (HCC)   DM2 (diabetes mellitus, type 2) (HCC)   HTN (hypertension)   Obesity, Class III, BMI 40-49.9 (morbid obesity) (HCC)   CKD (chronic kidney disease) stage 3, GFR 30-59 ml/min (HCC)   LBBB (left bundle branch block)   Morbid obesity ([place])   Acute systolic HF (heart failure) ([place])   Atrial fibrillation ([place])   #) Acute on chronic systolic heart failure: Patient apparently has been off medications for some years for unclear reasons. Echo shows EF of about 20-25%. -Continue carvedilol 6.25 mg twice daily -Discontinue lisinopril 5 mg daily due to AK I -Addition to oral furosemide 40 mg daily tomorrow, holding diuretics today due to AK I -Telemetry - Sodium restricted diet, fluid restriction, strict ins and outs, weigh daily  #) Acute kidney injury: Has returned with significant amounts of diuresis. -Hold ACE inhibitor -Hold diuretics today, transition to oral diuretics tomorrow  #) New onset atrial fibrillation: Patient is not a candidate for novel oral anticoagulants because of his class III obesity. -Continue warfarin, patient can be discharged with subtherapeutic INR and will have INR check early next week  #) Hypertension: -Continue beta-blocker per above -Restart ACE inhibitor on discharge or as an outpatient  #) Type 2 diabetes: A1c is 5.9  Fluids: Restricted Electrolytes: Monitor and supplement Nutrition: Sodium restricted, fluid restricted diet  Prophylaxis: On  warfarin  Disposition: Stabilization of creatinine  Full code   Consultants:   Cardiology  Procedures: (Don't include imaging studies which can be auto populated. Include things that cannot be auto populated i.e. Echo, Carotid and venous dopplers, Foley, Bipap, HD, tubes/drains, wound vac, central lines etc) Echo 11/20/2017: - Left ventricle: The cavity size was moderately dilated. Wall   thickness was increased in a pattern of mild LVH. Left   ventricular geometry showed evidence of eccentric hypertrophy.   Systolic function was severely reduced. The estimated ejection   fraction was in the range of 20% to 25%. Severe diffuse   hypokinesis with no identifiable regional variations. Acoustic   contrast opacification revealed no evidence ofthrombus. - Aortic valve: There was mild regurgitation. - Mitral valve: There was mild regurgitation directed centrally. - Left atrium: The atrium was severely dilated. - Right ventricle: The cavity size was moderately dilated. Systolic   function was mildly reduced. - Right atrium: The atrium was severely dilated. - Pericardium, extracardiac: A trivial pericardial effusion was   identified.    Antimicrobials: (specify start and planned stop date. Auto populated tables are space occupying and do not give end dates)  None   Subjective: Patient is ambulate in the halls.  He is jovial talking about sleeping well.  He does not have any other symptoms.  Objective: Vitals:   11/24/17 1513 11/24/17 1744 11/24/17 2112 11/25/17 0535  BP: 121/62 135/71 110/67 136/78  Pulse: (!) 44 66 90 69  Resp: 18  17 17   Temp: 97.6 F (36.4 C)  98 F (36.7 C) 98 F (36.7 C)  TempSrc: Oral  Axillary Oral  SpO2: 98%  96% 98%  Weight:    (!) 165.1 kg (363 lb 14.4 oz)  Height:        Intake/Output Summary (Last 24 hours) at 11/25/2017 1112 Last data filed at 11/25/2017 0900 Gross per 24 hour  Intake 840 ml  Output 2625 ml  Net -1785 ml   Filed Weights     11/23/17 0506 11/24/17 0606 11/25/17 0535  Weight: (!) 167.6 kg (369 lb 8 oz) (!) 164.7 kg (363 lb 3.2 oz) (!) 165.1 kg (363 lb 14.4 oz)    Examination:  General exam: Appears calm and comfortable  Respiratory system: Clear to auscultation. Respiratory effort normal.  No crackles, wheezes, rhonchi Cardiovascular system: Irregularly irregular, no murmurs rubs or gallops. Gastrointestinal system: Obese, soft, nontender, nondistended, plus bowel sounds Central nervous system: Alert and oriented.  Grossly intact Extremities: 2+ lower extremity edema patient Skin: Chronic venous stasis skin changes in lower extremities, brawny induration of skin Psychiatry: Judgement and insight appear normal. Mood & affect appropriate.     Data Reviewed: I have personally reviewed following labs and imaging studies  CBC: Recent Labs  Lab 11/19/17 2010 11/20/17 0806 11/21/17 0544 11/22/17 0549  WBC 8.5 6.9 6.5 7.3  NEUTROABS 6.0  --   --   --   HGB 13.5 12.5* 12.4* 13.5  HCT 41.2 41.0 40.9 43.0  MCV 93.6 95.6 96.2 94.1  PLT 249 260 251 102   Basic Metabolic Panel: Recent Labs  Lab 11/21/17 0544 11/22/17 0549 11/23/17 0501 11/24/17 0510 11/25/17 0446  NA 141  142 139 140 138 139  K 3.6  3.6 4.3 3.7 4.0 5.0  CL 103  104 100* 99* 99* 99*  CO2 28  28 28  32 30 30  GLUCOSE 115*  115* 96 109* 118* 114*  BUN 20  20 21* 24* 27* 31*  CREATININE 1.34*  1.33* 1.38* 1.39* 1.32* 1.62*  CALCIUM 8.3*  8.5* 8.6* 8.8* 8.9 8.9  MG 1.9  --   --   --  2.3  PHOS 4.2 4.0 3.9  --   --    GFR: Estimated Creatinine Clearance: 88 mL/min (A) (by C-G formula based on SCr of 1.62 mg/dL (H)). Liver Function Tests: Recent Labs  Lab 11/19/17 2010 11/21/17 0544 11/22/17 0549 11/23/17 0501  AST 37  --   --   --   ALT <5*  --   --   --   ALKPHOS 84  --   --   --   BILITOT 1.0  --   --   --   PROT 6.9  --   --   --   ALBUMIN 3.7 3.3* 3.5 3.2*   No results for input(s): LIPASE, AMYLASE in the last  168 hours. No results for input(s): AMMONIA in the last 168 hours. Coagulation Profile: Recent Labs  Lab 11/20/17 0153 11/23/17 0501 11/24/17 0510 11/25/17 0446  INR 1.56 1.30 1.53 1.45   Cardiac Enzymes: No results for input(s): CKTOTAL, CKMB, CKMBINDEX, TROPONINI in the last 168 hours. BNP (last 3 results) No results for input(s): PROBNP in the last 8760 hours. HbA1C: No results for input(s): HGBA1C in the last 72 hours. CBG: Recent Labs  Lab 11/21/17 1137 11/21/17 1610 11/21/17 2102 11/22/17 0742 11/22/17 1100  GLUCAP 147* 90 91 97 174*   Lipid Profile: No results for input(s): CHOL, HDL, LDLCALC, TRIG, CHOLHDL, LDLDIRECT in the last 72 hours. Thyroid Function Tests: No results for input(s): TSH, T4TOTAL, FREET4, T3FREE, THYROIDAB in  the last 72 hours. Anemia Panel: No results for input(s): VITAMINB12, FOLATE, FERRITIN, TIBC, IRON, RETICCTPCT in the last 72 hours. Sepsis Labs: Recent Labs  Lab 11/19/17 2019  LATICACIDVEN 1.25    No results found for this or any previous visit (from the past 240 hour(s)).       Radiology Studies: No results found.      Scheduled Meds: . carvedilol  6.25 mg Oral BID WC  . [START ON 11/26/2017] furosemide  40 mg Oral Daily  . sodium chloride flush  3 mL Intravenous Q12H  . warfarin  12.5 mg Oral ONCE-1800  . warfarin   Does not apply Once  . Warfarin - Pharmacist Dosing Inpatient   Does not apply q1800   Continuous Infusions: . sodium chloride       LOS: 6 days    Time spent: [place], MD [facility] Pager [phone]  If 7PM-7AM, please contact night-coverage [email or web address] Password [email or web address] 11/25/2017, 11:12 AM

## 2017-11-25 NOTE — Plan of Care (Signed)
  Problem: Health Behavior/Discharge Planning: Goal: Ability to manage health-related needs will improve Outcome: Progressing   Problem: Clinical Measurements: Goal: Ability to maintain clinical measurements within normal limits will improve Outcome: Progressing Goal: Will remain free from infection Outcome: Progressing Goal: Diagnostic test results will improve Outcome: Progressing Goal: Respiratory complications will improve Outcome: Progressing Goal: Cardiovascular complication will be avoided Outcome: Progressing   Problem: Elimination: Goal: Will not experience complications related to bowel motility Outcome: Progressing Goal: Will not experience complications related to urinary retention Outcome: Progressing   Problem: Skin Integrity: Goal: Risk for impaired skin integrity will decrease Outcome: Progressing   Problem: Cardiac: Goal: Ability to achieve and maintain adequate cardiopulmonary perfusion will improve Outcome: Progressing

## 2017-11-25 NOTE — Progress Notes (Signed)
[place] for warfarin Indication: atrial fibrillation  Allergies  Allergen Reactions  . Cucumber Extract     Patient Measurements: Height: 6\' 1"  (185.4 cm) Weight: (!) 363 lb 14.4 oz (165.1 kg) IBW/kg (Calculated) : 79.9 Heparin Dosing Weight:  Vital Signs: Temp: 98 F (36.7 C) (03/24 0535) Temp Source: Oral (03/24 0535) BP: 136/78 (03/24 0535) Pulse Rate: 69 (03/24 0535)  Labs: Recent Labs    11/23/17 0501 11/24/17 0510 11/25/17 0446  LABPROT 16.1* 18.3* 17.5*  INR 1.30 1.53 1.45  CREATININE 1.39* 1.32* 1.62*    Estimated Creatinine Clearance: 88 mL/min (A) (by C-G formula based on SCr of 1.62 mg/dL (H)).   Assessment: Patient is 52 y.o M presented to the ED on 3/18 with c/o SOB and was found to be in afib. Chest CTA neg for PE.  Heparin drip started on admission for afib. Cardiology team indicates plan is to anticoagulate patient for 3 weeks prior to DCCV.  Patient was transitioned to Eliquis in AM of 3/21 with dose given at 0500.  However, due to high BMI and to ensure med compliance prior to cardioversion, will transition to warfarin as this can be monitored using INR. Per Dr. Percival Spanish, no bridging is needed.  Today, 11/25/2017: - CBC relatively stable, last drawn 3/21 - INR is 1.45, subtherapeutic  - no bleeding documented - no significant drug-drug intxns   Goal of Therapy:  INR 2-3 Monitor platelets by anticoagulation protocol: Yes   Plan:  - Give warfarin 12.5 mg PO x1 today - daily INR  - monitor for s/s bleeding  Royetta Asal, PharmD, BCPS Pager [phone] 11/25/2017 10:49 AM

## 2017-11-25 NOTE — Progress Notes (Signed)
Progress Note  Patient Name: Joe Blake Date of Encounter: 11/25/2017  Primary Cardiologist: Minus Breeding, MD   Subjective   Slept well and flat no dyspnea   Inpatient Medications    Scheduled Meds: . carvedilol  6.25 mg Oral BID WC  . [START ON 11/26/2017] furosemide  60 mg Oral Daily  . sodium chloride flush  3 mL Intravenous Q12H  . warfarin   Does not apply Once  . Warfarin - Pharmacist Dosing Inpatient   Does not apply q1800   Continuous Infusions: . sodium chloride     PRN Meds: sodium chloride, acetaminophen, ondansetron (ZOFRAN) IV, sodium chloride flush   Vital Signs    Vitals:   11/24/17 1513 11/24/17 1744 11/24/17 2112 11/25/17 0535  BP: 121/62 135/71 110/67 136/78  Pulse: (!) 44 66 90 69  Resp: 18  17 17   Temp: 97.6 F (36.4 C)  98 F (36.7 C) 98 F (36.7 C)  TempSrc: Oral  Axillary Oral  SpO2: 98%  96% 98%  Weight:    (!) 363 lb 14.4 oz (165.1 kg)  Height:        Intake/Output Summary (Last 24 hours) at 11/25/2017 0817 Last data filed at 11/25/2017 0651 Gross per 24 hour  Intake 840 ml  Output 2975 ml  Net -2135 ml   Filed Weights   11/23/17 0506 11/24/17 0606 11/25/17 0535  Weight: (!) 369 lb 8 oz (167.6 kg) (!) 363 lb 3.2 oz (164.7 kg) (!) 363 lb 14.4 oz (165.1 kg)    Telemetry    Atrial fibrillation in the 80s - Personally Reviewed 11/25/2017   ECG    Afib nonspecific ST chagnes   Physical Exam   Affect appropriate Morbidly obese white male  HEENT: normal Neck supple with no adenopathy JVP normal no bruits no thyromegaly Lungs clear with no wheezing and good diaphragmatic motion Heart:  S1/S2 no murmur, no rub, gallop or click PMI normal Abdomen: benighn, BS positve, no tenderness, no AAA no bruit.  No HSM or HJR Distal pulses intact with no bruits Plus 2  Edema with erythema  Neuro non-focal Skin warm and dry No muscular weakness    Labs    Chemistry Recent Labs  Lab 11/19/17 2010  11/21/17 0544 11/22/17 0549  11/23/17 0501 11/24/17 0510 11/25/17 0446  NA 145   < > 141  142 139 140 138 139  K 4.2   < > 3.6  3.6 4.3 3.7 4.0 5.0  CL 110   < > 103  104 100* 99* 99* 99*  CO2 26   < > 28  28 28  32 30 30  GLUCOSE 110*   < > 115*  115* 96 109* 118* 114*  BUN 20   < > 20  20 21* 24* 27* 31*  CREATININE 1.55*   < > 1.34*  1.33* 1.38* 1.39* 1.32* 1.62*  CALCIUM 8.9   < > 8.3*  8.5* 8.6* 8.8* 8.9 8.9  PROT 6.9  --   --   --   --   --   --   ALBUMIN 3.7  --  3.3* 3.5 3.2*  --   --   AST 37  --   --   --   --   --   --   ALT <5*  --   --   --   --   --   --   ALKPHOS 84  --   --   --   --   --   --  BILITOT 1.0  --   --   --   --   --   --   GFRNONAA 51*   < > >60  >60 58* 58* >60 48*  GFRAA 59*   < > >60  >60 >60 >60 >60 56*  ANIONGAP 9   < > 10  10 11 9 9 10    < > = values in this interval not displayed.     Hematology Recent Labs  Lab 11/20/17 0806 11/21/17 0544 11/22/17 0549  WBC 6.9 6.5 7.3  RBC 4.29 4.25 4.57  HGB 12.5* 12.4* 13.5  HCT 41.0 40.9 43.0  MCV 95.6 96.2 94.1  MCH 29.1 29.2 29.5  MCHC 30.5 30.3 31.4  RDW 15.5 15.3 15.3  PLT 260 251 257    Cardiac EnzymesNo results for input(s): TROPONINI in the last 168 hours.  Recent Labs  Lab 11/19/17 2017  TROPIPOC 0.08     BNP Recent Labs  Lab 11/19/17 2010  BNP 248.5*     DDimer  Recent Labs  Lab 11/19/17 2010  DDIMER 1.21*     Radiology    No results found.  Cardiac Studies   Echocardiogram 11/20/17 Study Conclusions  - Left ventricle: The cavity size was moderately dilated. Wall   thickness was increased in a pattern of mild LVH. Left   ventricular geometry showed evidence of eccentric hypertrophy.   Systolic function was severely reduced. The estimated ejection   fraction was in the range of 20% to 25%. Severe diffuse   hypokinesis with no identifiable regional variations. Acoustic   contrast opacification revealed no evidence ofthrombus. - Aortic valve: There was mild regurgitation. -  Mitral valve: There was mild regurgitation directed centrally. - Left atrium: The atrium was severely dilated. - Right ventricle: The cavity size was moderately dilated. Systolic   function was mildly reduced. - Right atrium: The atrium was severely dilated. - Pericardium, extracardiac: A trivial pericardial effusion was   identified.   Patient Profile     52 y.o. male , morbidly obese with BMI 50, past medical history of  HTN and DM admitted 11/19/17 with DOE and was found to have AF with RVR and CHF.   Assessment & Plan    Acute systolic heart failure: EF 20-25%. Likely non ischemic Lisinopril increased yesterday  since Cr stable change to PO lasix  for d/c on Monday with outpatient f/u with Dr Warren Lacy  Newly diagnosed atrial fibrillation with RVR: No known previous diagnosis per pt. Started on coreg with rates in the 80's-90's, pharmacy recommended coumadin due to high weight/body fat. INR 1.45  hopefully Rx by Monday   Hypertension: improved with ACE   Diabetes type 2: management per primary team. A1c 5.9.  Renal insufficiency: Cr elevated with diuresis 1.62 changing to PO lasix today   High risk for sleep apnea: Pt states that he had trial of CPAP in hospital in [place] but could not tolerate the mask. Sleep apnea could be playing into his atrial fib and may make it hard to control. Ideally would recommend sleep study, but pt likely cannot afford it.    Jenkins Rouge

## 2017-11-26 ENCOUNTER — Telehealth: Payer: Self-pay

## 2017-11-26 LAB — BASIC METABOLIC PANEL
Anion gap: 10 (ref 5–15)
BUN: 30 mg/dL — AB (ref 6–20)
CHLORIDE: 100 mmol/L — AB (ref 101–111)
CO2: 29 mmol/L (ref 22–32)
CREATININE: 1.49 mg/dL — AB (ref 0.61–1.24)
Calcium: 9.1 mg/dL (ref 8.9–10.3)
GFR calc Af Amer: >60 mL/min (ref >60)
GFR calc non Af Amer: 53 mL/min — ABNORMAL LOW (ref >60)
GLUCOSE: 109 mg/dL — AB (ref 65–99)
Potassium: 4.4 mmol/L (ref 3.5–5.1)
SODIUM: 139 mmol/L (ref 135–145)

## 2017-11-26 LAB — PROTIME-INR
INR: 1.78
Prothrombin Time: 20.6 seconds — ABNORMAL HIGH (ref 11.4–15.2)

## 2017-11-26 MED ORDER — FUROSEMIDE 40 MG PO TABS: 40.0000 mg | ORAL_TABLET | Freq: Every day | ORAL | 1 refills | Status: DC

## 2017-11-26 MED ORDER — COLCHICINE 0.6 MG PO TABS: 0.6000 mg | ORAL_TABLET | Freq: Once | ORAL | 0 refills | Status: DC | PRN

## 2017-11-26 MED ORDER — WARFARIN SODIUM 5 MG PO TABS: 12.5000 mg | ORAL_TABLET | Freq: Once | ORAL | Status: DC

## 2017-11-26 MED ORDER — LISINOPRIL 5 MG PO TABS: 5.0000 mg | ORAL_TABLET | Freq: Every day | ORAL | Status: DC

## 2017-11-26 MED ORDER — LISINOPRIL 5 MG PO TABS: 5.0000 mg | ORAL_TABLET | Freq: Every day | ORAL | 1 refills | Status: DC

## 2017-11-26 MED ORDER — WARFARIN SODIUM 2.5 MG PO TABS: 10.0000 mg | ORAL_TABLET | Freq: Every day | ORAL | 0 refills | Status: DC

## 2017-11-26 MED ORDER — CARVEDILOL 6.25 MG PO TABS: 6.2500 mg | ORAL_TABLET | Freq: Two times a day (BID) | ORAL | 1 refills | Status: DC

## 2017-11-26 MED FILL — LISINOPRIL 5 MG TAB: 5 | 30 days supply | Qty: 30 | Fill #0

## 2017-11-26 MED FILL — CARVEDILOL 6.25 MG TABLET: 6.25 | 30 days supply | Qty: 60 | Fill #0

## 2017-11-26 MED FILL — WARFARIN NA 2.5 MG TAB: 2.5 | 30 days supply | Qty: 120 | Fill #0

## 2017-11-26 MED FILL — FUROSEMIDE 40 MG TAB: 40 | 30 days supply | Qty: 30 | Fill #0

## 2017-11-26 MED FILL — COLCHICINE 0.6 MG TABS: 0.6 | 10 days supply | Qty: 10 | Fill #0

## 2017-11-26 NOTE — Care Management Note (Signed)
Case Management Note  Patient Details  Name: Drayven Marchena MRN: [id number] Date of Birth: 01/06/1966  Subjective/Objective: Noted [facility] hospital f/u appt set, & coumadin clinic appt set. No further CM needs.                   Action/Plan:d/c home.   Expected Discharge Date:  11/26/17               Expected Discharge Plan:  Home/Self Care  In-House Referral:     Discharge [facility]  Post Acute Care Choice:    Choice offered to:     DME Arranged:    DME Agency:     HH Arranged:    [place] Agency:     Status of Service:  Completed, signed off  If discussed at [employer] of Stay Meetings, dates discussed:    Additional Comments:  Dessa Phi, RN 11/26/2017, 11:39 AM

## 2017-11-26 NOTE — Progress Notes (Signed)
Progress Note  Patient Name: Joe Blake Date of Encounter: 11/26/2017  Primary Cardiologist: Minus Breeding, MD   Subjective   Sitting in chair no complaints   Inpatient Medications    Scheduled Meds: . carvedilol  6.25 mg Oral BID WC  . furosemide  40 mg Oral Daily  . sodium chloride flush  3 mL Intravenous Q12H  . warfarin  12.5 mg Oral ONCE-1800  . warfarin   Does not apply Once  . Warfarin - Pharmacist Dosing Inpatient   Does not apply q1800   Continuous Infusions: . sodium chloride     PRN Meds: sodium chloride, acetaminophen, colchicine, ondansetron (ZOFRAN) IV, sodium chloride flush   Vital Signs    Vitals:   11/25/17 0535 11/25/17 1340 11/25/17 2042 11/26/17 0443  BP: 136/78 114/80 128/87 (!) 150/100  Pulse: 69 79 85 86  Resp: 17 18 20 19   Temp: 98 F (36.7 C) 98.2 F (36.8 C) 98.2 F (36.8 C) 97.9 F (36.6 C)  TempSrc: Oral Oral Oral Oral  SpO2: 98% 100% 96% 96%  Weight: (!) 363 lb 14.4 oz (165.1 kg)   (!) 363 lb 12.1 oz (165 kg)  Height:        Intake/Output Summary (Last 24 hours) at 11/26/2017 1112 Last data filed at 11/26/2017 0500 Gross per 24 hour  Intake -  Output 500 ml  Net -500 ml   Filed Weights   11/24/17 0606 11/25/17 0535 11/26/17 0443  Weight: (!) 363 lb 3.2 oz (164.7 kg) (!) 363 lb 14.4 oz (165.1 kg) (!) 363 lb 12.1 oz (165 kg)    Telemetry    Atrial fibrillation in the 80s - Personally Reviewed 11/26/2017   ECG    Afib nonspecific ST chagnes   Physical Exam   Affect appropriate Morbidly obese white male  HEENT: normal Neck supple with no adenopathy JVP normal no bruits no thyromegaly Lungs clear with no wheezing and good diaphragmatic motion Heart:  S1/S2 no murmur, no rub, gallop or click PMI normal Abdomen: benighn, BS positve, no tenderness, no AAA no bruit.  No HSM or HJR Distal pulses intact with no bruits Plus 2  Edema with erythema  Neuro non-focal Skin warm and dry No muscular weakness    Labs      Chemistry Recent Labs  Lab 11/19/17 2010  11/21/17 0544 11/22/17 0549 11/23/17 0501 11/24/17 0510 11/25/17 0446 11/26/17 0449  NA 145   < > 141  142 139 140 138 139 139  K 4.2   < > 3.6  3.6 4.3 3.7 4.0 5.0 4.4  CL 110   < > 103  104 100* 99* 99* 99* 100*  CO2 26   < > 28  28 28  32 30 30 29   GLUCOSE 110*   < > 115*  115* 96 109* 118* 114* 109*  BUN 20   < > 20  20 21* 24* 27* 31* 30*  CREATININE 1.55*   < > 1.34*  1.33* 1.38* 1.39* 1.32* 1.62* 1.49*  CALCIUM 8.9   < > 8.3*  8.5* 8.6* 8.8* 8.9 8.9 9.1  PROT 6.9  --   --   --   --   --   --   --   ALBUMIN 3.7  --  3.3* 3.5 3.2*  --   --   --   AST 37  --   --   --   --   --   --   --  ALT <5*  --   --   --   --   --   --   --   ALKPHOS 84  --   --   --   --   --   --   --   BILITOT 1.0  --   --   --   --   --   --   --   GFRNONAA 51*   < > >60  >60 58* 58* >60 48* 53*  GFRAA 59*   < > >60  >60 >60 >60 >60 56* >60  ANIONGAP 9   < > 10  10 11 9 9 10 10    < > = values in this interval not displayed.     Hematology Recent Labs  Lab 11/20/17 0806 11/21/17 0544 11/22/17 0549  WBC 6.9 6.5 7.3  RBC 4.29 4.25 4.57  HGB 12.5* 12.4* 13.5  HCT 41.0 40.9 43.0  MCV 95.6 96.2 94.1  MCH 29.1 29.2 29.5  MCHC 30.5 30.3 31.4  RDW 15.5 15.3 15.3  PLT 260 251 257    Cardiac EnzymesNo results for input(s): TROPONINI in the last 168 hours.  Recent Labs  Lab 11/19/17 2017  TROPIPOC 0.08     BNP Recent Labs  Lab 11/19/17 2010  BNP 248.5*     DDimer  Recent Labs  Lab 11/19/17 2010  DDIMER 1.21*     Radiology    No results found.  Cardiac Studies   Echocardiogram 11/20/17 Study Conclusions  - Left ventricle: The cavity size was moderately dilated. Wall   thickness was increased in a pattern of mild LVH. Left   ventricular geometry showed evidence of eccentric hypertrophy.   Systolic function was severely reduced. The estimated ejection   fraction was in the range of 20% to 25%. Severe diffuse    hypokinesis with no identifiable regional variations. Acoustic   contrast opacification revealed no evidence ofthrombus. - Aortic valve: There was mild regurgitation. - Mitral valve: There was mild regurgitation directed centrally. - Left atrium: The atrium was severely dilated. - Right ventricle: The cavity size was moderately dilated. Systolic   function was mildly reduced. - Right atrium: The atrium was severely dilated. - Pericardium, extracardiac: A trivial pericardial effusion was   identified.   Patient Profile     52 y.o. male , morbidly obese with BMI 50, past medical history of  HTN and DM admitted 11/19/17 with DOE and was found to have AF with RVR and CHF.   Assessment & Plan    Acute systolic heart failure: EF 20-25%. Likely non ischemic Primary service keeps stopping ACE Cr is ok only up when azotemic and on lower dose PO lasix now Start lisinopril 5 mg in addition to coreg and diuretic has TOC f/u with PA Kerin Ransom 12/10/17 then f/u [facility]   Newly diagnosed atrial fibrillation with RVR: No known previous diagnosis per pt. Started on coreg with rates in the 80's-90's, pharmacy recommended coumadin due to high weight/body fat. INR 1.78   hopefully Rx by Monday Ok to d/c home with no lovenox bridge have arranged coumadin clinic f/u Friday Pharmacy recommends 10 mg daily dose   Hypertension: improved with ACE   Diabetes type 2: management per primary team. A1c 5.9.  Renal insufficiency: Cr elevated with diuresis 1.49 changing to PO lasix today   High risk for sleep apnea: Pt states that he had trial of CPAP in hospital in [place] but could not tolerate the  mask. Sleep apnea could be playing into his atrial fib and may make it hard to control. Ideally would recommend sleep study, but pt likely cannot afford it.   D/C home today    Jenkins Rouge

## 2017-11-26 NOTE — Discharge Summary (Signed)
Physician Discharge Summary  Joe Blake CZY:[id number] DOB: 02-19-1966 DOA: 11/19/2017  PCP: Patient, No Pcp Per  Admit date: 11/19/2017 Discharge date: 11/26/2017  Admitted From: Home Disposition: Home  Recommendations for Outpatient Follow-up:  1. Follow up with PCP in 1-2 weeks 2. Please obtain BMP/CBC in one week 3. Please follow up with the Coumadin clinic to have your INR checked on 11/27/2017. 4. Please follow-up with your primary care doctor. 5. Please follow-up with the heart failure clinic.  Home Health: No Equipment/Devices: No  Discharge Condition: Stable CODE STATUS: Full Diet recommendation: Heart healthy, fluid restricted, 2 g sodium restricted.  Brief/Interim Summary:  #) Acute on chronic systolic heart failure: Patient apparently had known likely nonischemic congestive heart failure however had not seen physician for approximately 1 year.  He came in with acute exacerbation of chronic systolic heart failure.  His echo done on 11/20/2017 showed an EF of 20-25%, diffuse hypokinesis, dilated left atrium, moderately dilated right ventricle, dilated right atrium.  There was mild LVH as well.  Patient was given IV furosemide with good urine output.  Patient's weight was monitored daily, he was given a low sodium diet and fluid restricted.  He was discharged home on oral furosemide.  He was additionally discharged on carvedilol and lisinopril.  He was given outpatient follow-up with the heart failure clinic.  #) new onset persistent atrial fibrillation: Patient was admitted with new onset persistent atrial fibrillation.  He was started on carvedilol for both heart failure and atrial fibrillation rate control.  He was started on warfarin as his BMI was around 50 and he would not be a good candidate for the direct oral anticoagulants.  Patient was discharged with INR 1.7.  Patient was given outpatient follow-up with warfarin clinic with next INR check on 11/27/2017.  His goal INR is  between 2-3 for atrial fibrillation.  #) Acute kidney injury on stage III chronic kidney disease: Patient was admitted with mild AK I.  This improved with diuresis.  Prior to discharge patient developed mild AK I again and his furosemide was held and his ACE inhibitor was held.  On recheck the next day his creatinine had normalized to proximally 1.5.  He was discharged and told to continue oral furosemide and oral ACE inhibitor.  #) Type 2 diabetes: Patient's A1c was 5.9.  #) Morbid obesity: Patient was counseled on diet and exercise.  Discharge Diagnoses:  Principal Problem:   Acute on chronic systolic CHF (congestive heart failure) (HCC) Active Problems:   New onset a-fib (HCC)   DM2 (diabetes mellitus, type 2) (HCC)   HTN (hypertension)   Obesity, Class III, BMI 40-49.9 (morbid obesity) (HCC)   CKD (chronic kidney disease) stage 3, GFR 30-59 ml/min (HCC)   LBBB (left bundle branch block)   Morbid obesity (HCC)   Acute systolic HF (heart failure) (HCC)   At[facility] fibrillation [facility])    Discharge Instructions  Discharge Instructions    AMB referral to CHF clinic   Complete by:  As directed    Call MD for:  difficulty breathing, headache or visual disturbances   Complete by:  As directed    Call MD for:  persistant nausea and vomiting   Complete by:  As directed    Call MD for:  redness, tenderness, or signs of infection (pain, swelling, redness, odor or green/yellow discharge around incision site)   Complete by:  As directed    Call MD for:  severe uncontrolled pain   Complete by:  As directed    Call MD for:  temperature >100.4   Complete by:  As directed    Diet - low sodium heart healthy   Complete by:  As directed    Discharge instructions   Complete by:  As directed    Please follow-up with your PCP as soon as possible.  Please follow-up with heart failure clinic.  Please follow-up with the Coumadin clinic to get your Coumadin levels checked on Tuesday, 11/27/2017.   Please take your furosemide (Lasix) twice a day for 3 days if you notice more than 4 pounds of weight gain or worsening swelling of your legs.  Please call your heart failure doctor if you have to increase her dose of Lasix.   Increase activity slowly   Complete by:  As directed      Allergies as of 11/26/2017      Reactions   Cucumber Extract       Medication List    STOP taking these medications   naproxen sodium 220 MG tablet Commonly known as:  ALEVE     TAKE these medications   carvedilol 6.25 MG tablet Commonly known as:  COREG Take 1 tablet (6.25 mg total) by mouth 2 (two) times daily with a meal.   colchicine 0.6 MG tablet Take 1 tablet (0.6 mg total) by mouth once as needed (take 1.2mg  and then 0.6mg  1 hour later if still having symptoms).   furosemide 40 MG tablet Commonly known as:  LASIX Take 1 tablet (40 mg total) by mouth daily. Start taking on:  11/27/2017   lisinopril 5 MG tablet Commonly known as:  PRINIVIL,ZESTRIL Take 1 tablet (5 mg total) by mouth daily.   warfarin 2.5 MG tablet Commonly known as:  COUMADIN Take 4 tablets (10 mg total) by mouth daily at 6 PM.      Follow-up Information    Erlene Quan, PA-C Follow up on 12/10/2017.   Specialties:  Cardiology, Radiology Why:  Please arrive 15 minutes early for your 11:30 AM Cardiology appointment Contact information: [address] [place] [address] [phone]          Allergies  Allergen Reactions  . Cucumber Extract     Consultations:  Cardiology   Procedures/Studies: Dg Chest 2 View  Result Date: 11/19/2017 CLINICAL DATA:  Labored breathing, lower extremity swelling. EXAM: CHEST - 2 VIEW COMPARISON:  None. FINDINGS: Cardiomegaly. Lungs are clear. No pleural effusion or pneumothorax seen. Osseous structures about the chest are unremarkable. IMPRESSION: Cardiomegaly. No acute findings. No evidence of pneumonia or pulmonary edema. Electronically Signed   By: Franki Cabot M.D.   On: 11/19/2017 19:52   Ct Angio Chest Pe W And/or Wo Contrast  Result Date: 11/19/2017 CLINICAL DATA:  PE suspected, intermediate prob, positive D-dimer. Labored breathing. Waking/fluid retention. History of CHF. EXAM: CT ANGIOGRAPHY CHEST WITH CONTRAST TECHNIQUE: Multidetector CT imaging of the chest was performed using the standard protocol during bolus administration of intravenous contrast. Multiplanar CT image reconstructions and MIPs were obtained to evaluate the vascular anatomy. CONTRAST:  156mL ISOVUE-370 IOPAMIDOL (ISOVUE-370) INJECTION 76% COMPARISON:  Chest radiograph earlier this day. FINDINGS: Cardiovascular: Breathing motion artifact partially limits assessment. There are no filling defects within the pulmonary arteries to suggest pulmonary embolus. Multi chamber cardiomegaly. Thoracic aorta is normal in caliber. Contrast refluxes into the hepatic veins and IVC. Minimal pericardial fluid, physiologic versus small effusion. Mediastinum/Nodes: Multiple small mediastinal nodes all subcentimeter. Prominent right hilar nodes measuring 13 mm  short axis. Smaller left hilar nodes. Esophagus is decompressed. Lungs/Pleura: Small bilateral pleural effusions, right greater than left. Mild septal thickening at the lung bases. Heterogeneous ground-glass opacities in the upper lobes, detail obscured by motion, nonspecific. No confluent consolidation. No pulmonary mass. Trachea and mainstem bronchi not well evaluated due to breathing motion expiratory imaging. Upper Abdomen: Mild contrast refluxing into the hepatic veins and IVC. Small amount of perihepatic ascites. Musculoskeletal: There are no acute or suspicious osseous abnormalities. Review of the MIP images confirms the above findings. IMPRESSION: 1. No pulmonary embolus. 2. Findings suggest mild CHF with small bilateral pleural effusions, mild pulmonary edema and cardiomegaly. Mild contrast refluxing into the hepatic veins and IVC. Small  perihepatic ascites. 3. Heterogeneous attenuation/ground-glass opacities in the upper lobes, may be pulmonary edema but are nonspecific. Small airways disease is also considered. Breathing motion limits evaluation. 4. Mild mediastinal and right hilar adenopathy is likely reactive in the setting CHF Electronically Signed   By: Jeb Levering M.D.   On: 11/19/2017 22:46    11/20/2017 echo:- Left ventricle: The cavity size was moderately dilated. Wall   thickness was increased in a pattern of mild LVH. Left   ventricular geometry showed evidence of eccentric hypertrophy.   Systolic function was severely reduced. The estimated ejection   fraction was in the range of 20% to 25%. Severe diffuse   hypokinesis with no identifiable regional variations. Acoustic   contrast opacification revealed no evidence ofthrombus. - Aortic valve: There was mild regurgitation. - Mitral valve: There was mild regurgitation directed centrally. - Left atrium: The atrium was severely dilated. - Right ventricle: The cavity size was moderately dilated. Systolic   function was mildly reduced. - Right atrium: The atrium was severely dilated. - Pericardium, extracardiac: A trivial pericardial effusion was   identified.    Subjective:   Discharge Exam: Vitals:   11/25/17 2042 11/26/17 0443  BP: 128/87 (!) 150/100  Pulse: 85 86  Resp: 20 19  Temp: 98.2 F (36.8 C) 97.9 F (36.6 C)  SpO2: 96% 96%   Vitals:   11/25/17 0535 11/25/17 1340 11/25/17 2042 11/26/17 0443  BP: 136/78 114/80 128/87 (!) 150/100  Pulse: 69 79 85 86  Resp: 17 18 20 19   Temp: 98 F (36.7 C) 98.2 F (36.8 C) 98.2 F (36.8 C) 97.9 F (36.6 C)  TempSrc: Oral Oral Oral Oral  SpO2: 98% 100% 96% 96%  Weight: (!) 165.1 kg (363 lb 14.4 oz)   (!) 165 kg (363 lb 12.1 oz)  Height:       General exam: Appears calm and comfortable  Respiratory system: Clear to auscultation. Respiratory effort normal.  No crackles, wheezes,  rhonchi Cardiovascular system: Irregularly irregular, no murmurs rubs or gallops. Gastrointestinal system: Obese, soft, nontender, nondistended, plus bowel sounds Central nervous system: Alert and oriented.  Grossly intact Extremities: 2+ lower extremity edema patient Skin: Chronic venous stasis skin changes in lower extremities, brawny induration of skin Psychiatry: Judgement and insight appear normal. Mood & affect appropriate.     The results of significant diagnostics from this hospitalization (including imaging, microbiology, ancillary and laboratory) are listed below for reference.     Microbiology: No results found for this or any previous visit (from the past 240 hour(s)).   Labs: BNP (last 3 results) Recent Labs    11/19/17 2010  BNP 878.6*   Basic Metabolic Panel: Recent Labs  Lab 11/21/17 0544 11/22/17 0549 11/23/17 0501 11/24/17 0510 11/25/17 0446 11/26/17 0449  NA 141  142 139 140 138 139 139  K 3.6  3.6 4.3 3.7 4.0 5.0 4.4  CL 103  104 100* 99* 99* 99* 100*  CO2 28  28 28  32 30 30 29   GLUCOSE 115*  115* 96 109* 118* 114* 109*  BUN 20  20 21* 24* 27* 31* 30*  CREATININE 1.34*  1.33* 1.38* 1.39* 1.32* 1.62* 1.49*  CALCIUM 8.3*  8.5* 8.6* 8.8* 8.9 8.9 9.1  MG 1.9  --   --   --  2.3  --   PHOS 4.2 4.0 3.9  --   --   --    Liver Function Tests: Recent Labs  Lab 11/19/17 2010 11/21/17 0544 11/22/17 0549 11/23/17 0501  AST 37  --   --   --   ALT <5*  --   --   --   ALKPHOS 84  --   --   --   BILITOT 1.0  --   --   --   PROT 6.9  --   --   --   ALBUMIN 3.7 3.3* 3.5 3.2*   No results for input(s): LIPASE, AMYLASE in the last 168 hours. No results for input(s): AMMONIA in the last 168 hours. CBC: Recent Labs  Lab 11/19/17 2010 11/20/17 0806 11/21/17 0544 11/22/17 0549  WBC 8.5 6.9 6.5 7.3  NEUTROABS 6.0  --   --   --   HGB 13.5 12.5* 12.4* 13.5  HCT 41.2 41.0 40.9 43.0  MCV 93.6 95.6 96.2 94.1  PLT 249 260 251 257   Cardiac  Enzymes: No results for input(s): CKTOTAL, CKMB, CKMBINDEX, TROPONINI in the last 168 hours. BNP: Invalid input(s): POCBNP CBG: Recent Labs  Lab 11/21/17 1137 11/21/17 1610 11/21/17 2102 11/22/17 0742 11/22/17 1100  GLUCAP 147* 90 91 97 174*   D-Dimer No results for input(s): DDIMER in the last 72 hours. Hgb A1c No results for input(s): HGBA1C in the last 72 hours. Lipid Profile No results for input(s): CHOL, HDL, LDLCALC, TRIG, CHOLHDL, LDLDIRECT in the last 72 hours. Thyroid function studies No results for input(s): TSH, T4TOTAL, T3FREE, THYROIDAB in the last 72 hours.  Invalid input(s): FREET3 Anemia work up No results for input(s): VITAMINB12, FOLATE, FERRITIN, TIBC, IRON, RETICCTPCT in the last 72 hours. Urinalysis No results found for: COLORURINE, APPEARANCEUR, LABSPEC, [place], GLUCOSEU, HGBUR, BILIRUBINUR, KETONESUR, PROTEINUR, UROBILINOGEN, NITRITE, LEUKOCYTESUR Sepsis Labs Invalid input(s): PROCALCITONIN,  WBC,  LACTICIDVEN Microbiology No results found for this or any previous visit (from the past 240 hour(s)).   Time coordinating discharge: Over 30 minutes  SIGNED:   Cristy Folks, MD  [facility] 11/26/2017, 11:05 AM  If 7PM-7AM, please contact night-coverage [email or web address] Password [email or web address]

## 2017-11-26 NOTE — Progress Notes (Signed)
[place] transitional Coordinator liason will check on pt/inr for 11/27/17-await response.

## 2017-11-26 NOTE — Progress Notes (Signed)
[place] for warfarin Indication: atrial fibrillation  Allergies  Allergen Reactions  . Cucumber Extract     Patient Measurements: Height: 6\' 1"  (185.4 cm) Weight: (!) 363 lb 12.1 oz (165 kg) IBW/kg (Calculated) : 79.9 Heparin Dosing Weight:  Vital Signs: Temp: 97.9 F (36.6 C) (03/25 0443) Temp Source: Oral (03/25 0443) BP: 150/100 (03/25 0443) Pulse Rate: 86 (03/25 0443)  Labs: Recent Labs    11/24/17 0510 11/25/17 0446 11/26/17 0449  LABPROT 18.3* 17.5* 20.6*  INR 1.53 1.45 1.78  CREATININE 1.32* 1.62* 1.49*    Estimated Creatinine Clearance: 95.6 mL/min (A) (by C-G formula based on SCr of 1.49 mg/dL (H)).   Assessment: Patient is 52 y.o M presented to the ED on 3/18 with c/o SOB and was found to be in afib. Chest CTA neg for PE.  Heparin drip started on admission for afib. Cardiology team indicates plan is to anticoagulate patient for 3 weeks prior to DCCV.  Patient was transitioned to Eliquis in AM of 3/21 with dose given at 0500.  However, due to high BMI and to ensure med compliance prior to cardioversion, will transition to warfarin as this can be monitored using INR. Per Dr. Percival Spanish, no bridging is needed.  Today, 11/26/2017:  CBC relatively stable, last drawn 3/21  INR still subtherapeutic but rising appropriately  no bleeding documented  no significant drug-drug interactions  Eating at least 1 full meal daily  Goal of Therapy:  INR 2-3 Monitor platelets by anticoagulation protocol: Yes   Plan:   Repeat warfarin 12.5 mg PO x1 today  Patient nearly therapeutic; would discharge on 10 mg daily based on current INR trends  daily INR   monitor for s/s bleeding  Reuel Boom, PharmD, BCPS [phone] 11/26/2017, 9:54 AM

## 2017-11-26 NOTE — Telephone Encounter (Signed)
Call received from Dessa Phi, RN CM requesting a hospital follow up appointment for the patient @ [facility]. She also inquired about having the patient's INR done at [facility].   An appointment was scheduled for 12/03/17 @ 1400 and the information was placed on the AVS.  As per Theda Sers, [facility] it is preferable that the patient have his INR checked at the coumadin clinic as he is already scheduled to follow up with cardiology and as per the discharge summary, the patient is to follow up with the coumadin clinic for INR check.   Update provided to K. Mahabir, RN CM

## 2017-11-30 ENCOUNTER — Ambulatory Visit (INDEPENDENT_AMBULATORY_CARE_PROVIDER_SITE_OTHER): Payer: Self-pay | Admitting: Pharmacist

## 2017-11-30 DIAGNOSIS — Z7901 Long term (current) use of anticoagulants: Secondary | ICD-10-CM

## 2017-11-30 DIAGNOSIS — I4891 Unspecified atrial fibrillation: Secondary | ICD-10-CM

## 2017-11-30 LAB — POCT INR: INR: 3

## 2017-12-03 ENCOUNTER — Ambulatory Visit: Payer: Self-pay | Attending: Family Medicine | Admitting: Family Medicine

## 2017-12-03 ENCOUNTER — Encounter: Payer: Self-pay | Admitting: Family Medicine

## 2017-12-03 VITALS — BP 140/100 | HR 99 | Temp 97.4°F | Ht 73.0 in | Wt 374.8 lb

## 2017-12-03 DIAGNOSIS — I5023 Acute on chronic systolic (congestive) heart failure: Secondary | ICD-10-CM

## 2017-12-03 DIAGNOSIS — Z79899 Other long term (current) drug therapy: Secondary | ICD-10-CM | POA: Insufficient documentation

## 2017-12-03 DIAGNOSIS — R7303 Prediabetes: Secondary | ICD-10-CM

## 2017-12-03 DIAGNOSIS — I4891 Unspecified atrial fibrillation: Secondary | ICD-10-CM

## 2017-12-03 DIAGNOSIS — Z7901 Long term (current) use of anticoagulants: Secondary | ICD-10-CM | POA: Insufficient documentation

## 2017-12-03 DIAGNOSIS — M109 Gout, unspecified: Secondary | ICD-10-CM | POA: Insufficient documentation

## 2017-12-03 DIAGNOSIS — Z6841 Body Mass Index (BMI) 40.0 and over, adult: Secondary | ICD-10-CM | POA: Insufficient documentation

## 2017-12-03 DIAGNOSIS — I110 Hypertensive heart disease with heart failure: Secondary | ICD-10-CM | POA: Insufficient documentation

## 2017-12-03 DIAGNOSIS — E119 Type 2 diabetes mellitus without complications: Secondary | ICD-10-CM | POA: Insufficient documentation

## 2017-12-03 DIAGNOSIS — I10 Essential (primary) hypertension: Secondary | ICD-10-CM

## 2017-12-03 DIAGNOSIS — E118 Type 2 diabetes mellitus with unspecified complications: Secondary | ICD-10-CM

## 2017-12-03 DIAGNOSIS — M1A0790 Idiopathic chronic gout, unspecified ankle and foot, without tophus (tophi): Secondary | ICD-10-CM

## 2017-12-03 LAB — GLUCOSE, POCT (MANUAL RESULT ENTRY): POC Glucose: 92 mg/dl (ref 70–99)

## 2017-12-03 MED ORDER — COLCHICINE 0.6 MG PO TABS: 0.6000 mg | ORAL_TABLET | Freq: Once | ORAL | 0 refills | Status: DC | PRN

## 2017-12-03 MED FILL — !COLCRYS 0.6 MG TABLET: 0.6 MG | 3 days supply | Qty: 10 | Fill #0

## 2017-12-03 NOTE — Progress Notes (Deleted)
   Subjective:  Patient ID: Joe Blake, male    DOB: 10-Sep-1965  Age: 52 y.o. MRN: [id number]  CC: Hospitalization Follow-up and Establish Care   HPI [name] presents for ***  Outpatient Medications Prior to Visit  Medication Sig Dispense Refill  . carvedilol (COREG) 6.25 MG tablet Take 1 tablet (6.25 mg total) by mouth 2 (two) times daily with a meal. 60 tablet 1  . furosemide (LASIX) 40 MG tablet Take 1 tablet (40 mg total) by mouth daily. 40 tablet 1  . lisinopril (PRINIVIL,ZESTRIL) 5 MG tablet Take 1 tablet (5 mg total) by mouth daily. 30 tablet 1  . warfarin (COUMADIN) 2.5 MG tablet Take 4 tablets (10 mg total) by mouth daily at 6 PM. 120 tablet 0  . colchicine 0.6 MG tablet Take 1 tablet (0.6 mg total) by mouth once as needed (take 1.2mg  and then 0.6mg  1 hour later if still having symptoms). 30 tablet 0   No facility-administered medications prior to visit.     ROS Review of Systems  Objective:  BP (!) 140/100   Pulse 99   Temp (!) 97.4 F (36.3 C) (Oral)   Ht 6\' 1"  (1.854 m)   Wt (!) 374 lb 12.8 oz (170 kg)   SpO2 96%   BMI 49.45 kg/m   BP/Weight 12/03/2017 11/26/2017 4/96/7591  Systolic BP 638 466 -  Diastolic BP 599 86 -  Wt. (Lbs) 374.8 363.76 -  BMI 49.45 - 47.99    {CHL AMB CHW Note lists:210951020}  Physical Exam   CMP Latest Ref Rng & Units 11/26/2017 11/25/2017 11/24/2017  Glucose 65 - 99 mg/dL 109(H) 114(H) 118(H)  BUN 6 - 20 mg/dL 30(H) 31(H) 27(H)  Creatinine 0.61 - 1.24 mg/dL 1.49(H) 1.62(H) 1.32(H)  Sodium 135 - 145 mmol/L 139 139 138  Potassium 3.5 - 5.1 mmol/L 4.4 5.0 4.0  Chloride 101 - 111 mmol/L 100(L) 99(L) 99(L)  CO2 22 - 32 mmol/L 29 30 30   Calcium 8.9 - 10.3 mg/dL 9.1 8.9 8.9  Total Protein 6.5 - 8.1 g/dL - - -  Total Bilirubin 0.3 - 1.2 mg/dL - - -  Alkaline Phos 38 - 126 U/L - - -  AST 15 - 41 U/L - - -  ALT 17 - 63 U/L - - -    Lab Results  Component Value Date   HGBA1C 5.9 (H) 11/20/2017    Assessment & Plan:   1. New  onset a-fib (HCC) ***  2. Acute on chronic systolic CHF (congestive heart failure) (HCC) ***  3. Obesity, Class III, BMI 40-49.9 (morbid obesity) (HCC) ***  4. Idiopathic chronic gout of foot without tophus, unspecified laterality *** - colchicine 0.6 MG tablet; Take 1 tablet (0.6 mg total) by mouth once as needed (take 1.2mg  and then 0.6mg  1 hour later if still having symptoms).  Dispense: 30 tablet; Refill: 0  5. Prediabetes *** - POCT glucose (manual entry)   Meds ordered this encounter  Medications  . colchicine 0.6 MG tablet    Sig: Take 1 tablet (0.6 mg total) by mouth once as needed (take 1.2mg  and then 0.6mg  1 hour later if still having symptoms).    Dispense:  30 tablet    Refill:  0    Follow-up: Return in about 3 months (around 03/04/2018) for follow up of chronic medical conditions.   Charlott Rakes MD

## 2017-12-03 NOTE — Patient Instructions (Signed)

## 2017-12-03 NOTE — Progress Notes (Signed)
Patient would like a refill on Colchicine.

## 2017-12-04 ENCOUNTER — Inpatient Hospital Stay: Payer: Self-pay

## 2017-12-04 ENCOUNTER — Encounter: Payer: Self-pay | Admitting: Family Medicine

## 2017-12-05 NOTE — Progress Notes (Signed)
Subjective:  Patient ID: Joe Blake, male    DOB: [birth year]  Age: 52 y.o. MRN: [id number]  CC: Hospitalization Follow-up and Establish Care   HPI Joe Blake is a 52 year old male with history of hypertension, gout, congestive heart failure (EF 20-25%), newly diagnosed atrial fibrillation recently hospitalized at [facility] from 11/19/17 through 11/26/17 for acute on chronic systolic heart failure. He had not been followed by a physician for 1 year and had presented with worsening shortness of breath; BNP was 248 and 2D echo was as below: Study Conclusions  - Left ventricle: The cavity size was moderately dilated. Wall   thickness was increased in a pattern of mild LVH. Left   ventricular geometry showed evidence of eccentric hypertrophy.   Systolic function was severely reduced. The estimated ejection   fraction was in the range of 20% to 25%. Severe diffuse   hypokinesis with no identifiable regional variations. Acoustic   contrast opacification revealed no evidence ofthrombus. - Aortic valve: There was mild regurgitation. - Mitral valve: There was mild regurgitation directed centrally. - Left atrium: The atrium was severely dilated. - Right ventricle: The cavity size was moderately dilated. Systolic   function was mildly reduced. - Right atrium: The atrium was severely dilated. - Pericardium, extracardiac: A trivial pericardial effusion was   identified.  He was treated with IV Lasix with resulting diuresis. During his hospital course, he developed new onset atrial fibrillation and was started on carvedilol and Coumadin (deemed not to be a good candidate for annual oral anticoagulants due to high BMI of 49.5).  He did have mild acute kidney injury which improved with IV fluids. His condition improved and he was subsequently discharged.  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  He gets short of breath on moderate exertion, has  orthopnea or pedal edema.  He has not been checking his weights at home. He is requesting a prescription for colchicine which he uses as needed for gout flare and flares have been infrequent with the last flare about 5 weeks ago. His appointment with cardiology comes up next week.  Past Medical History:  Diagnosis Date  . Acute systolic HF (heart failure) ([place])   . Atrial fibrillation ([place])   . Diabetes mellitus without complication ([place])   . Hypertension   . Morbid obesity ([place])     Past Surgical History:  Procedure Laterality Date  . ABDOMINAL SURGERY    . APPENDECTOMY     at 52 years old      Allergies  Allergen Reactions  . Cucumber Extract      Outpatient Medications Prior to Visit  Medication Sig Dispense Refill  . carvedilol (COREG) 6.25 MG tablet Take 1 tablet (6.25 mg total) by mouth 2 (two) times daily with a meal. 60 tablet 1  . furosemide (LASIX) 40 MG tablet Take 1 tablet (40 mg total) by mouth daily. 40 tablet 1  . lisinopril (PRINIVIL,ZESTRIL) 5 MG tablet Take 1 tablet (5 mg total) by mouth daily. 30 tablet 1  . warfarin (COUMADIN) 2.5 MG tablet Take 4 tablets (10 mg total) by mouth daily at 6 PM. 120 tablet 0  . colchicine 0.6 MG tablet Take 1 tablet (0.6 mg total) by mouth once as needed (take 1.2mg  and then 0.6mg  1 hour later if still having symptoms). 30 tablet 0   No facility-administered medications prior to visit.     ROS Review of Systems General:  negative for fever, weight loss, appetite change Eyes: no visual symptoms. ENT: no ear symptoms, no sinus tenderness, no nasal congestion or sore throat. Neck: no pain  Respiratory: no wheezing, shortness of breath on moderate exertion, cough Cardiovascular: no chest pain, no dyspnea on exertion, no pedal edema, no orthopnea. Gastrointestinal: no abdominal pain, no diarrhea, no constipation Genito-Urinary: no urinary frequency, no dysuria, no polyuria. Hematologic: no bruising Endocrine: no cold or  heat intolerance Neurological: no headaches, no seizures, no tremors Musculoskeletal: no joint pains, no joint swelling Skin: no pruritus, no rash. Psychological: no depression, no anxiety,    Objective:  BP (!) 140/100   Pulse 99   Temp (!) 97.4 F (36.3 C) (Oral)   Ht 6\' 1"  (1.854 m)   Wt (!) 374 lb 12.8 oz (170 kg)   SpO2 96%   BMI 49.45 kg/m   BP/Weight 12/03/2017 11/26/2017 03/03/5283  Systolic BP 132 440 -  Diastolic BP 102 86 -  Wt. (Lbs) 374.8 363.76 -  BMI 49.45 - 47.99    Wt Readings from Last 3 Encounters:  12/03/17 (!) 374 lb 12.8 oz (170 kg)  11/26/17 (!) 363 lb 12.1 oz (165 kg)     Physical Exam  Constitutional: He is oriented to person, place, and time. He appears well-developed and well-nourished.  Neck: No JVD present.  Cardiovascular: Normal rate, normal heart sounds and intact distal pulses.  No murmur heard. Pulmonary/Chest: Effort normal and breath sounds normal. He has no wheezes. He has no rales. He exhibits no tenderness.  Abdominal: Soft. Bowel sounds are normal. He exhibits no distension and no mass. There is no tenderness.  Musculoskeletal: Normal range of motion.  Neurological: He is alert and oriented to person, place, and time.  Skin: Skin is warm and dry.  Psychiatric: He has a normal mood and affect.     CMP Latest Ref Rng & Units 11/26/2017 11/25/2017 11/24/2017  Glucose 65 - 99 mg/dL 109(H) 114(H) 118(H)  BUN 6 - 20 mg/dL 30(H) 31(H) 27(H)  Creatinine 0.61 - 1.24 mg/dL 1.49(H) 1.62(H) 1.32(H)  Sodium 135 - 145 mmol/L 139 139 138  Potassium 3.5 - 5.1 mmol/L 4.4 5.0 4.0  Chloride 101 - 111 mmol/L 100(L) 99(L) 99(L)  CO2 22 - 32 mmol/L 29 30 30   Calcium 8.9 - 10.3 mg/dL 9.1 8.9 8.9  Total Protein 6.5 - 8.1 g/dL - - -  Total Bilirubin 0.3 - 1.2 mg/dL - - -  Alkaline Phos 38 - 126 U/L - - -  AST 15 - 41 U/L - - -  ALT 17 - 63 U/L - - -    Lab Results  Component Value Date   HGBA1C 5.9 (H) 11/20/2017    Assessment & Plan:   1.  New onset a-fib [facility]) On anticoagulation with warfarin INR was 3.0 at the Coumadin clinic on 3/29 Continue Coreg for rate control Plan for future DC CV Follow-up with cardiology  2. Acute on chronic systolic CHF (congestive heart failure) (HCC) EF of 20-25% from echo of 11/2017 NYHA II-III Seems to have gained 11 pounds since discharge from will need to take into account his clothing but he seems to be euvolemic at this time Continue Lasix  3. Obesity, Class III, BMI 40-49.9 (morbid obesity) (HCC) Reduce portion sizes, avoid late meals Exercise as tolerated  4. Idiopathic chronic gout of foot without tophus, unspecified laterality No acute flares at this time Discussed low purine eating plan Lasix puts him at risk for gout  flares - colchicine 0.6 MG tablet; Take 1 tablet (0.6 mg total) by mouth once as needed (take 1.2mg  and then 0.6mg  1 hour later if still having symptoms).  Dispense: 30 tablet; Refill: 0  5. Prediabetes A1c 5.9 - POCT glucose (manual entry)   Meds ordered this encounter  Medications  . colchicine 0.6 MG tablet    Sig: Take 1 tablet (0.6 mg total) by mouth once as needed (take 1.2mg  and then 0.6mg  1 hour later if still having symptoms).    Dispense:  30 tablet    Refill:  0    Follow-up: Return in about 3 months (around 03/04/2018) for follow up of chronic medical conditions.   Charlott Rakes MD

## 2017-12-07 ENCOUNTER — Ambulatory Visit (INDEPENDENT_AMBULATORY_CARE_PROVIDER_SITE_OTHER): Payer: Self-pay | Admitting: Pharmacist Clinician (PhC)/ Clinical Pharmacy Specialist

## 2017-12-07 DIAGNOSIS — I4891 Unspecified atrial fibrillation: Secondary | ICD-10-CM

## 2017-12-07 DIAGNOSIS — Z7901 Long term (current) use of anticoagulants: Secondary | ICD-10-CM

## 2017-12-07 LAB — POCT INR: INR: 7.4

## 2017-12-07 NOTE — Patient Instructions (Signed)
Description   Hold warfarin until repeat INR on Monday.  If you have any bleeding issues over the next 3 days please go to the emergency room for evaluation

## 2017-12-10 ENCOUNTER — Ambulatory Visit (INDEPENDENT_AMBULATORY_CARE_PROVIDER_SITE_OTHER): Payer: Self-pay | Admitting: Pharmacist Clinician (PhC)/ Clinical Pharmacy Specialist

## 2017-12-10 ENCOUNTER — Encounter: Payer: Self-pay | Admitting: Cardiology

## 2017-12-10 ENCOUNTER — Ambulatory Visit (INDEPENDENT_AMBULATORY_CARE_PROVIDER_SITE_OTHER): Payer: Self-pay | Admitting: Cardiology

## 2017-12-10 VITALS — BP 160/98 | HR 64 | Ht 73.0 in | Wt 378.4 lb

## 2017-12-10 DIAGNOSIS — N183 Chronic kidney disease, stage 3 unspecified: Secondary | ICD-10-CM

## 2017-12-10 DIAGNOSIS — Z7901 Long term (current) use of anticoagulants: Secondary | ICD-10-CM

## 2017-12-10 DIAGNOSIS — I4891 Unspecified atrial fibrillation: Secondary | ICD-10-CM

## 2017-12-10 DIAGNOSIS — I5023 Acute on chronic systolic (congestive) heart failure: Secondary | ICD-10-CM

## 2017-12-10 DIAGNOSIS — I447 Left bundle-branch block, unspecified: Secondary | ICD-10-CM

## 2017-12-10 DIAGNOSIS — I480 Paroxysmal atrial fibrillation: Secondary | ICD-10-CM

## 2017-12-10 LAB — POCT INR: INR: 2

## 2017-12-10 MED ORDER — CARVEDILOL 6.25 MG PO TABS: 6.2500 mg | ORAL_TABLET | Freq: Two times a day (BID) | ORAL | 3 refills | Status: DC

## 2017-12-10 MED ORDER — CARVEDILOL 12.5 MG PO TABS: 12.5000 mg | ORAL_TABLET | Freq: Two times a day (BID) | ORAL | 3 refills | Status: DC

## 2017-12-10 NOTE — Assessment & Plan Note (Signed)
CHADS VASC=3, too obese for NOAC

## 2017-12-10 NOTE — Patient Instructions (Signed)
Medication Instructions:  Increase: Carvedilol (Coreg) 12.5 mg twice a day  Labwork: Your physician recommends that you return for lab: Today (BMP) Weekly INR check    Follow-Up: Your physician recommends that you schedule a follow-up appointment in: 2 Weeks with Kerin Ransom, PA   Any Other Special Instructions Will Be Listed Below (If Applicable).     If you need a refill on your cardiac medications before your next appointment, please call your pharmacy.

## 2017-12-10 NOTE — Assessment & Plan Note (Signed)
Controlled.  

## 2017-12-10 NOTE — Progress Notes (Signed)
12/10/2017 Joe Blake   [birth year]  [id number]  Primary Physician Charlott Rakes, MD Primary Cardiologist: Dr Percival Spanish  HPI:  52 y.o.morbidly obese (BMI 69) male with a history of HTN and DM admitted 11/19/17 with DOE and was found to have AF with RVR and CHF. His EF was 20-25%. Etiology not yet determined, presumably NICM.  He had been admitted [facility] hospital in [place] in 2013 and was told his heart was "pumping at 33%". No history of cath, or MI and he is a poor candidate for Myoview secondary to morbid obeisty. He was not aware of previous diagnosis of atrial fibrillation. His CHA2DS-Vasc score is 3 (HTN, DM II, CHF). He was started on Eliquis but Pharmacy has suggested Coumadin secondary to morbid obesity. He did say he had a sleep study in the past but could not tolerate C-pap per his history. He diuresed from 390 lbs to 378 lbs. He is in the office today for follow up. He feels pretty well. He denies orthopnea. He didn't take his medications yet today but he tells me this was secondary to an unusual circumstance. He has gout but this seems to be under control with colchicine.    Current Outpatient Medications  Medication Sig Dispense Refill  . carvedilol (COREG) 12.5 MG tablet Take 1 tablet (12.5 mg total) by mouth 2 (two) times daily with a meal. 180 tablet 3  . colchicine 0.6 MG tablet Take 1 tablet (0.6 mg total) by mouth once as needed (take 1.2mg  and then 0.6mg  1 hour later if still having symptoms). 30 tablet 0  . lisinopril (PRINIVIL,ZESTRIL) 5 MG tablet Take 1 tablet (5 mg total) by mouth daily. 30 tablet 1  . warfarin (COUMADIN) 2.5 MG tablet Take 4 tablets (10 mg total) by mouth daily at 6 PM. (Patient not taking: Reported on 12/10/2017) 120 tablet 0   No current facility-administered medications for this visit.     Allergies  Allergen Reactions  . Cucumber Extract     Past Medical History:  Diagnosis Date  . Acute systolic HF (heart failure) ([place])   .  Atrial fibrillation ([place])   . Diabetes mellitus without complication ([place])   . Hypertension   . Morbid obesity ([place])     Social History   Socioeconomic History  . Marital status: Single    Spouse name: Not on file  . Number of children: Not on file  . Years of education: Not on file  . Highest education level: Not on file  Occupational History  . Not on file  Social Needs  . Financial resource strain: Somewhat hard  . Food insecurity:    Worry: Patient refused    Inability: Patient refused  . Transportation needs:    Medical: No    Non-medical: No  Tobacco Use  . Smoking status: Never Smoker  . Smokeless tobacco: Never Used  Substance and Sexual Activity  . Alcohol use: No    Frequency: Never  . Drug use: No  . Sexual activity: Yes    Partners: Female  Lifestyle  . Physical activity:    Days per week: Patient refused    Minutes per session: Patient refused  . Stress: Only a little  Relationships  . Social connections:    Talks on phone: More than three times a week    Gets together: Twice a week    Attends religious service: Patient refused    Active member of club or organization: No    Attends  meetings of clubs or organizations: Never    Relationship status: Never married  . Intimate partner violence:    Fear of current or ex partner: Patient refused    Emotionally abused: Patient refused    Physically abused: Patient refused    Forced sexual activity: Patient refused  Other Topics Concern  . Not on file  Social History Narrative  . Not on file     Family History  Problem Relation Age of Onset  . Heart attack Paternal Grandfather   . Hyperlipidemia Mother      Review of Systems: General: negative for chills, fever, night sweats or weight changes.  Cardiovascular: negative for chest pain, dyspnea on exertion, edema, orthopnea, palpitations, paroxysmal nocturnal dyspnea or shortness of breath Dermatological: negative for rash Respiratory: negative  for cough or wheezing Urologic: negative for hematuria Abdominal: negative for nausea, vomiting, diarrhea, bright red blood per rectum, melena, or hematemesis Neurologic: negative for visual changes, syncope, or dizziness Recent gout All other systems reviewed and are otherwise negative except as noted above.    Blood pressure (!) 160/98, pulse 64, height 6\' 1"  (1.854 m), weight (!) 378 lb 6.4 oz (171.6 kg).  General appearance: alert, cooperative, no distress and morbidly obese Neck: no carotid bruit and no JVD Lungs: clear to auscultation bilaterally Heart: irregularly irregular rhythm Extremities: trace edema Skin: Skin color, texture, turgor normal. No rashes or lesions Neurologic: Grossly normal  He cut his hair and shaved his beard since he was discharged from the [place]:   Atrial fibrillation ([place]) New onset in setting of CHF when admitted 11/19/17  Acute on chronic systolic CHF (congestive heart failure) ([place]) Prior documented cardiomyopathy in [place] 2013- EF this admission 20-25%  HTN (hypertension) Controlled  CKD (chronic kidney disease) stage 3, GFR 30-59 ml/min (HCC) SCr 1.5 at discharge  Morbid obesity (HCC) BMI 50  Anticoagulated CHADS VASC=3, too obese for NOAC  LBBB (left bundle branch block) .  PLAN  I increased his Coreg to 12.5 mg BID, his HR was close to 100 in the office. He'll need three weeks of therapeutic INR then an attempt at cardioversion but I told him he may have a high chance of recurrence of AF if his B/P was not better controlled. I'll see him back before this.    I suspect he has sleep apnea but the pt insists he sleeps fine and he previously was not able to tolerate C-pap.   F/U echo for LVF after a couple of months in [facility].   Kerin Ransom PA-C 12/10/2017 3:07 PM

## 2017-12-10 NOTE — Patient Instructions (Signed)
Description   Restart today with 3 tablets daily.  Repeat INR in 1 week

## 2017-12-10 NOTE — Assessment & Plan Note (Signed)
BMI 50  

## 2017-12-10 NOTE — Assessment & Plan Note (Signed)
SCr 1.5 at discharge

## 2017-12-10 NOTE — Assessment & Plan Note (Signed)
New onset in setting of CHF when admitted 11/19/17

## 2017-12-10 NOTE — Assessment & Plan Note (Signed)
Prior documented cardiomyopathy in [place] 2013- EF this admission 20-25%

## 2017-12-11 LAB — BASIC METABOLIC PANEL
BUN/Creatinine Ratio: 14 (ref 9–20)
BUN: 19 mg/dL (ref 6–24)
CO2: 26 mmol/L (ref 20–29)
Calcium: 9.4 mg/dL (ref 8.7–10.2)
Chloride: 104 mmol/L (ref 96–106)
Creatinine, Ser: 1.4 mg/dL — ABNORMAL HIGH (ref 0.76–1.27)
GFR calc Af Amer: 67 mL/min/{1.73_m2} (ref >59)
GFR calc non Af Amer: 58 mL/min/{1.73_m2} — ABNORMAL LOW (ref >59)
Glucose: 102 mg/dL — ABNORMAL HIGH (ref 65–99)
Potassium: 5.6 mmol/L — ABNORMAL HIGH (ref 3.5–5.2)
Sodium: 141 mmol/L (ref 134–144)

## 2017-12-12 NOTE — Addendum Note (Signed)
Addended by: Leanord Asal T on: 12/12/2017 01:18 PM   Modules accepted: Orders

## 2017-12-17 ENCOUNTER — Ambulatory Visit (INDEPENDENT_AMBULATORY_CARE_PROVIDER_SITE_OTHER): Payer: Self-pay | Admitting: Pharmacist Clinician (PhC)/ Clinical Pharmacy Specialist

## 2017-12-17 DIAGNOSIS — Z7901 Long term (current) use of anticoagulants: Secondary | ICD-10-CM

## 2017-12-17 DIAGNOSIS — I4891 Unspecified atrial fibrillation: Secondary | ICD-10-CM

## 2017-12-17 LAB — POCT INR: INR: 2.9

## 2017-12-17 NOTE — Patient Instructions (Signed)
Description   Take 3 tablets daily except 2 tablets each Monday and Friday.  Repeat INR in 1 week

## 2017-12-24 ENCOUNTER — Ambulatory Visit (INDEPENDENT_AMBULATORY_CARE_PROVIDER_SITE_OTHER): Payer: Self-pay | Admitting: Pharmacist

## 2017-12-24 DIAGNOSIS — Z7901 Long term (current) use of anticoagulants: Secondary | ICD-10-CM

## 2017-12-24 DIAGNOSIS — I4891 Unspecified atrial fibrillation: Secondary | ICD-10-CM

## 2017-12-24 LAB — POCT INR: INR: 3.2

## 2017-12-24 MED FILL — FUROSEMIDE 40 MG TAB: 40 | 30 days supply | Qty: 30 | Fill #1

## 2017-12-24 MED FILL — !COLCRYS 0.6 MG TABLET: 0.6 MG | 3 days supply | Qty: 10 | Fill #1

## 2017-12-24 MED FILL — CARVEDILOL 12.5 MG TABLET: 12.5 | 30 days supply | Qty: 60 | Fill #0

## 2017-12-24 MED FILL — LISINOPRIL 5 MG TAB: 5 | 30 days supply | Qty: 30 | Fill #1

## 2018-01-02 ENCOUNTER — Encounter: Payer: Self-pay | Admitting: Cardiology

## 2018-01-02 ENCOUNTER — Ambulatory Visit (INDEPENDENT_AMBULATORY_CARE_PROVIDER_SITE_OTHER): Payer: Self-pay | Admitting: Pharmacist

## 2018-01-02 ENCOUNTER — Ambulatory Visit (INDEPENDENT_AMBULATORY_CARE_PROVIDER_SITE_OTHER): Payer: Self-pay | Admitting: Cardiology

## 2018-01-02 VITALS — BP 142/99 | HR 90 | Ht >= 80 in | Wt 383.0 lb

## 2018-01-02 DIAGNOSIS — I4891 Unspecified atrial fibrillation: Secondary | ICD-10-CM

## 2018-01-02 DIAGNOSIS — I480 Paroxysmal atrial fibrillation: Secondary | ICD-10-CM

## 2018-01-02 DIAGNOSIS — I10 Essential (primary) hypertension: Secondary | ICD-10-CM

## 2018-01-02 DIAGNOSIS — N289 Disorder of kidney and ureter, unspecified: Secondary | ICD-10-CM | POA: Insufficient documentation

## 2018-01-02 DIAGNOSIS — Z7901 Long term (current) use of anticoagulants: Secondary | ICD-10-CM

## 2018-01-02 DIAGNOSIS — I5023 Acute on chronic systolic (congestive) heart failure: Secondary | ICD-10-CM

## 2018-01-02 LAB — POCT INR: INR: 1.7

## 2018-01-02 MED ORDER — FUROSEMIDE 40 MG PO TABS: 40.0000 mg | ORAL_TABLET | Freq: Every day | ORAL | 3 refills | Status: DC

## 2018-01-02 MED ORDER — CARVEDILOL 25 MG PO TABS: 25.0000 mg | ORAL_TABLET | Freq: Two times a day (BID) | ORAL | 3 refills | Status: DC

## 2018-01-02 MED ORDER — LOSARTAN POTASSIUM 25 MG PO TABS: 25.0000 mg | ORAL_TABLET | Freq: Every day | ORAL | 3 refills | Status: DC

## 2018-01-02 MED ORDER — WARFARIN SODIUM 2.5 MG PO TABS: ORAL_TABLET | ORAL | 0 refills | Status: DC

## 2018-01-02 MED FILL — LOSARTAN POTASSIUM 25 MG TA: 25 | 30 days supply | Qty: 30 | Fill #0

## 2018-01-02 MED FILL — CARVEDILOL 25 MG TABS: 25 | 30 days supply | Qty: 60 | Fill #0

## 2018-01-02 NOTE — Assessment & Plan Note (Signed)
New onset in setting of CHF when admitted 11/19/17 Rate 90-110

## 2018-01-02 NOTE — Assessment & Plan Note (Signed)
Uncontrolled-142/99- no real change from his LOV.

## 2018-01-02 NOTE — Assessment & Plan Note (Addendum)
Prior documented cardiomyopathy in [place] 2013- EF this March 2019 20-25% His weight continues to go up- he is not taking his lasix daily

## 2018-01-02 NOTE — Assessment & Plan Note (Signed)
BMI 41 

## 2018-01-02 NOTE — Assessment & Plan Note (Signed)
CHADS VASC=3, too obese for NOAC Unfortunately his INR is not therapeutic today- will try again for another 3 weeks

## 2018-01-02 NOTE — Patient Instructions (Signed)
Medication Instructions:  DISCONTINUE Lisinopril START Losartan 25mg  Take 1 tablet once a day INCREASE Coreg to 25mg  Take 1 tablet twice a day TAKE Lasix DAILY    Labwork: Your physician recommends that you return for lab work in: TODAY-BMET  Testing/Procedures: NONE   Follow-Up: Your physician recommends that you schedule a follow-up appointment in: [place], PA-C  Any Other Special Instructions Will Be Listed Below (If Applicable).  If you need a refill on your cardiac medications before your next appointment, please call your pharmacy.

## 2018-01-02 NOTE — Progress Notes (Signed)
01/02/2018 Joe Blake   [birth year]  [id number]  Primary Physician Charlott Rakes, MD Primary Cardiologist: [place]  HPI:  52 y.o.morbidly obese (BMI 63) malewith a history of HTN and DM admitted 11/19/17 with DOE and was found to have AF with RVR and CHF. His EF was 20-25%. Etiology not yet determined, presumably NICM. He had been admitted [facility] hospital in [place] in 2013 and was told his heart was "pumping at 33%". No history of cath, or MI and he is a poor candidate for Myoview secondary to morbid obeisty. He was not aware of previous diagnosis of atrial fibrillation. His CHA2DS-Vasc score is 3 (HTN, DM II, CHF). He was started on Eliquis but Pharmacy has suggested Coumadin secondary to morbid obesity. He did say he had a sleep study in the past but could not tolerate C-pap. He diuresed from 390 lbs to 378 lbs. He was seen in the office 12/10/17 for follow up. The plan was to control his B/P and consider OP DCCV after 4 weeks of therapeutic INR.I increased his Coreg at that visit.  He is in the office today for follow up. His INR has been therapeutic for 3 weeks but today it's 1.7. He admits he is not taking his Lasix daily- maybe 3 times a week. He doesn't like to take it if he is going to be out. He also tells me the low dose lisinopril is giving him a dry cough "like it did before". He denies orthopnea. He has a feeling of "being full".    Current Outpatient Medications  Medication Sig Dispense Refill  . carvedilol (COREG) 25 MG tablet Take 1 tablet (25 mg total) by mouth 2 (two) times daily with a meal. 180 tablet 3  . colchicine 0.6 MG tablet Take 1 tablet (0.6 mg total) by mouth once as needed (take 1.2mg  and then 0.6mg  1 hour later if still having symptoms). 30 tablet 0  . furosemide (LASIX) 40 MG tablet Take 1 tablet (40 mg total) by mouth daily. 90 tablet 3  . warfarin (COUMADIN) 2.5 MG tablet Take 4 tablets (10 mg total) by mouth daily at 6 PM. 120 tablet 0  . losartan  (COZAAR) 25 MG tablet Take 1 tablet (25 mg total) by mouth daily. 90 tablet 3   No current facility-administered medications for this visit.     Allergies  Allergen Reactions  . Cucumber Extract     Past Medical History:  Diagnosis Date  . Acute systolic HF (heart failure) ([place])   . Atrial fibrillation ([place])   . Diabetes mellitus without complication ([place])   . Hypertension   . Morbid obesity ([place])     Social History   Socioeconomic History  . Marital status: Single    Spouse name: Not on file  . Number of children: Not on file  . Years of education: Not on file  . Highest education level: Not on file  Occupational History  . Not on file  Social Needs  . Financial resource strain: Somewhat hard  . Food insecurity:    Worry: Patient refused    Inability: Patient refused  . Transportation needs:    Medical: No    Non-medical: No  Tobacco Use  . Smoking status: Never Smoker  . Smokeless tobacco: Never Used  Substance and Sexual Activity  . Alcohol use: No    Frequency: Never  . Drug use: No  . Sexual activity: Yes    Partners: Female  Lifestyle  . Physical  activity:    Days per week: Patient refused    Minutes per session: Patient refused  . Stress: Only a little  Relationships  . Social connections:    Talks on phone: More than three times a week    Gets together: Twice a week    Attends religious service: Patient refused    Active member of club or organization: No    Attends meetings of clubs or organizations: Never    Relationship status: Never married  . Intimate partner violence:    Fear of current or ex partner: Patient refused    Emotionally abused: Patient refused    Physically abused: Patient refused    Forced sexual activity: Patient refused  Other Topics Concern  . Not on file  Social History Narrative  . Not on file     Family History  Problem Relation Age of Onset  . Heart attack Paternal Grandfather   . Hyperlipidemia Mother       Review of Systems: General: negative for chills, fever, night sweats or weight changes.  Cardiovascular: negative for chest pain, edema, orthopnea, palpitations Dermatological: negative for rash Respiratory: negative for cough or wheezing Urologic: negative for hematuria Abdominal: negative for nausea, vomiting, diarrhea, bright red blood per rectum, melena, or hematemesis Neurologic: negative for visual changes, syncope, or dizziness All other systems reviewed and are otherwise negative except as noted above.   Blood pressure (!) 142/99, pulse 90, height 6\' 9"  (2.057 m), weight (!) 383 lb (173.7 kg).  General appearance: alert, cooperative, no distress and morbidly obese Lungs: clear to auscultation bilaterally Heart: irregularly irregular rhythm Extremities: trace edema Skin: cool and moist Neurologic: Grossly normal   ASSESSMENT AND PLAN:   Acute on chronic systolic CHF (congestive heart failure) ([place]) Prior documented cardiomyopathy in [place] 2013- EF this March 2019 20-25% His weight continues to go up- he is not taking his lasix daily  Atrial fibrillation ([place]) New onset in setting of CHF when admitted 11/19/17 Rate 90-110  Anticoagulated CHADS VASC=3, too obese for NOAC Unfortunately his INR is not therapeutic today- will try again for another 3 weeks  HTN (hypertension) Uncontrolled-142/99- no real change from his LOV.   Obesity, Class III, BMI 40-49.9 (morbid obesity) (HCC) BMI 41   PLAN  I strongly encouraged him to take the Lasix daily. I also suggested we stop the lisinopril since he is already having a dry cough at a low dose. Will increase his Coreg and add Losartan 25 mg. BMP in a week, OV with me in 3 weeks. Weekly INR. I would not be surprised if he ends back in the hospital before we get a chance to cardiovert him.   Kerin Ransom PA-C 01/02/2018 2:30 PM

## 2018-01-03 ENCOUNTER — Telehealth: Payer: Self-pay | Admitting: Cardiology

## 2018-01-03 MED ORDER — WARFARIN SODIUM 2.5 MG PO TABS: ORAL_TABLET | ORAL | 0 refills | Status: DC

## 2018-01-03 NOTE — Telephone Encounter (Signed)
Pt is callijng   Was in the office yesterday and was told a prescription for Warfarin would be at his pharmacy, but when he got there it wasn't there. The pharmacy is [facility] what the pt stated. Please call pt to confirm

## 2018-01-03 NOTE — Telephone Encounter (Signed)
rx sent to [facility].  Patient aware

## 2018-01-04 MED FILL — WARFARIN NA 2.5 MG TAB: 2.5 | 30 days supply | Qty: 180 | Fill #0

## 2018-01-09 ENCOUNTER — Ambulatory Visit (INDEPENDENT_AMBULATORY_CARE_PROVIDER_SITE_OTHER): Payer: Self-pay | Admitting: Pharmacist

## 2018-01-09 DIAGNOSIS — Z7901 Long term (current) use of anticoagulants: Secondary | ICD-10-CM

## 2018-01-09 DIAGNOSIS — I4891 Unspecified atrial fibrillation: Secondary | ICD-10-CM

## 2018-01-09 DIAGNOSIS — I480 Paroxysmal atrial fibrillation: Secondary | ICD-10-CM

## 2018-01-09 LAB — POCT INR: INR: 1.5

## 2018-01-10 LAB — BASIC METABOLIC PANEL
BUN/Creatinine Ratio: 13 (ref 9–20)
BUN: 20 mg/dL (ref 6–24)
CO2: 25 mmol/L (ref 20–29)
Calcium: 9.2 mg/dL (ref 8.7–10.2)
Chloride: 106 mmol/L (ref 96–106)
Creatinine, Ser: 1.58 mg/dL — ABNORMAL HIGH (ref 0.76–1.27)
GFR calc Af Amer: 58 mL/min/{1.73_m2} — ABNORMAL LOW (ref >59)
GFR calc non Af Amer: 50 mL/min/{1.73_m2} — ABNORMAL LOW (ref >59)
Glucose: 151 mg/dL — ABNORMAL HIGH (ref 65–99)
Potassium: 5.1 mmol/L (ref 3.5–5.2)
Sodium: 146 mmol/L — ABNORMAL HIGH (ref 134–144)

## 2018-01-16 ENCOUNTER — Ambulatory Visit (INDEPENDENT_AMBULATORY_CARE_PROVIDER_SITE_OTHER): Payer: Self-pay | Admitting: Pharmacist Clinician (PhC)/ Clinical Pharmacy Specialist

## 2018-01-16 DIAGNOSIS — I4891 Unspecified atrial fibrillation: Secondary | ICD-10-CM

## 2018-01-16 DIAGNOSIS — I480 Paroxysmal atrial fibrillation: Secondary | ICD-10-CM

## 2018-01-16 DIAGNOSIS — Z7901 Long term (current) use of anticoagulants: Secondary | ICD-10-CM

## 2018-01-16 LAB — POCT INR: INR: 1.4

## 2018-01-16 NOTE — Patient Instructions (Signed)
Description   Take 4 tablets (10 mg) Wednesday May 15 and Thursday May 16, then increase dose to 3 tablets daily except 4 tablets each Monday.  Repeat INR in 1 week

## 2018-01-23 ENCOUNTER — Ambulatory Visit: Payer: Self-pay | Admitting: Cardiology

## 2018-02-01 MED FILL — CARVEDILOL 25 MG TABLET: 25 | 30 days supply | Qty: 60 | Fill #1

## 2018-02-01 MED FILL — !COLCRYS 0.6 MG TABLET: 0.6 MG | 3 days supply | Qty: 10 | Fill #2

## 2018-02-01 MED FILL — LOSARTAN POTASSIUM 25 MG TA: 25 | 30 days supply | Qty: 30 | Fill #1

## 2018-02-01 MED FILL — FUROSEMIDE 40 MG TAB: 40 | 20 days supply | Qty: 20 | Fill #2

## 2018-02-04 ENCOUNTER — Ambulatory Visit (INDEPENDENT_AMBULATORY_CARE_PROVIDER_SITE_OTHER): Payer: Self-pay | Admitting: Pharmacist

## 2018-02-04 DIAGNOSIS — Z7901 Long term (current) use of anticoagulants: Secondary | ICD-10-CM

## 2018-02-04 DIAGNOSIS — I4891 Unspecified atrial fibrillation: Secondary | ICD-10-CM

## 2018-02-04 LAB — POCT INR: INR: 2.5 (ref 2.0–3.0)

## 2018-02-04 MED ORDER — WARFARIN SODIUM 2.5 MG PO TABS: ORAL_TABLET | ORAL | 1 refills | Status: DC

## 2018-02-15 MED FILL — WARFARIN NA 2.5 MG TAB: 2.5 | 30 days supply | Qty: 120 | Fill #0

## 2018-02-25 ENCOUNTER — Ambulatory Visit (INDEPENDENT_AMBULATORY_CARE_PROVIDER_SITE_OTHER): Payer: Self-pay | Admitting: Pharmacist

## 2018-02-25 ENCOUNTER — Ambulatory Visit (INDEPENDENT_AMBULATORY_CARE_PROVIDER_SITE_OTHER): Payer: Self-pay | Admitting: Cardiology

## 2018-02-25 ENCOUNTER — Encounter: Payer: Self-pay | Admitting: Cardiology

## 2018-02-25 VITALS — BP 142/90 | HR 84 | Ht 73.0 in | Wt 375.0 lb

## 2018-02-25 DIAGNOSIS — I4891 Unspecified atrial fibrillation: Secondary | ICD-10-CM

## 2018-02-25 DIAGNOSIS — Z01818 Encounter for other preprocedural examination: Secondary | ICD-10-CM

## 2018-02-25 DIAGNOSIS — Z7901 Long term (current) use of anticoagulants: Secondary | ICD-10-CM

## 2018-02-25 LAB — POCT INR: INR: 2.8 (ref 2.0–3.0)

## 2018-02-25 MED ORDER — RIVAROXABAN 20 MG PO TABS: 20.0000 mg | ORAL_TABLET | Freq: Every day | ORAL | 1 refills | Status: DC

## 2018-02-25 NOTE — Progress Notes (Addendum)
02/25/2018 Joe Blake   04-02-1966  [id number]  Primary Physician Charlott Rakes, MD Primary Cardiologist: Hochrein  HPI:  52 y.o.morbidly obese (BMI 6) malewith a history of HTN and DM admitted 11/19/17 with DOE and was found to have AF with RVR and CHF.HisEFwas 20-25%. Etiology not yet determined, presumably NICM. [facility] [place] [facility] and was told his heart was "pumping at 33%". No history of cath,orMI and he is a poor candidate for Myoviewsecondary to morbid obeisty. He was not aware of previous diagnosis of atrial fibrillation. His CHA2DS-Vasc score is 3 (HTN, DM II, CHF). He was started on Eliquis but Pharmacy has suggested Coumadinsecondary to morbid obesity. He did say he had a sleep study in the past but could not tolerate C-pap. He diuresed from 390 lbs to 378 lbs. He was seen in the office 12/10/17 for follow up. The plan was to control his B/P and consider OP DCCV after 4 weeks of therapeutic INR. Unfortunately we haven't been able to get this done. He has multiple reason, his car broke down, his cat got sick, he had to take a trip- ect. He also doesn't take his diuretic regularly and hasn't taken it in a couple of days. Overall he feels OK- he can tell he is starting to retain fluid (LE edema.), but denies any unusual dyspnea.      Current Outpatient Medications  Medication Sig Dispense Refill  . carvedilol (COREG) 25 MG tablet Take 1 tablet (25 mg total) by mouth 2 (two) times daily with a meal. 180 tablet 3  . colchicine 0.6 MG tablet Take 1 tablet (0.6 mg total) by mouth once as needed (take 1.2mg  and then 0.6mg  1 hour later if still having symptoms). 30 tablet 0  . furosemide (LASIX) 40 MG tablet Take 1 tablet (40 mg total) by mouth daily. 90 tablet 3  . losartan (COZAAR) 25 MG tablet Take 1 tablet (25 mg total) by mouth daily. 90 tablet 3  . warfarin (COUMADIN) 2.5 MG tablet Alternating 3 tablets and 4 tablets daily as  directed by coumadin clinic 360 tablet 1   No current facility-administered medications for this visit.     Allergies  Allergen Reactions  . Cucumber Extract   . Lisinopril Cough    Past Medical History:  Diagnosis Date  . Acute systolic HF (heart failure) ([place])   . Atrial fibrillation ([place])   . Diabetes mellitus without complication ([place])   . Hypertension   . Morbid obesity ([place])     Social History   Socioeconomic History  . Marital status: Single    Spouse name: Not on file  . Number of children: Not on file  . Years of education: Not on file  . Highest education level: Not on file  Occupational History  . Not on file  Social Needs  . Financial resource strain: Somewhat hard  . Food insecurity:    Worry: Patient refused    Inability: Patient refused  . Transportation needs:    Medical: No    Non-medical: No  Tobacco Use  . Smoking status: Never Smoker  . Smokeless tobacco: Never Used  Substance and Sexual Activity  . Alcohol use: No    Frequency: Never  . Drug use: No  . Sexual activity: Yes    Partners: Female  Lifestyle  . Physical activity:    Days per week: Patient refused    Minutes per session: Patient refused  . Stress: Only a  little  Relationships  . Social connections:    Talks on phone: More than three times a week    Gets together: Twice a week    Attends religious service: Patient refused    Active member of club or organization: No    Attends meetings of clubs or organizations: Never    Relationship status: Never married  . Intimate partner violence:    Fear of current or ex partner: Patient refused    Emotionally abused: Patient refused    Physically abused: Patient refused    Forced sexual activity: Patient refused  Other Topics Concern  . Not on file  Social History Narrative  . Not on file     Family History  Problem Relation Age of Onset  . Heart attack Paternal Grandfather   . Hyperlipidemia Mother      Review of  Systems: General: negative for chills, fever, night sweats or weight changes.  Cardiovascular: negative for chest pain, dyspnea on exertion, edema, orthopnea, palpitations, paroxysmal nocturnal dyspnea or shortness of breath Dermatological: negative for rash Respiratory: negative for cough or wheezing Urologic: negative for hematuria Abdominal: negative for nausea, vomiting, diarrhea, bright red blood per rectum, melena, or hematemesis Neurologic: negative for visual changes, syncope, or dizziness All other systems reviewed and are otherwise negative except as noted above.    Blood pressure (!) 142/90, pulse 84, height 6\' 1"  (1.854 m), weight (!) 375 lb (170.1 kg).  General appearance: alert, cooperative, no distress and morbidly obese Lungs: clear to auscultation bilaterally Heart: irregularly irregular rhythm Extremities: 2+ edema Skin: Skin color, texture, turgor normal. No rashes or lesions Neurologic: Grossly normal  EKG AF with VR 84, LVH  ASSESSMENT AND PLAN:    chronic systolic CHF (congestive heart failure) ([place]) Prior documented cardiomyopathy in [place] 2013- EF this March 2019 20-25% he is not taking his lasix daily but his weight is holding- 375 lbs today  Atrial fibrillation ([place]) New onset in setting of CHF when admitted 11/19/17 Rate 80's  Anticoagulated CHADS VASC=3, too obese for NOAC will try again for another 3 weeks of therapeutic INR- if unable consider trying a NOAC- (he would basically need this for free)  HTN (hypertension) Sub optimal control-142/88- no real change from his LOV. He is doing well with Cozaar- he had dry cough with Lisinopril  Obesity, Class III, BMI 40-49.9 (morbid obesity) (HCC) BMI 49   PLAN  Will check an INR today and try once again for three consecutive weeks of an INR > 2- then do DCCV. I encouraged him to take his Lasix daily  Kerin Ransom PA-C 02/25/2018 3:19 PM    Discussed with Pharmacy- we can supply him with Sheila Oats  for 6 weeks. I think he should be cardioverted sooner rather than later. I'll arrange for him to have his labs and be cardioverted in 3 weeks. He'll file paperwork for medication assistance in the meantime.   Kerin Ransom PA-C 02/25/2018 3:39 PM

## 2018-02-25 NOTE — Patient Instructions (Addendum)
Medication Instructions: Your physician recommends that you continue on your current medications as directed. Please refer to the Current Medication list given to you today.  If you need a refill on your cardiac medications before your next appointment, please call your pharmacy.   Follow-Up: Your physician wants you to follow-up in 4 weeks with Kerin Ransom, PA   Thank you for choosing [facility] at [facility]!!      Dear Joe Blake,  You are scheduled for a Cardioversion on 03/18/18 with Dr. Oval Linsey.  Please arrive at the [facility] (Main Entrance A) at [facility]: [address] [place], [place] [address] at 8 am. (1 hour prior to procedure unless lab work is needed; if lab work is needed arrive 1.5 hours ahead)  DIET: Nothing to eat or drink after midnight except a sip of water with medications (see medication instructions below)  Medication Instructions: Continue your anticoagulant: Xarelto You will need to continue your anticoagulant after your procedure until you  are told by your  Provider that it is safe to stop   Labs: Please have your labs drawn on 03/11/18 at the [facility] office or any lab corp.  You must have a responsible person to drive you home and stay in the waiting area during your procedure. Failure to do so could result in cancellation.  Bring your insurance cards.  *Special Note: Every effort is made to have your procedure done on time. Occasionally there are emergencies that occur at the hospital that may cause delays. Please be patient if a delay does occur.

## 2018-02-25 NOTE — H&P (View-Only) (Signed)
02/25/2018 Joe Blake   1966-07-29  [id number]  Primary Physician Charlott Rakes, MD Primary Cardiologist: Hochrein  HPI:  52 y.o.morbidly obese (BMI 6) malewith a history of HTN and DM admitted 11/19/17 with DOE and was found to have AF with RVR and CHF.HisEFwas 20-25%. Etiology not yet determined, presumably NICM. [facility] [place] [facility] and was told his heart was "pumping at 33%". No history of cath,orMI and he is a poor candidate for Myoviewsecondary to morbid obeisty. He was not aware of previous diagnosis of atrial fibrillation. His CHA2DS-Vasc score is 3 (HTN, DM II, CHF). He was started on Eliquis but Pharmacy has suggested Coumadinsecondary to morbid obesity. He did say he had a sleep study in the past but could not tolerate C-pap. He diuresed from 390 lbs to 378 lbs. He was seen in the office 12/10/17 for follow up. The plan was to control his B/P and consider OP DCCV after 4 weeks of therapeutic INR. Unfortunately we haven't been able to get this done. He has multiple reason, his car broke down, his cat got sick, he had to take a trip- ect. He also doesn't take his diuretic regularly and hasn't taken it in a couple of days. Overall he feels OK- he can tell he is starting to retain fluid (LE edema.), but denies any unusual dyspnea.      Current Outpatient Medications  Medication Sig Dispense Refill  . carvedilol (COREG) 25 MG tablet Take 1 tablet (25 mg total) by mouth 2 (two) times daily with a meal. 180 tablet 3  . colchicine 0.6 MG tablet Take 1 tablet (0.6 mg total) by mouth once as needed (take 1.2mg  and then 0.6mg  1 hour later if still having symptoms). 30 tablet 0  . furosemide (LASIX) 40 MG tablet Take 1 tablet (40 mg total) by mouth daily. 90 tablet 3  . losartan (COZAAR) 25 MG tablet Take 1 tablet (25 mg total) by mouth daily. 90 tablet 3  . warfarin (COUMADIN) 2.5 MG tablet Alternating 3 tablets and 4 tablets daily as  directed by coumadin clinic 360 tablet 1   No current facility-administered medications for this visit.     Allergies  Allergen Reactions  . Cucumber Extract   . Lisinopril Cough    Past Medical History:  Diagnosis Date  . Acute systolic HF (heart failure) ([place])   . Atrial fibrillation ([place])   . Diabetes mellitus without complication ([place])   . Hypertension   . Morbid obesity ([place])     Social History   Socioeconomic History  . Marital status: Single    Spouse name: Not on file  . Number of children: Not on file  . Years of education: Not on file  . Highest education level: Not on file  Occupational History  . Not on file  Social Needs  . Financial resource strain: Somewhat hard  . Food insecurity:    Worry: Patient refused    Inability: Patient refused  . Transportation needs:    Medical: No    Non-medical: No  Tobacco Use  . Smoking status: Never Smoker  . Smokeless tobacco: Never Used  Substance and Sexual Activity  . Alcohol use: No    Frequency: Never  . Drug use: No  . Sexual activity: Yes    Partners: Female  Lifestyle  . Physical activity:    Days per week: Patient refused    Minutes per session: Patient refused  . Stress: Only a  little  Relationships  . Social connections:    Talks on phone: More than three times a week    Gets together: Twice a week    Attends religious service: Patient refused    Active member of club or organization: No    Attends meetings of clubs or organizations: Never    Relationship status: Never married  . Intimate partner violence:    Fear of current or ex partner: Patient refused    Emotionally abused: Patient refused    Physically abused: Patient refused    Forced sexual activity: Patient refused  Other Topics Concern  . Not on file  Social History Narrative  . Not on file     Family History  Problem Relation Age of Onset  . Heart attack Paternal Grandfather   . Hyperlipidemia Mother      Review of  Systems: General: negative for chills, fever, night sweats or weight changes.  Cardiovascular: negative for chest pain, dyspnea on exertion, edema, orthopnea, palpitations, paroxysmal nocturnal dyspnea or shortness of breath Dermatological: negative for rash Respiratory: negative for cough or wheezing Urologic: negative for hematuria Abdominal: negative for nausea, vomiting, diarrhea, bright red blood per rectum, melena, or hematemesis Neurologic: negative for visual changes, syncope, or dizziness All other systems reviewed and are otherwise negative except as noted above.    Blood pressure (!) 142/90, pulse 84, height 6\' 1"  (1.854 m), weight (!) 375 lb (170.1 kg).  General appearance: alert, cooperative, no distress and morbidly obese Lungs: clear to auscultation bilaterally Heart: irregularly irregular rhythm Extremities: 2+ edema Skin: Skin color, texture, turgor normal. No rashes or lesions Neurologic: Grossly normal  EKG AF with VR 84, LVH  ASSESSMENT AND PLAN:    chronic systolic CHF (congestive heart failure) ([place]) Prior documented cardiomyopathy in [place] 2013- EF this March 2019 20-25% he is not taking his lasix daily but his weight is holding- 375 lbs today  Atrial fibrillation ([place]) New onset in setting of CHF when admitted 11/19/17 Rate 80's  Anticoagulated CHADS VASC=3, too obese for NOAC will try again for another 3 weeks of therapeutic INR- if unable consider trying a NOAC- (he would basically need this for free)  HTN (hypertension) Sub optimal control-142/88- no real change from his LOV. He is doing well with Cozaar- he had dry cough with Lisinopril  Obesity, Class III, BMI 40-49.9 (morbid obesity) (HCC) BMI 49   PLAN  Will check an INR today and try once again for three consecutive weeks of an INR > 2- then do DCCV. I encouraged him to take his Lasix daily  Kerin Ransom PA-C 02/25/2018 3:19 PM    Discussed with Pharmacy- we can supply him with Sheila Oats  for 6 weeks. I think he should be cardioverted sooner rather than later. I'll arrange for him to have his labs and be cardioverted in 3 weeks. He'll file paperwork for medication assistance in the meantime.   Kerin Ransom PA-C 02/25/2018 3:39 PM

## 2018-02-25 NOTE — Addendum Note (Signed)
Addended by: Ricci Barker on: 02/25/2018 03:50 PM   Modules accepted: Orders

## 2018-03-04 ENCOUNTER — Ambulatory Visit: Payer: Self-pay | Admitting: Family Medicine

## 2018-03-11 ENCOUNTER — Telehealth: Payer: Self-pay | Admitting: *Deleted

## 2018-03-11 NOTE — Telephone Encounter (Signed)
Message left with patient to remind him to get his pre procedure labs drawn.

## 2018-03-12 NOTE — Telephone Encounter (Signed)
Patient stated that he would have his lab work done tomorrow, 03/13/18, for his cardioversion.

## 2018-03-13 DIAGNOSIS — Z01818 Encounter for other preprocedural examination: Secondary | ICD-10-CM | POA: Diagnosis not present

## 2018-03-13 DIAGNOSIS — I4891 Unspecified atrial fibrillation: Secondary | ICD-10-CM | POA: Diagnosis not present

## 2018-03-14 LAB — BASIC METABOLIC PANEL
BUN/Creatinine Ratio: 16 (ref 9–20)
BUN: 20 mg/dL (ref 6–24)
CO2: 25 mmol/L (ref 20–29)
Calcium: 9.2 mg/dL (ref 8.7–10.2)
Chloride: 104 mmol/L (ref 96–106)
Creatinine, Ser: 1.26 mg/dL (ref 0.76–1.27)
GFR calc Af Amer: 76 mL/min/{1.73_m2} (ref >59)
GFR calc non Af Amer: 66 mL/min/{1.73_m2} (ref >59)
Glucose: 103 mg/dL — ABNORMAL HIGH (ref 65–99)
Potassium: 4.8 mmol/L (ref 3.5–5.2)
Sodium: 144 mmol/L (ref 134–144)

## 2018-03-14 LAB — CBC
Hematocrit: 42.9 % (ref 37.5–51.0)
Hemoglobin: 13.9 g/dL (ref 13.0–17.7)
MCH: 27.5 pg (ref 26.6–33.0)
MCHC: 32.4 g/dL (ref 31.5–35.7)
MCV: 85 fL (ref 79–97)
Platelets: 205 10*3/uL (ref 150–450)
RBC: 5.05 x10E6/uL (ref 4.14–5.80)
RDW: 15.2 % (ref 12.3–15.4)
WBC: 7.3 10*3/uL (ref 3.4–10.8)

## 2018-03-15 MED FILL — CARVEDILOL 25 MG TABLET: 25 | 30 days supply | Qty: 60 | Fill #2

## 2018-03-15 MED FILL — LOSARTAN POTASSIUM 25 MG TA: 25 | 30 days supply | Qty: 30 | Fill #2

## 2018-03-18 ENCOUNTER — Encounter (HOSPITAL_COMMUNITY)
Admission: RE | Disposition: A | Discharge status: Home / Self Care | Payer: Self-pay | Source: Ambulatory Visit | Attending: Cardiovascular Disease

## 2018-03-18 ENCOUNTER — Ambulatory Visit (HOSPITAL_COMMUNITY): Payer: Medicaid Other | Admitting: Anesthesiology

## 2018-03-18 ENCOUNTER — Ambulatory Visit (HOSPITAL_COMMUNITY)
Admission: RE | Admit: 2018-03-18 | Discharge: 2018-03-18 | Disposition: A | Discharge status: Home / Self Care | Payer: Medicaid Other | Source: Ambulatory Visit | Attending: Cardiovascular Disease | Admitting: Cardiovascular Disease

## 2018-03-18 ENCOUNTER — Encounter (HOSPITAL_COMMUNITY): Payer: Self-pay | Admitting: *Deleted

## 2018-03-18 DIAGNOSIS — Z6841 Body Mass Index (BMI) 40.0 and over, adult: Secondary | ICD-10-CM | POA: Diagnosis not present

## 2018-03-18 DIAGNOSIS — I4819 Other persistent atrial fibrillation: Secondary | ICD-10-CM

## 2018-03-18 DIAGNOSIS — Z7901 Long term (current) use of anticoagulants: Secondary | ICD-10-CM | POA: Diagnosis not present

## 2018-03-18 DIAGNOSIS — I110 Hypertensive heart disease with heart failure: Secondary | ICD-10-CM | POA: Insufficient documentation

## 2018-03-18 DIAGNOSIS — I5022 Chronic systolic (congestive) heart failure: Secondary | ICD-10-CM | POA: Diagnosis not present

## 2018-03-18 DIAGNOSIS — E119 Type 2 diabetes mellitus without complications: Secondary | ICD-10-CM | POA: Insufficient documentation

## 2018-03-18 DIAGNOSIS — I4891 Unspecified atrial fibrillation: Secondary | ICD-10-CM | POA: Insufficient documentation

## 2018-03-18 DIAGNOSIS — I481 Persistent atrial fibrillation: Secondary | ICD-10-CM

## 2018-03-18 HISTORY — PX: CARDIOVERSION: SHX1299

## 2018-03-18 SURGERY — CARDIOVERSION
Anesthesia: General

## 2018-03-18 MED ORDER — PROPOFOL 10 MG/ML IV BOLUS
INTRAVENOUS | Status: DC | PRN
  Administered 2018-03-18 (×2): 40 mg via INTRAVENOUS

## 2018-03-18 MED ORDER — SODIUM CHLORIDE 0.9% FLUSH: 3.0000 mL | INTRAVENOUS | Status: DC | PRN

## 2018-03-18 MED ORDER — SODIUM CHLORIDE 0.9 % IV SOLN
INTRAVENOUS | Status: DC
  Administered 2018-03-18: 09:00:00 via INTRAVENOUS

## 2018-03-18 NOTE — Anesthesia Preprocedure Evaluation (Addendum)
Anesthesia Evaluation  Patient identified by MRN, date of birth, ID band Patient awake    Reviewed: Allergy & Precautions, NPO status , Patient's Chart, lab work & pertinent test results, reviewed documented beta blocker date and time   History of Anesthesia Complications Negative for: history of anesthetic complications  Airway Mallampati: III  TM Distance: >3 FB Neck ROM: Full    Dental  (+) Teeth Intact   Pulmonary neg pulmonary ROS,    breath sounds clear to auscultation       Cardiovascular hypertension, Pt. on medications and Pt. on home beta blockers +CHF  + dysrhythmias Atrial Fibrillation  Rhythm:Irregular     Neuro/Psych negative neurological ROS  negative psych ROS   GI/Hepatic negative GI ROS, Neg liver ROS,   Endo/Other  diabetesMorbid obesity  Renal/GU Renal InsufficiencyRenal disease     Musculoskeletal   Abdominal   Peds  Hematology negative hematology ROS (+)   Anesthesia Other Findings Left ventricle: The cavity size was moderately dilated. Wall   thickness was increased in a pattern of mild LVH. Left   ventricular geometry showed evidence of eccentric hypertrophy.   Systolic function was severely reduced. The estimated ejection   fraction was in the range of 20% to 25%. Severe diffuse   hypokinesis with no identifiable regional variations. Acoustic   contrast opacification revealed no evidence ofthrombus. - Aortic valve: There was mild regurgitation. - Mitral valve: There was mild regurgitation directed centrally. - Left atrium: The atrium was severely dilated. - Right ventricle: The cavity size was moderately dilated. Systolic   function was mildly reduced. - Right atrium: The atrium was severely dilated. - Pericardium, extracardiac: A trivial pericardial effusion was   identified.  Reproductive/Obstetrics                            Anesthesia Physical Anesthesia  Plan  ASA: III  Anesthesia Plan: General   Post-op Pain Management:    Induction: Intravenous  PONV Risk Score and Plan: 2 and Treatment may vary due to age or medical condition  Airway Management Planned: Mask  Additional Equipment: None  Intra-op Plan:   Post-operative Plan:   Informed Consent: I have reviewed the patients History and Physical, chart, labs and discussed the procedure including the risks, benefits and alternatives for the proposed anesthesia with the patient or authorized representative who has indicated his/her understanding and acceptance.   Dental advisory given  Plan Discussed with: CRNA and Surgeon  Anesthesia Plan Comments:         Anesthesia Quick Evaluation

## 2018-03-18 NOTE — Transfer of Care (Signed)
Immediate Anesthesia Transfer of Care Note  Patient: Joe Blake  Procedure(s) Performed: CARDIOVERSION (N/A )  Patient Location: Endoscopy Unit  Anesthesia Type:General  Level of Consciousness: drowsy  Airway & Oxygen Therapy: Patient Spontanous Breathing  Post-op Assessment: Report given to RN and Post -op Vital signs reviewed and stable  Post vital signs: Reviewed and stable  Last Vitals:  Vitals Value Taken Time  BP    Temp    Pulse    Resp    SpO2      Last Pain:  Vitals:   03/18/18 0821  TempSrc: Oral  PainSc: 0-No pain         Complications: No apparent anesthesia complications

## 2018-03-18 NOTE — Discharge Instructions (Signed)
Electrical Cardioversion, Care After °This sheet gives you information about how to care for yourself after your procedure. Your health care provider may also give you more specific instructions. If you have problems or questions, contact your health care provider. °What can I expect after the procedure? °After the procedure, it is common to have: °· Some redness on the skin where the shocks were given. ° °Follow these instructions at home: °· Do not drive for 24 hours if you were given a medicine to help you relax (sedative). °· Take over-the-counter and prescription medicines only as told by your health care provider. °· Ask your health care provider how to check your pulse. Check it often. °· Rest for 48 hours after the procedure or as told by your health care provider. °· Avoid or limit your caffeine use as told by your health care provider. °Contact a health care provider if: °· You feel like your heart is beating too quickly or your pulse is not regular. °· You have a serious muscle cramp that does not go away. °Get help right away if: °· You have discomfort in your chest. °· You are dizzy or you feel faint. °· You have trouble breathing or you are short of breath. °· Your speech is slurred. °· You have trouble moving an arm or leg on one side of your body. °· Your fingers or toes turn cold or blue. °This information is not intended to replace advice given to you by your health care provider. Make sure you discuss any questions you have with your health care provider. °Document Released: 06/11/2013 Document Revised: 03/24/2016 Document Reviewed: 02/25/2016 °Elsevier Interactive Patient Education © 2018 [employer] ° °

## 2018-03-18 NOTE — Anesthesia Postprocedure Evaluation (Signed)
Anesthesia Post Note  Patient: Joe Blake  Procedure(s) Performed: CARDIOVERSION (N/A )     Patient location during evaluation: Endoscopy Anesthesia Type: General Level of consciousness: awake and alert Pain management: pain level controlled Vital Signs Assessment: post-procedure vital signs reviewed and stable Respiratory status: spontaneous breathing, nonlabored ventilation, respiratory function stable and patient connected to nasal cannula oxygen Cardiovascular status: blood pressure returned to baseline and stable Postop Assessment: no apparent nausea or vomiting Anesthetic complications: no    Last Vitals:  Vitals:   03/18/18 0922 03/18/18 0925  BP: (!) 144/84 140/68  Pulse: 71 72  Resp: 13 17  Temp:    SpO2: 97% 96%    Last Pain:  Vitals:   03/18/18 0925  TempSrc:   PainSc: 0-No pain                 Melissa Tomaselli

## 2018-03-18 NOTE — Anesthesia Procedure Notes (Signed)
Procedure Name: General with mask airway Date/Time: 03/18/2018 9:07 AM Performed by: Colin Benton, CRNA Pre-anesthesia Checklist: Patient identified, Emergency Drugs available, Suction available and Patient being monitored Patient Re-evaluated:Patient Re-evaluated prior to induction Oxygen Delivery Method: Ambu bag Preoxygenation: Pre-oxygenation with 100% oxygen Induction Type: IV induction Placement Confirmation: positive ETCO2

## 2018-03-18 NOTE — Interval H&P Note (Signed)
History and Physical Interval Note:  03/18/2018 9:05 AM  Joe Blake  has presented today for surgery, with the diagnosis of A-FIB  The various methods of treatment have been discussed with the patient and family. After consideration of risks, benefits and other options for treatment, the patient has consented to  Procedure(s): CARDIOVERSION (N/A) as a surgical intervention .  The patient's history has been reviewed, patient examined, no change in status, stable for surgery.  I have reviewed the patient's chart and labs.  Questions were answered to the patient's satisfaction.     Skeet Latch, MD 03/18/2018 9:05 AM

## 2018-03-18 NOTE — CV Procedure (Signed)
Electrical Cardioversion Procedure Note Joe Blake [id number] 1966-03-20  Procedure: Electrical Cardioversion Indications:  Atrial Fibrillation  Procedure Details Consent: Risks of procedure as well as the alternatives and risks of each were explained to the (patient/caregiver).  Consent for procedure obtained. Time Out: Verified patient identification, verified procedure, site/side was marked, verified correct patient position, special equipment/implants available, medications/allergies/relevent history reviewed, required imaging and test results available.  Performed  Patient placed on cardiac monitor, pulse oximetry, supplemental oxygen as necessary.  Sedation given: propofol Pacer pads placed anterior and posterior chest.  Cardioverted 1 time(s).  Cardioverted at [address] [place].  Evaluation Findings: Post procedure EKG shows: sinus bradycardia Complications: None Patient did tolerate procedure well.   Skeet Latch, MD 03/18/2018, 9:11 AM

## 2018-04-09 IMAGING — CT CT ANGIO CHEST
2 of 6 series · 18 of 46 positions shown · IV contrast (ISOVUE)
Comparison: Chest radiograph earlier this day.

CLINICAL DATA: PE suspected, intermediate prob, positive D-dimer.
Labored breathing. Waking/fluid retention. History of CHF.

EXAM:
CT ANGIOGRAPHY CHEST WITH CONTRAST
TECHNIQUE: Multidetector CT imaging of the chest was performed using the
standard protocol during bolus administration of intravenous
contrast. Multiplanar CT image reconstructions and MIPs were
obtained to evaluate the vascular anatomy.
CONTRAST:  100mL DUQ43U-57N IOPAMIDOL (DUQ43U-57N) INJECTION 76%

[Series 5: thins · axial · 0.93mm/px · z∈[+1317,+1611]mm · 15 of 324 slices shown]
[im 15/324  lung]
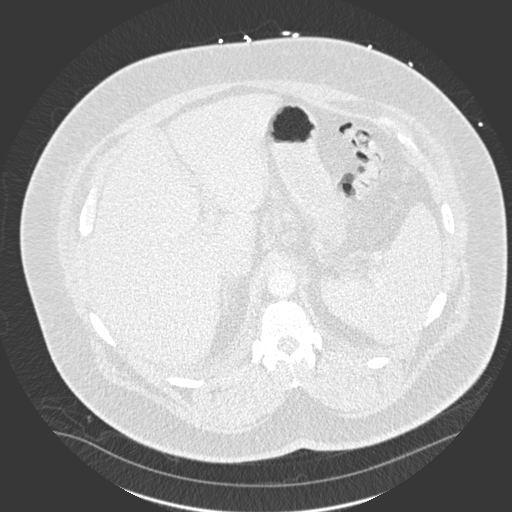
[im 43/324  soft-tissue]
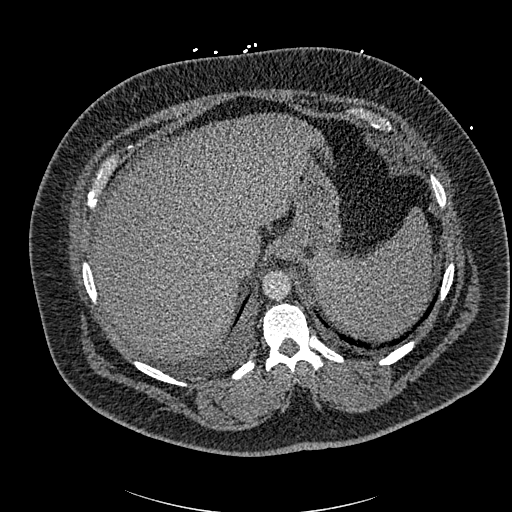
[im 57/324  lung]
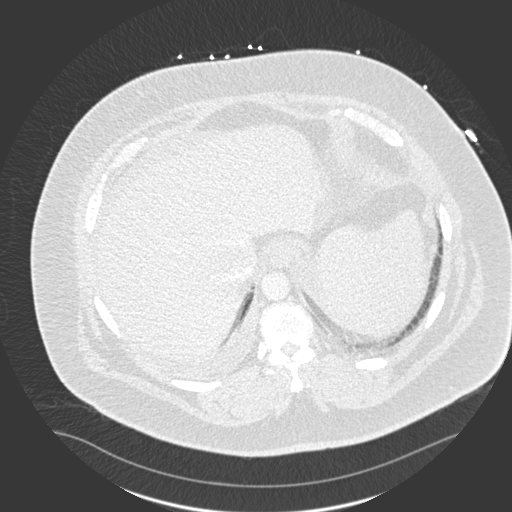
[im 85/324  soft-tissue]
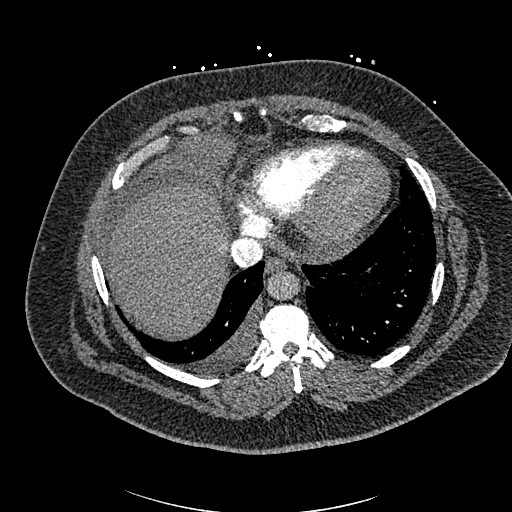
[im 99/324  lung]
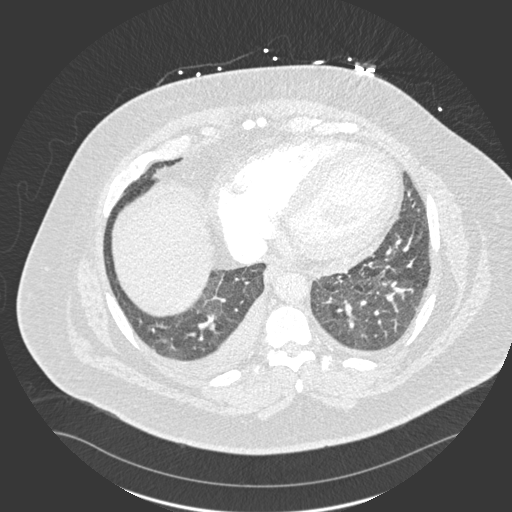
[im 127/324  soft-tissue]
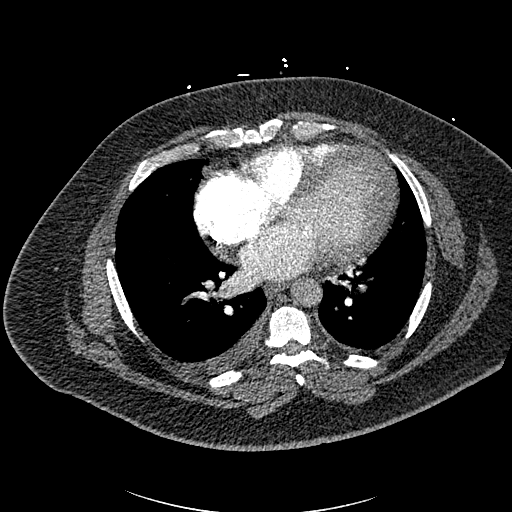
[im 141/324  lung]
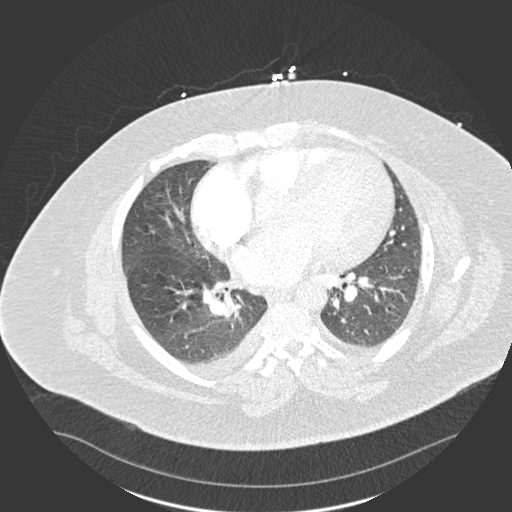
[im 169/324  soft-tissue]
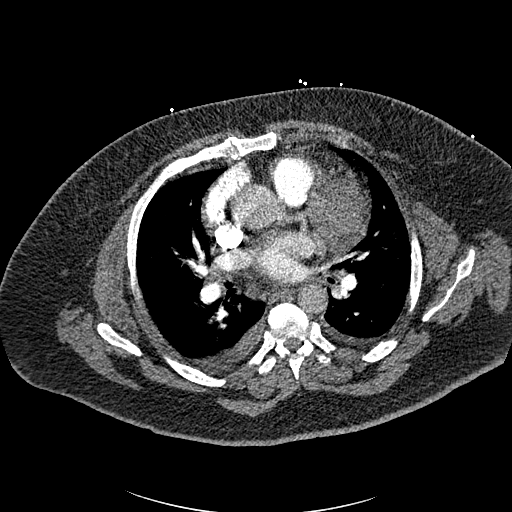
[im 183/324  lung]
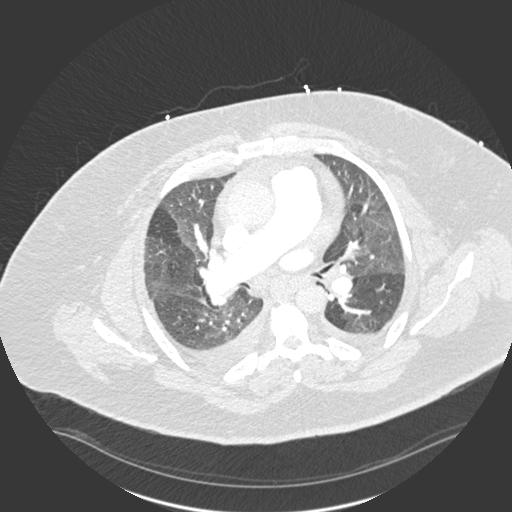
[im 197/324  soft-tissue]
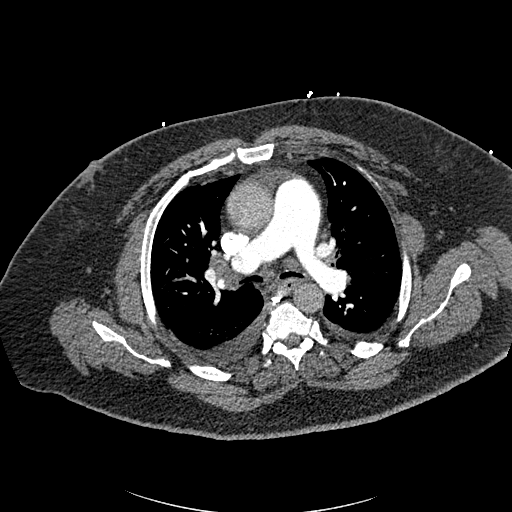
[im 225/324  lung]
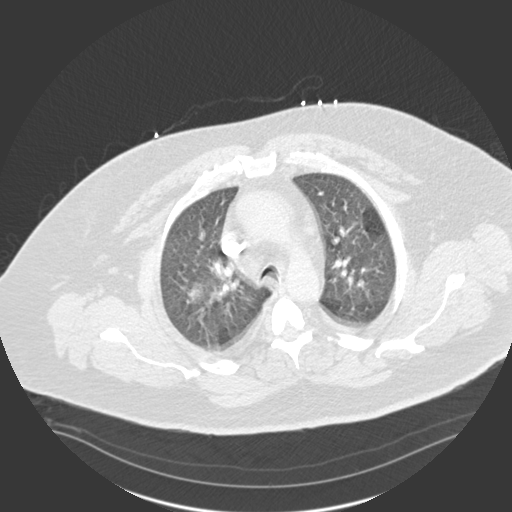
[im 239/324  soft-tissue]
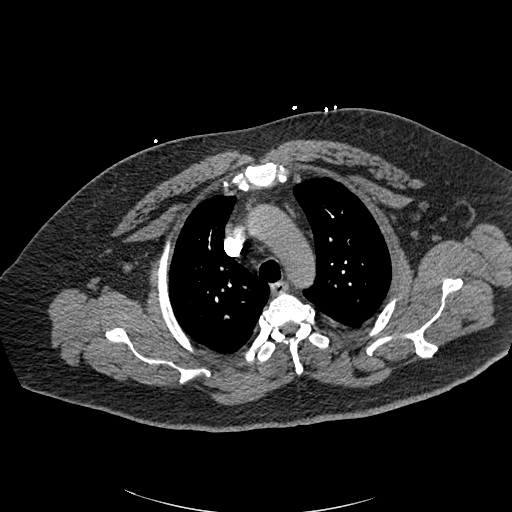
[im 267/324  lung]
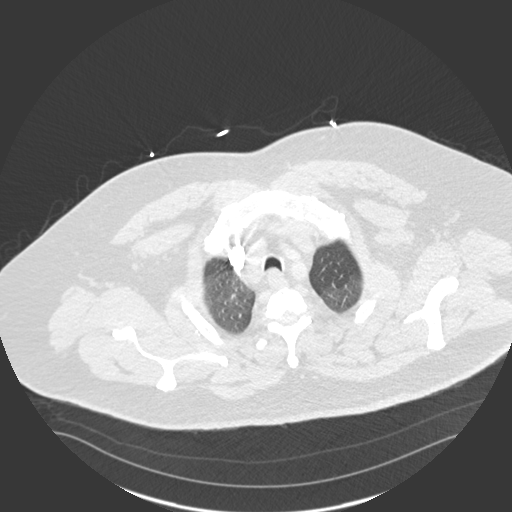
[im 281/324  soft-tissue]
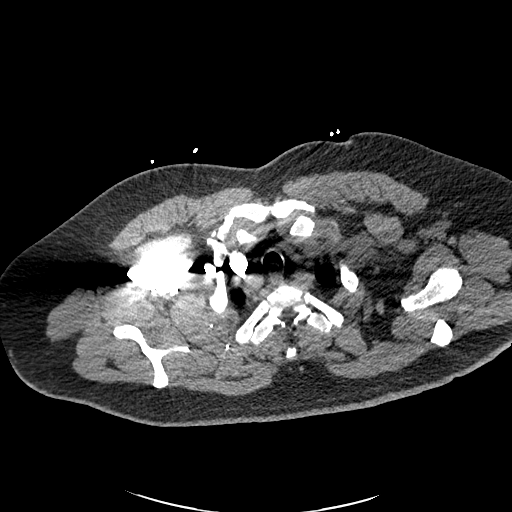
[im 309/324  lung]
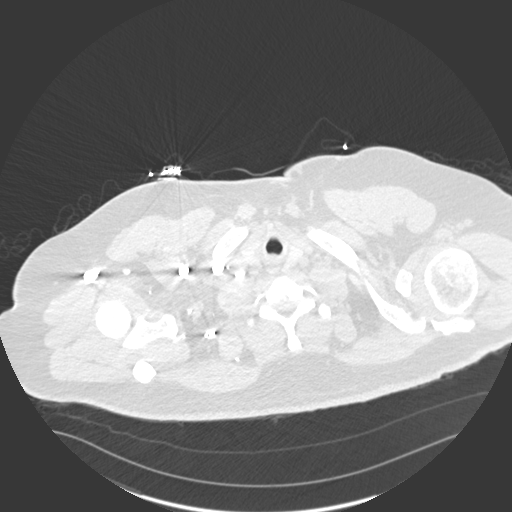

[Series 7: coronal mpr · coronal · 0.70mm/px · 3 of 183 slices shown]
[im 46/183  soft-tissue]
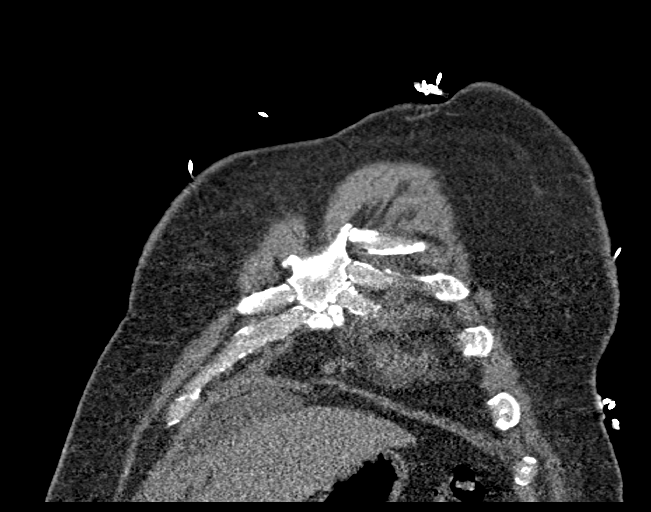
[im 92/183  soft-tissue]
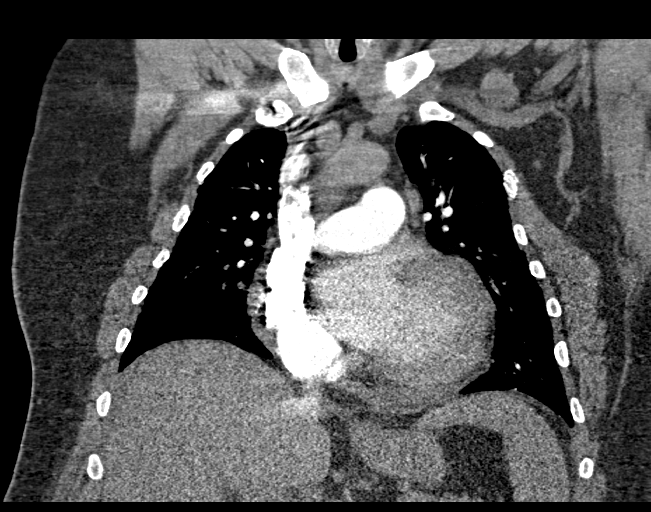
[im 137/183  soft-tissue]
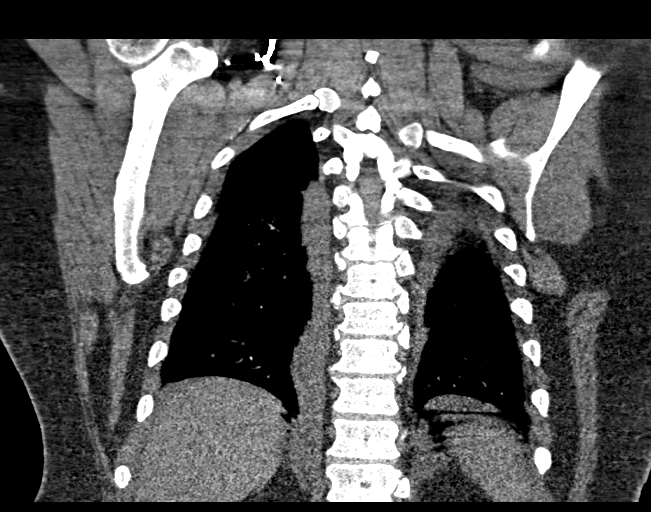

[18 of 46 positions shown; findings below may reference images not displayed]

FINDINGS: Cardiovascular: Breathing motion artifact partially limits
assessment. There are no filling defects within the pulmonary
arteries to suggest pulmonary embolus. Multi chamber cardiomegaly.
Thoracic aorta is normal in caliber. Contrast refluxes into the
hepatic veins and IVC. Minimal pericardial fluid, physiologic versus
small effusion.

Mediastinum/Nodes: Multiple small mediastinal nodes all
subcentimeter. Prominent right hilar nodes measuring 13 mm short
axis. Smaller left hilar nodes. Esophagus is decompressed.

Lungs/Pleura: Small bilateral pleural effusions, right greater than
left. Mild septal thickening at the lung bases. Heterogeneous
ground-glass opacities in the upper lobes, detail obscured by
motion, nonspecific. No confluent consolidation. No pulmonary mass.
Trachea and mainstem bronchi not well evaluated due to breathing
motion expiratory imaging.

Upper Abdomen: Mild contrast refluxing into the hepatic veins and
IVC. Small amount of perihepatic ascites.

Musculoskeletal: There are no acute or suspicious osseous
abnormalities.

Review of the MIP images confirms the above findings.
IMPRESSION: 1. No pulmonary embolus.
2. Findings suggest mild CHF with small bilateral pleural effusions,
mild pulmonary edema and cardiomegaly. Mild contrast refluxing into
the hepatic veins and IVC. Small perihepatic ascites.
3. Heterogeneous attenuation/ground-glass opacities in the upper
lobes, may be pulmonary edema but are nonspecific. Small airways
disease is also considered. Breathing motion limits evaluation.
4. Mild mediastinal and right hilar adenopathy is likely reactive in
the setting CHF

## 2018-04-17 MED FILL — CARVEDILOL 25 MG TABLET: 25 | 30 days supply | Qty: 60 | Fill #3

## 2018-04-17 MED FILL — LOSARTAN POTASSIUM 25 MG TA: 25 | 30 days supply | Qty: 30 | Fill #3

## 2018-04-22 ENCOUNTER — Ambulatory Visit (INDEPENDENT_AMBULATORY_CARE_PROVIDER_SITE_OTHER): Payer: Self-pay | Admitting: Cardiology

## 2018-04-22 ENCOUNTER — Encounter: Payer: Self-pay | Admitting: Cardiology

## 2018-04-22 VITALS — BP 146/86 | HR 91 | Ht 73.0 in | Wt 363.0 lb

## 2018-04-22 DIAGNOSIS — N183 Chronic kidney disease, stage 3 unspecified: Secondary | ICD-10-CM

## 2018-04-22 DIAGNOSIS — Z7901 Long term (current) use of anticoagulants: Secondary | ICD-10-CM

## 2018-04-22 DIAGNOSIS — I10 Essential (primary) hypertension: Secondary | ICD-10-CM

## 2018-04-22 DIAGNOSIS — I481 Persistent atrial fibrillation: Secondary | ICD-10-CM

## 2018-04-22 DIAGNOSIS — I4819 Other persistent atrial fibrillation: Secondary | ICD-10-CM

## 2018-04-22 DIAGNOSIS — I428 Other cardiomyopathies: Secondary | ICD-10-CM

## 2018-04-22 DIAGNOSIS — I4892 Unspecified atrial flutter: Secondary | ICD-10-CM

## 2018-04-22 MED ORDER — RIVAROXABAN 20 MG PO TABS: 20.0000 mg | ORAL_TABLET | Freq: Every day | ORAL | 1 refills | Status: DC

## 2018-04-22 NOTE — Patient Instructions (Signed)
Medication Instructions:  Your physician recommends that you continue on your current medications as directed. Please refer to the Current Medication list given to you today.  Labwork: None   Testing/Procedures: Your physician has requested that you have an echocardiogram. Echocardiography is a painless test that uses sound waves to create images of your heart. It provides your doctor with information about the size and shape of your heart and how well your heart's chambers and valves are working. This procedure takes approximately one hour. There are no restrictions for this procedure. [place]  Follow-Up: Your physician recommends that you schedule a follow-up appointment in: AFTER ECHO WITH DR [facility] ONLY.  Any Other Special Instructions Will Be Listed Below (If Applicable). If you need a refill on your cardiac medications before your next appointment, please call your pharmacy.

## 2018-04-22 NOTE — Progress Notes (Signed)
04/22/2018 Joe Blake   51-01-1966  [id number]  Primary Physician Charlott Rakes, MD Primary Cardiologist: Dr Percival Spanish  HPI:  52 y.o.morbidly obese (BMI 35) malewith a history of HTN and DM admitted 11/19/17 with DOE and was found to have AF with RVR and CHF.HisEFwas 20-25%. Etiology not yet determined, presumably NICM. [facility] [place] [facility] and was told his heart was "pumping at 33%". No history of cath,orMI and he is a poor candidate for Myoviewsecondary to morbid obeisty. He was not aware of previous diagnosis of atrial fibrillation. His CHA2DS-Vasc score is 3 (HTN, DM II, CHF). He was started on Eliquis but Pharmacy has suggested Coumadinsecondary to morbid obesity. He did say he had a sleep study in the past but could not tolerate C-pap. He diuresed from 390 lbs to 378 lbs. He was seen in the office 12/10/17 for follow up. The plan was to control his B/P and consider OP DCCV after 4 weeks of therapeutic INR. Unfortunately we had trouble getting a therapeutic INR x 4 weeks in a row and on 02/25/18, after discussion with our pharmacist in the office, it was decided to place him on Xarelto. The pt underwent OP DCCV 03/18/18. He is in the office today for follow up post cardioversion. He hasn't noted any real change. He still has DOE, no rest symptoms.   Current Outpatient Medications  Medication Sig Dispense Refill  . carvedilol (COREG) 25 MG tablet Take 1 tablet (25 mg total) by mouth 2 (two) times daily with a meal. 180 tablet 3  . colchicine 0.6 MG tablet Take 1 tablet (0.6 mg total) by mouth once as needed (take 1.2mg  and then 0.6mg  1 hour later if still having symptoms). 30 tablet 0  . furosemide (LASIX) 40 MG tablet Take 1 tablet (40 mg total) by mouth daily. 90 tablet 3  . losartan (COZAAR) 25 MG tablet Take 1 tablet (25 mg total) by mouth daily. 90 tablet 3  . rivaroxaban (XARELTO) 20 MG TABS tablet Take 1 tablet (20 mg total) by mouth  daily with supper. 30 tablet 1   No current facility-administered medications for this visit.     Allergies  Allergen Reactions  . Lisinopril Cough    Past Medical History:  Diagnosis Date  . Acute systolic HF (heart failure) ([place])   . Atrial fibrillation ([place])   . Diabetes mellitus without complication ([place])   . Hypertension   . Morbid obesity ([place])     Social History   Socioeconomic History  . Marital status: Single    Spouse name: Not on file  . Number of children: Not on file  . Years of education: Not on file  . Highest education level: Not on file  Occupational History  . Not on file  Social Needs  . Financial resource strain: Somewhat hard  . Food insecurity:    Worry: Patient refused    Inability: Patient refused  . Transportation needs:    Medical: No    Non-medical: No  Tobacco Use  . Smoking status: Never Smoker  . Smokeless tobacco: Never Used  Substance and Sexual Activity  . Alcohol use: No    Frequency: Never  . Drug use: No  . Sexual activity: Yes    Partners: Female  Lifestyle  . Physical activity:    Days per week: Patient refused    Minutes per session: Patient refused  . Stress: Only a little  Relationships  . Social connections:  Talks on phone: More than three times a week    Gets together: Twice a week    Attends religious service: Patient refused    Active member of club or organization: No    Attends meetings of clubs or organizations: Never    Relationship status: Never married  . Intimate partner violence:    Fear of current or ex partner: Patient refused    Emotionally abused: Patient refused    Physically abused: Patient refused    Forced sexual activity: Patient refused  Other Topics Concern  . Not on file  Social History Narrative  . Not on file     Family History  Problem Relation Age of Onset  . Heart attack Paternal Grandfather   . Hyperlipidemia Mother      Review of Systems: General: negative for  chills, fever, night sweats or weight changes.  Cardiovascular: negative for chest pain, dyspnea on exertion, edema, orthopnea, palpitations, paroxysmal nocturnal dyspnea or shortness of breath Dermatological: negative for rash Respiratory: negative for cough or wheezing Urologic: negative for hematuria Abdominal: negative for nausea, vomiting, diarrhea, bright red blood per rectum, melena, or hematemesis Neurologic: negative for visual changes, syncope, or dizziness All other systems reviewed and are otherwise negative except as noted above.    Height 6\' 1"  (1.854 m), weight (!) 363 lb (164.7 kg).  General appearance: alert, cooperative, no distress and morbidly obese Lungs: clear to auscultation bilaterally Heart: irregularly irregular rhythm Extremities: no edema Skin: Skin color, texture, turgor normal. No rashes or lesions Neurologic: Grossly normal  EKG Atrial flutter with variable VR  ASSESSMENT AND PLAN:   Atrial Flutter ([place]) New onset S/P OP DCCV 03/18/18. Rate is controlled  Atrial fibrillation- At least since march 2019  Anticoagulated CHADS VASC=3, unable to get consistent INR with Coumadin, changed to Xarelto   HTN (hypertension) Resume Losartan ASAP  CRI-3- Last SCR 03/13/18 down to 1.26-GFR 66  Obesity, Class III, BMI 40-49.9 (morbid obesity) (HCC) BMI 47.9. Couldn't tolerate C-pap in the past   PLAN  Atrial flutter with variable VR post OP DCCV. It's unclear by his history wether or not this is causing him symptoms. I will check an echo, if his LVF is still significantly depressed I would consider EP evaluation for consideration of ablation, though I'm not optimistic. We actually don't know the duration of his atrial arrhythmia but it has been at least since March 2019. I suspect he has significant untreated sleep apnea as well so it's not clear to me that we could ever get him back in NSR. In addition his care and treatment options are limited due to his  financial situation (he ran out of his Losartan and we are processing paperwork for Xarelto assistance). If his LVF is near normal we could consider medical therapy only. F/U Dr Percival Spanish after is echo.   Kerin Ransom PA-C 04/22/2018 1:42 PM

## 2018-05-20 ENCOUNTER — Encounter: Payer: Self-pay | Admitting: Family Medicine

## 2018-05-20 ENCOUNTER — Ambulatory Visit: Payer: Medicaid Other | Attending: Family Medicine | Admitting: Family Medicine

## 2018-05-20 ENCOUNTER — Other Ambulatory Visit: Payer: Self-pay

## 2018-05-20 VITALS — BP 128/78 | HR 82 | Temp 98.2°F | Resp 20 | Ht 73.0 in | Wt 361.0 lb

## 2018-05-20 DIAGNOSIS — M1A0790 Idiopathic chronic gout, unspecified ankle and foot, without tophus (tophi): Secondary | ICD-10-CM | POA: Diagnosis not present

## 2018-05-20 DIAGNOSIS — Z7901 Long term (current) use of anticoagulants: Secondary | ICD-10-CM | POA: Diagnosis not present

## 2018-05-20 DIAGNOSIS — I509 Heart failure, unspecified: Secondary | ICD-10-CM | POA: Insufficient documentation

## 2018-05-20 DIAGNOSIS — Z79899 Other long term (current) drug therapy: Secondary | ICD-10-CM | POA: Diagnosis not present

## 2018-05-20 DIAGNOSIS — I4892 Unspecified atrial flutter: Secondary | ICD-10-CM | POA: Diagnosis not present

## 2018-05-20 DIAGNOSIS — R7303 Prediabetes: Secondary | ICD-10-CM | POA: Diagnosis not present

## 2018-05-20 DIAGNOSIS — I110 Hypertensive heart disease with heart failure: Secondary | ICD-10-CM | POA: Diagnosis not present

## 2018-05-20 DIAGNOSIS — I481 Persistent atrial fibrillation: Secondary | ICD-10-CM | POA: Diagnosis not present

## 2018-05-20 DIAGNOSIS — Z6841 Body Mass Index (BMI) 40.0 and over, adult: Secondary | ICD-10-CM | POA: Diagnosis not present

## 2018-05-20 DIAGNOSIS — I4819 Other persistent atrial fibrillation: Secondary | ICD-10-CM

## 2018-05-20 DIAGNOSIS — I10 Essential (primary) hypertension: Secondary | ICD-10-CM

## 2018-05-20 DIAGNOSIS — Z1211 Encounter for screening for malignant neoplasm of colon: Secondary | ICD-10-CM | POA: Diagnosis not present

## 2018-05-20 LAB — POCT GLYCOSYLATED HEMOGLOBIN (HGB A1C)
HBA1C, POC (CONTROLLED DIABETIC RANGE): 5.3 % (ref 0.0–7.0)
HbA1c POC (<> result, manual entry): 5.3 % (ref 4.0–5.6)
HbA1c, POC (prediabetic range): 5.3 % — AB (ref 5.7–6.4)
Hemoglobin A1C: 5.3 % (ref 4.0–5.6)

## 2018-05-20 LAB — GLUCOSE, POCT (MANUAL RESULT ENTRY): POC Glucose: 138 mg/dl — AB (ref 70–99)

## 2018-05-20 MED ORDER — COLCHICINE 0.6 MG PO TABS: 0.6000 mg | ORAL_TABLET | Freq: Once | ORAL | 1 refills | Status: DC | PRN

## 2018-05-20 MED ORDER — ALLOPURINOL 300 MG PO TABS
300.0000 mg | ORAL_TABLET | Freq: Every day | ORAL | 6 refills | Status: DC
Start: 2018-05-20 — End: 2018-11-20

## 2018-05-20 MED FILL — !COLCRYS 0.6 MG TABLET: 0.6 MG | 5 days supply | Qty: 10 | Fill #0

## 2018-05-20 MED FILL — LOSARTAN POTASSIUM 25 MG TA: 25 | 30 days supply | Qty: 30 | Fill #4

## 2018-05-20 MED FILL — ALLOPURINOL 300 MG TAB: 300 | 30 days supply | Qty: 30 | Fill #0

## 2018-05-20 NOTE — Progress Notes (Signed)
Subjective:  Patient ID: Joe Blake, male    DOB: 01-Oct-1965  Age: 52 y.o. MRN: [id number]  CC: Follow-up   HPI Joe Blake  is a 52 year old male with history of Prediabetes (A1c 5.3) hypertension, gout, congestive heart failure (EF 20-25%), atrial fibrillation with RVR/atrial flutter status post DCCV on 03/2018 (currently on anticoagulation with Xarelto due to failure to achieve therapeutic INR) here for follow-up visit. He denies a history of diabetes mellitus but endorses being prediabetic. He was last seen by cardiology on 04/22/2018 and echo was scheduled with plans for referral for possible ablation however he is yet to undergo his echocardiogram.  He presents today complaining of gout flare in his left foot which started early hours of this morning and prior to this acute flare he had 2 weeks ago and symptoms have been present for 1 month.  He ran out of his colchicine which usually relieves his flares.  He denies ingestion of beef, seafood or red wine. He took some Aleve which did help with his symptoms and his pain at this time is minimal but he states on bearing weight pain is worse He denies chest pains or shortness of breath and does not exercise much. With regards to healthcare maintenance he is not open to undergoing a colonoscopy.  Past Medical History:  Diagnosis Date  . Acute systolic HF (heart failure) ([place])   . Atrial fibrillation ([place])   . Diabetes mellitus without complication ([place])   . Hypertension   . Morbid obesity ([place])     Past Surgical History:  Procedure Laterality Date  . ABDOMINAL SURGERY    . APPENDECTOMY     at 52 years old  . CARDIOVERSION N/A 03/18/2018   Procedure: CARDIOVERSION;  Surgeon: Skeet Latch, MD;  Location: [facility];  Service: Cardiovascular;  Laterality: N/A;    Allergies  Allergen Reactions  . Lisinopril Cough     Outpatient Medications Prior to Visit  Medication Sig Dispense Refill  . carvedilol (COREG) 25 MG tablet Take 1  tablet (25 mg total) by mouth 2 (two) times daily with a meal. 180 tablet 3  . losartan (COZAAR) 25 MG tablet Take 1 tablet (25 mg total) by mouth daily. 90 tablet 3  . rivaroxaban (XARELTO) 20 MG TABS tablet Take 1 tablet (20 mg total) by mouth daily with supper. 30 tablet 1  . colchicine 0.6 MG tablet Take 1 tablet (0.6 mg total) by mouth once as needed (take 1.2mg  and then 0.6mg  1 hour later if still having symptoms). 30 tablet 0  . furosemide (LASIX) 40 MG tablet Take 1 tablet (40 mg total) by mouth daily. 90 tablet 3   No facility-administered medications prior to visit.     ROS Review of Systems  Constitutional: Negative for activity change and appetite change.  HENT: Negative for sinus pressure and sore throat.   Eyes: Negative for visual disturbance.  Respiratory: Negative for cough, chest tightness and shortness of breath.   Cardiovascular: Negative for chest pain and leg swelling.  Gastrointestinal: Negative for abdominal distention, abdominal pain, constipation and diarrhea.  Endocrine: Negative.   Genitourinary: Negative for dysuria.  Musculoskeletal:       See hpi  Skin: Negative for rash.  Allergic/Immunologic: Negative.   Neurological: Negative for weakness, light-headedness and numbness.  Psychiatric/Behavioral: Negative for dysphoric mood and suicidal ideas.    Objective:  BP 128/78 (BP Location: Left Arm, Cuff Size: Large)   Pulse 82   Temp 98.2 F (36.8 C) (Oral)  Resp 20   Ht 6\' 1"  (1.854 m)   Wt (!) 361 lb (163.7 kg)   SpO2 97%   BMI 47.63 kg/m   BP/Weight 05/20/2018 04/22/2018 3/53/2992  Systolic BP 426 834 196  Diastolic BP 78 86 89  Wt. (Lbs) 361 363 -  BMI 47.63 47.89 -      Physical Exam  Constitutional: He is oriented to person, place, and time. He appears well-developed and well-nourished.  HENT:  Right Ear: External ear normal.  Left Ear: External ear normal.  Mouth/Throat: Oropharynx is clear and moist.  Neck: No JVD present.    Cardiovascular: Normal rate, normal heart sounds and intact distal pulses. An irregularly irregular rhythm present.  No murmur heard. Pulmonary/Chest: Effort normal and breath sounds normal. He has no wheezes. He has no rales. He exhibits no tenderness.  Abdominal: Soft. Bowel sounds are normal. He exhibits no distension and no mass. There is no tenderness.  Musculoskeletal:  Slight edema and erythema of left ankle.  Minimal tenderness on palpation of dorsum of left foot Right foot is normal  Neurological: He is alert and oriented to person, place, and time.  Skin: Skin is warm and dry.  Psychiatric: He has a normal mood and affect.    CMP Latest Ref Rng & Units 03/13/2018 01/09/2018 12/10/2017  Glucose 65 - 99 mg/dL 103(H) 151(H) 102(H)  BUN 6 - 24 mg/dL 20 20 19   Creatinine 0.76 - 1.27 mg/dL 1.26 1.58(H) 1.40(H)  Sodium 134 - 144 mmol/L 144 146(H) 141  Potassium 3.5 - 5.2 mmol/L 4.8 5.1 5.6(H)  Chloride 96 - 106 mmol/L 104 106 104  CO2 20 - 29 mmol/L 25 25 26   Calcium 8.7 - 10.2 mg/dL 9.2 9.2 9.4  Total Protein 6.5 - 8.1 g/dL - - -  Total Bilirubin 0.3 - 1.2 mg/dL - - -  Alkaline Phos 38 - 126 U/L - - -  AST 15 - 41 U/L - - -  ALT 17 - 63 U/L - - -     Lab Results  Component Value Date   HGBA1C 5.3 05/20/2018   HGBA1C 5.3 05/20/2018   HGBA1C 5.3 (A) 05/20/2018   HGBA1C 5.3 05/20/2018     Assessment & Plan:   1. Prediabetes Diet controlled with A1c of 5.3 - Glucose (CBG) - HgB A1c  2. Idiopathic chronic gout of foot without tophus, unspecified laterality Commenced allopurinol for prophylaxis given recurrent flares Discussed low purine eating plan - Uric Acid - colchicine 0.6 MG tablet; Take 1 tablet (0.6 mg total) by mouth once as needed (take 1.2mg  and then 0.6mg  1 hour later if still having symptoms).  Dispense: 30 tablet; Refill: 1  3. Essential hypertension Controlled Counseled on blood pressure goal of less than 130/80, low-sodium, DASH diet, medication  compliance, 150 minutes of moderate intensity exercise per week. Discussed medication compliance, adverse effects.   4. Persistent atrial fibrillation (HCC)/ Atrial flutter Status post DCCV in 03/2018 Continue Xarelto, carvedilol Follow-up with cardiology  5. Screening for colon cancer - Fecal occult blood, imunochemical([facility]); Future - Fecal occult blood, imunochemical([facility])   Meds ordered this encounter  Medications  . allopurinol (ZYLOPRIM) 300 MG tablet    Sig: Take 1 tablet (300 mg total) by mouth daily.    Dispense:  30 tablet    Refill:  6  . colchicine 0.6 MG tablet    Sig: Take 1 tablet (0.6 mg total) by mouth once as needed (take 1.2mg  and then 0.6mg  1 hour later  if still having symptoms).    Dispense:  30 tablet    Refill:  1    Follow-up: Return in about 3 months (around 08/19/2018) for Follow-up of chronic medical conditions.   Charlott Rakes MD

## 2018-05-20 NOTE — Progress Notes (Signed)
Flu shot : no  Pain: left foot, last night, 9 gout  Refill on colchicine

## 2018-05-20 NOTE — Patient Instructions (Signed)

## 2018-05-29 ENCOUNTER — Ambulatory Visit (INDEPENDENT_AMBULATORY_CARE_PROVIDER_SITE_OTHER): Payer: Self-pay | Admitting: Cardiology

## 2018-05-29 ENCOUNTER — Encounter: Payer: Self-pay | Admitting: Cardiology

## 2018-05-29 DIAGNOSIS — I481 Persistent atrial fibrillation: Secondary | ICD-10-CM

## 2018-05-29 DIAGNOSIS — I10 Essential (primary) hypertension: Secondary | ICD-10-CM

## 2018-05-29 DIAGNOSIS — Z7901 Long term (current) use of anticoagulants: Secondary | ICD-10-CM

## 2018-05-29 DIAGNOSIS — I4819 Other persistent atrial fibrillation: Secondary | ICD-10-CM

## 2018-05-29 DIAGNOSIS — N183 Chronic kidney disease, stage 3 unspecified: Secondary | ICD-10-CM

## 2018-05-29 DIAGNOSIS — M722 Plantar fascial fibromatosis: Secondary | ICD-10-CM

## 2018-05-29 NOTE — Progress Notes (Signed)
05/29/2018 Joe Blake   09-04-1966  [id number]  Primary Physician Charlott Rakes, MD Primary Cardiologist: Dr Percival Spanish  HPI:  63B.o.morbidly obese (BMI 93) malewith a history of HTN and DM admitted 11/19/17 with DOE and was found to have AF with RVR and CHF.HisEFwas 20-25%. Etiology not yet determined, presumably NICM. [facility] [place] [facility] and was told his heart was "pumping at 33%". No history of cath,orMI and he is a poor candidate for Myoviewsecondary to morbid obeisty. He was not aware of previous diagnosis of atrial fibrillation. He did say he had a sleep study in the past but could not tolerate C-pap. His CHA2DS-Vasc score is 3 (HTN, DM II, CHF). He was started on Eliquis but Pharmacy has suggested Coumadinsecondary to morbid obesity. We couldn't keep his INR therapeutic and he was changed to Xarelto.  The pt underwent OP DCCV 03/18/18 but failed to hold NSR. It's unclear by his history wether or not this is causing him symptoms. The plan was to obtain a follow up echo and make further recommendations based on his EF. If his LVF was still significantly depressed we could consider EP evaluation for consideration of ablation, though I'm not optimistic. We actually don't know the duration of his atrial arrhythmia but it has been at least since March 2019. I suspect he has significant untreated sleep apnea as well so it's not clear to me that we could ever get him back in NSR. In addition his care and treatment options are limited due to his financial situation, he has intermittent run out of his medications. We have filed for assurance for his Xarelto. He is in the office today for follow up- it wa supposed to be after his echo but this isn't till tomorrow. He complains of pain in his feet, unlike his gout pain, and wondered if it was the Xarelto.     Current Outpatient Medications  Medication Sig Dispense Refill  . allopurinol (ZYLOPRIM)  300 MG tablet Take 1 tablet (300 mg total) by mouth daily. 30 tablet 6  . carvedilol (COREG) 25 MG tablet Take 1 tablet (25 mg total) by mouth 2 (two) times daily with a meal. 180 tablet 3  . colchicine 0.6 MG tablet Take 1 tablet (0.6 mg total) by mouth once as needed (take 1.2mg  and then 0.6mg  1 hour later if still having symptoms). 30 tablet 1  . furosemide (LASIX) 40 MG tablet Take 1 tablet (40 mg total) by mouth daily. 90 tablet 3  . losartan (COZAAR) 25 MG tablet Take 1 tablet (25 mg total) by mouth daily. 90 tablet 3  . rivaroxaban (XARELTO) 20 MG TABS tablet Take 1 tablet (20 mg total) by mouth daily with supper. 30 tablet 1   No current facility-administered medications for this visit.     Allergies  Allergen Reactions  . Lisinopril Cough    Past Medical History:  Diagnosis Date  . Acute systolic HF (heart failure) ([place])   . Atrial fibrillation ([place])   . Diabetes mellitus without complication ([place])   . Hypertension   . Morbid obesity ([place])     Social History   Socioeconomic History  . Marital status: Single    Spouse name: Not on file  . Number of children: Not on file  . Years of education: Not on file  . Highest education level: Not on file  Occupational History  . Not on file  Social Needs  . Financial resource strain: Somewhat hard  .  Food insecurity:    Worry: Patient refused    Inability: Patient refused  . Transportation needs:    Medical: No    Non-medical: No  Tobacco Use  . Smoking status: Never Smoker  . Smokeless tobacco: Never Used  Substance and Sexual Activity  . Alcohol use: No    Frequency: Never  . Drug use: No  . Sexual activity: Yes    Partners: Female  Lifestyle  . Physical activity:    Days per week: Patient refused    Minutes per session: Patient refused  . Stress: Only a little  Relationships  . Social connections:    Talks on phone: More than three times a week    Gets together: Twice a week    Attends religious service:  Patient refused    Active member of club or organization: No    Attends meetings of clubs or organizations: Never    Relationship status: Never married  . Intimate partner violence:    Fear of current or ex partner: Patient refused    Emotionally abused: Patient refused    Physically abused: Patient refused    Forced sexual activity: Patient refused  Other Topics Concern  . Not on file  Social History Narrative  . Not on file     Family History  Problem Relation Age of Onset  . Heart attack Paternal Grandfather   . Hyperlipidemia Mother      Review of Systems: General: negative for chills, fever, night sweats or weight changes.  Cardiovascular: negative for chest pain, dyspnea on exertion, edema, orthopnea, palpitations, paroxysmal nocturnal dyspnea or shortness of breath Dermatological: negative for rash Respiratory: negative for cough or wheezing Urologic: negative for hematuria Abdominal: negative for nausea, vomiting, diarrhea, bright red blood per rectum, melena, or hematemesis Neurologic: negative for visual changes, syncope, or dizziness All other systems reviewed and are otherwise negative except as noted above.    Blood pressure 116/80, pulse 73, height 6\' 1"  (1.854 m), weight (!) 360 lb 6.4 oz (163.5 kg), SpO2 96 %.  General appearance: alert, cooperative, no distress and morbidly obese Lungs: clear to auscultation bilaterally Heart: irregularly irregular rhythm Extremities: no edema, tender plantar surface of both feet Skin: cool and dry Neurologic: Grossly normal   ASSESSMENT AND PLAN:   Persistent atrial fibrillation ([place]) He remains in AF with CVR- not clear this is causing him any symptoms. He is obese and doesn't do much and now he has plantar fascitis so he'll be doing even less.   Anticoagulated CHADS VASC=3, unable to get consistent INR with Coumadin, changed to Xarelto   HTN (hypertension) Resume Losartan ASAP  CRI-3- Last SCR 03/13/18 down  to 1.26-GFR 66  Obesity, Class III, BMI 40-49.9 (morbid obesity) (HCC) BMI 47.9. Couldn't tolerate C-pap in the past  Plantar fascitis  By history and exam- recommended local ice as tolerated.   PLAN  We provided him Xarelto samples. I will contact him after his echo. He has a follow up with Dr Percival Spanish in Oct and will keep that.  Kerin Ransom PA-C 05/29/2018 2:24 PM

## 2018-05-29 NOTE — Assessment & Plan Note (Signed)
He remains in AF with CVR- not clear this is causing him any symptoms. He is obese and doesn't do much and now he has plantar fascitis so he'll be doing even less.

## 2018-05-29 NOTE — Patient Instructions (Signed)
Medication Instructions:  No Changes. If you need a refill on your cardiac medications before your next appointment, please call your pharmacy.  Labwork: None Ordered.  Testing/Procedures: None Ordered.  Follow-Up: Your physician wants you to keep follow up with Dr.Hochrein.   Thank you for choosing [facility]!!

## 2018-05-30 ENCOUNTER — Ambulatory Visit (HOSPITAL_COMMUNITY): Payer: Medicaid Other | Attending: Cardiovascular Disease

## 2018-05-30 ENCOUNTER — Telehealth: Payer: Self-pay

## 2018-05-30 ENCOUNTER — Other Ambulatory Visit: Payer: Self-pay

## 2018-05-30 DIAGNOSIS — N189 Chronic kidney disease, unspecified: Secondary | ICD-10-CM | POA: Diagnosis not present

## 2018-05-30 DIAGNOSIS — I4891 Unspecified atrial fibrillation: Secondary | ICD-10-CM | POA: Diagnosis not present

## 2018-05-30 DIAGNOSIS — I1310 Hypertensive heart and chronic kidney disease without heart failure, with stage 1 through stage 4 chronic kidney disease, or unspecified chronic kidney disease: Secondary | ICD-10-CM | POA: Insufficient documentation

## 2018-05-30 DIAGNOSIS — I428 Other cardiomyopathies: Secondary | ICD-10-CM | POA: Diagnosis not present

## 2018-05-30 MED ORDER — PERFLUTREN LIPID MICROSPHERE
1.0000 mL | INTRAVENOUS | Status: AC | PRN
  Administered 2018-05-30: 2 mL via INTRAVENOUS

## 2018-05-30 NOTE — Telephone Encounter (Signed)
Left message for patient informing him that we received approval from [place] patient assistance program. Called patient to see if he received the insurance card from [place] [employer] [place]. Waiting for patient to contact office.   Called pharmacy-[facility] and they have not filled rx for patient at that pharmacy. Will wait for patient to contact office to clarify.

## 2018-05-30 NOTE — Telephone Encounter (Signed)
Patient stated he has not received anything in the mail stating he has been approved for patient assistance. I advised him that I would contact patient assistance and get back with him. He voiced understanding.

## 2018-05-31 NOTE — Telephone Encounter (Signed)
Spoke with Threasa Beards at [facility], [place]. And she stated the patient was approved and the patient would receive a letter in about 7-10 business days that will have an insurance card that he will use to get Xarelto refills. She gave me the Pharmacy card number: [id number] Group #: [id number] Bin#: [id number].

## 2018-05-31 NOTE — Telephone Encounter (Signed)
Patient notified directly and voiced understanding.  

## 2018-05-31 NOTE — Progress Notes (Signed)
Thanks

## 2018-06-13 NOTE — Progress Notes (Signed)
Cardiology Office Note   Date:  06/14/2018   ID:  Joe Blake, DOB [birth year], MRN [id number]  PCP:  Charlott Rakes, MD  Cardiologist:   Minus Breeding, MD   Chief Complaint  Patient presents with  . Atrial Fibrillation      History of Present Illness: Joe Blake is a 52 y.o. male who presents for follow up of atrial fib with RVR.  He was admitted in March and had an EF of 20 - 25%.  [facility] [place] [facility] and [facility] told his heart was "pumping at 33%".  There was no history of cath,orMI and he is a poor candidate for Myoviewsecondary to morbid obesity .     Since he was last seen he is done relatively well.  Today he is quite aware of his medications.  He denies any cardiovascular symptoms.  He does not notice that he is in flutter.  He does not feel any palpitations.  He has had no new shortness of breath, PND or orthopnea.  He has no chest pressure, neck or arm discomfort.  He has no weight gain or edema.  He is still bothered by pain in his feet that was previously thought to be plantar fasciitis.   Past Medical History:  Diagnosis Date  . Acute systolic HF (heart failure) ([place])   . Atrial fibrillation ([place])   . Diabetes mellitus without complication ([place])   . Hypertension   . Morbid obesity ([place])     Past Surgical History:  Procedure Laterality Date  . ABDOMINAL SURGERY    . APPENDECTOMY     at 52 years old  . CARDIOVERSION N/A 03/18/2018   Procedure: CARDIOVERSION;  Surgeon: Skeet Latch, MD;  Location: [facility] ENDOSCOPY;  Service: Cardiovascular;  Laterality: N/A;     Current Outpatient Medications  Medication Sig Dispense Refill  . allopurinol (ZYLOPRIM) 300 MG tablet Take 1 tablet (300 mg total) by mouth daily. 30 tablet 6  . carvedilol (COREG) 25 MG tablet Take 1 tablet (25 mg total) by mouth 2 (two) times daily with a meal. 180 tablet 3  . colchicine 0.6 MG tablet Take 1 tablet (0.6 mg total) by mouth once as  needed (take 1.2mg  and then 0.6mg  1 hour later if still having symptoms). 30 tablet 1  . furosemide (LASIX) 40 MG tablet Take 1 tablet (40 mg total) by mouth daily. 90 tablet 3  . losartan (COZAAR) 50 MG tablet Take 1 tablet (50 mg total) by mouth daily. 90 tablet 3  . rivaroxaban (XARELTO) 20 MG TABS tablet Take 1 tablet (20 mg total) by mouth daily with supper. 30 tablet 11   No current facility-administered medications for this visit.     Allergies:   Lisinopril    ROS:  Please see the history of present illness.   Otherwise, review of systems are positive for none.   All other systems are reviewed and negative.    PHYSICAL EXAM: VS:  BP 136/66   Pulse 74   Ht 6\' 1"  (1.854 m)   Wt (!) 361 lb (163.7 kg)   SpO2 97%   BMI 47.63 kg/m  , BMI Body mass index is 47.63 kg/m. GENERAL:  Well appearing NECK:  No jugular venous distention, waveform within normal limits, carotid upstroke brisk and symmetric, no bruits, no thyromegaly LUNGS:  Clear to auscultation bilaterally CHEST:  Unremarkable HEART:  PMI not displaced or sustained,S1 and S2 within normal limits, no S3, no clicks,  no rubs, no murmurs, irregular  ABD:  Flat, positive bowel sounds normal in frequency in pitch, no bruits, no rebound, no guarding, no midline pulsatile mass, no hepatomegaly, no splenomegaly EXT:  2 plus pulses throughout, no edema, no cyanosis no clubbing    EKG:  EKG is not ordered today. The ekg ordered today demonstrates NA   Recent Labs: 11/19/2017: ALT <5; B Natriuretic Peptide 248.5 11/22/2017: TSH 4.111 11/25/2017: Magnesium 2.3 03/13/2018: BUN 20; Creatinine, Ser 1.26; Hemoglobin 13.9; Platelets 205; Potassium 4.8; Sodium 144    Lipid Panel No results found for: CHOL, TRIG, HDL, CHOLHDL, VLDL, LDLCALC, LDLDIRECT    Wt Readings from Last 3 Encounters:  06/14/18 (!) 361 lb (163.7 kg)  05/29/18 (!) 360 lb 6.4 oz (163.5 kg)  05/20/18 (!) 361 lb (163.7 kg)      Other studies  Reviewed: Additional studies/ records that were reviewed today include: Labs. Review of the above records demonstrates:  Please see elsewhere in the note.     ASSESSMENT AND PLAN:  Persistent atrial fibrillation [facility]) Mr. [name] has a CHA2DS2 - VASc score of 3.   He tolerates anticoagulation and is at reasonable rate control.  He does not feel the fibrillation.  No change in therapy.  HTN (hypertension) This is being managed in the context of treating his CHF  CRI-3- Creat was as above.  No change in therapy.    Obesity, Class III, BMI 40-49.9 (morbid obesity) ([place]) He has been educated.   Sleep Apnea He agrees to get retested to consider therapies as I do think he has sleep apnea and is important to quantify how severe.  He did not tolerate CPAP in the past but he might try it again or perhaps an appliance or surgery.  Chronic Systolic HF He seems to be euvolemic.  I am can increase his Cozaar to 50 mg daily.   Current medicines are reviewed at length with the patient today.  The patient does not have concerns regarding medicines.  The following changes have been made:  As above  Labs/ tests ordered today include:   Orders Placed This Encounter  Procedures  . Split night study     Disposition:   FU with Kerin Ransom PAC in two months.     Signed, Minus Breeding, MD  06/14/2018 3:00 PM    [place]

## 2018-06-14 ENCOUNTER — Ambulatory Visit (INDEPENDENT_AMBULATORY_CARE_PROVIDER_SITE_OTHER): Payer: Self-pay | Admitting: Cardiology

## 2018-06-14 ENCOUNTER — Encounter: Payer: Self-pay | Admitting: Cardiology

## 2018-06-14 VITALS — BP 136/66 | HR 74 | Ht 73.0 in | Wt 361.0 lb

## 2018-06-14 DIAGNOSIS — I5022 Chronic systolic (congestive) heart failure: Secondary | ICD-10-CM

## 2018-06-14 DIAGNOSIS — I10 Essential (primary) hypertension: Secondary | ICD-10-CM

## 2018-06-14 DIAGNOSIS — I4892 Unspecified atrial flutter: Secondary | ICD-10-CM

## 2018-06-14 DIAGNOSIS — G4730 Sleep apnea, unspecified: Secondary | ICD-10-CM | POA: Insufficient documentation

## 2018-06-14 MED ORDER — RIVAROXABAN 20 MG PO TABS: 20.0000 mg | ORAL_TABLET | Freq: Every day | ORAL | 11 refills | Status: DC

## 2018-06-14 MED ORDER — LOSARTAN POTASSIUM 50 MG PO TABS: 50.0000 mg | ORAL_TABLET | Freq: Every day | ORAL | 3 refills | Status: DC

## 2018-06-14 MED FILL — LOSARTAN POTASSIUM 50 MG TA: 50 | 30 days supply | Qty: 30 | Fill #0

## 2018-06-14 NOTE — Patient Instructions (Signed)
Medication Instructions:  INCREASE- Losartan 50 mg daily  If you need a refill on your cardiac medications before your next appointment, please call your pharmacy.  Labwork: None Ordered   If you have labs (blood work) drawn today and your tests are completely normal, you will receive your results only by: Marland Kitchen MyChart Message (if you have MyChart) OR . A paper copy in the mail If you have any lab test that is abnormal or we need to change your treatment, we will call you to review the results.  Testing/Procedures: Your physician has recommended that you have a sleep study. This test records several body functions during sleep, including: brain activity, eye movement, oxygen and carbon dioxide blood levels, heart rate and rhythm, breathing rate and rhythm, the flow of air through your mouth and nose, snoring, body muscle movements, and chest and belly movement.   Follow-Up: . You will need a follow up appointment in 2 Months with Kerin Ransom.     At [facility], you and your health needs are our priority.  As part of our continuing mission to provide you with exceptional heart care, we have created designated Provider Care Teams.  These Care Teams include your primary Cardiologist (physician) and Advanced Practice Providers (APPs -  Physician Assistants and Nurse Practitioners) who all work together to provide you with the care you need, when you need it.    Thank you for choosing [facility]!!

## 2018-06-18 ENCOUNTER — Telehealth: Payer: Self-pay | Admitting: *Deleted

## 2018-06-18 ENCOUNTER — Other Ambulatory Visit: Payer: Self-pay | Admitting: Cardiology

## 2018-06-18 DIAGNOSIS — G4733 Obstructive sleep apnea (adult) (pediatric): Secondary | ICD-10-CM

## 2018-06-18 DIAGNOSIS — I10 Essential (primary) hypertension: Secondary | ICD-10-CM

## 2018-06-18 NOTE — Telephone Encounter (Signed)
Patient notified HST scheduled for 07/05/18.

## 2018-06-18 NOTE — Telephone Encounter (Signed)
-----   Message from Vennie Homans sent at 06/18/2018  2:07 PM EDT ----- Regarding: FW: Sleep Study   ----- Message ----- From: Roland Earl Sent: 06/14/2018   2:54 PM EDT To: Vennie Homans Subject: Sleep Study                                    Good afternoon, Joe Blake is self pay----does Dr. Percival Spanish want to order a Home Sleep Study ( lab study is $3,500 and a Home study is around $800.00)

## 2018-06-19 MED FILL — ALLOPURINOL 300 MG TAB: 300 | 30 days supply | Qty: 30 | Fill #1

## 2018-06-19 MED FILL — CARVEDILOL 25 MG TABLET: 25 | 30 days supply | Qty: 60 | Fill #4

## 2018-06-19 MED FILL — !COLCRYS 0.6 MG TABLET: 0.6 MG | 5 days supply | Qty: 10 | Fill #1

## 2018-07-05 ENCOUNTER — Encounter (HOSPITAL_BASED_OUTPATIENT_CLINIC_OR_DEPARTMENT_OTHER): Payer: Self-pay

## 2018-07-19 MED FILL — !COLCRYS 0.6 MG TABLET: 0.6 MG | 5 days supply | Qty: 10 | Fill #2

## 2018-07-19 MED FILL — LOSARTAN POTASSIUM 50 MG TA: 50 | 30 days supply | Qty: 30 | Fill #1

## 2018-07-19 MED FILL — ALLOPURINOL 300 MG TAB: 300 | 30 days supply | Qty: 30 | Fill #2

## 2018-07-19 MED FILL — CARVEDILOL 25 MG TABLET: 25 | 30 days supply | Qty: 60 | Fill #5

## 2018-07-24 ENCOUNTER — Encounter (HOSPITAL_BASED_OUTPATIENT_CLINIC_OR_DEPARTMENT_OTHER): Payer: Self-pay

## 2018-07-26 ENCOUNTER — Ambulatory Visit (HOSPITAL_BASED_OUTPATIENT_CLINIC_OR_DEPARTMENT_OTHER): Payer: Medicaid Other | Attending: Cardiology | Admitting: Cardiovascular Disease

## 2018-07-26 DIAGNOSIS — Z6841 Body Mass Index (BMI) 40.0 and over, adult: Secondary | ICD-10-CM | POA: Insufficient documentation

## 2018-07-26 DIAGNOSIS — R0902 Hypoxemia: Secondary | ICD-10-CM | POA: Diagnosis not present

## 2018-07-26 DIAGNOSIS — I10 Essential (primary) hypertension: Secondary | ICD-10-CM | POA: Diagnosis not present

## 2018-07-26 DIAGNOSIS — G4733 Obstructive sleep apnea (adult) (pediatric): Secondary | ICD-10-CM | POA: Diagnosis not present

## 2018-08-09 ENCOUNTER — Encounter (HOSPITAL_BASED_OUTPATIENT_CLINIC_OR_DEPARTMENT_OTHER): Payer: Self-pay | Admitting: Cardiovascular Disease

## 2018-08-09 NOTE — Procedures (Signed)
    Patient Name: Joe Blake, Joe Blake Date: 07/28/2018 Gender: Male D.O.B: 02-Jun-1966 Age (years): 52 Referring Provider: Minus Breeding Height (inches): 29 Interpreting Physician: Shelva Majestic MD, ABSM Weight (lbs): 360 RPSGT: Jacolyn Reedy BMI: 50 MRN: [id number] Neck Size: 18.50  CLINICAL INFORMATION Sleep Study Type: HST  Indication for sleep study: snoring, OSA  Epworth Sleepiness Score: 5  SLEEP STUDY TECHNIQUE A multi-channel overnight portable sleep study was performed. The channels recorded were: nasal airflow, thoracic respiratory movement, and oxygen saturation with a pulse oximetry. Snoring was also monitored.  MEDICATIONS     allopurinol (ZYLOPRIM) 300 MG tablet             carvedilol (COREG) 25 MG tablet         colchicine 0.6 MG tablet         furosemide (LASIX) 40 MG tablet         losartan (COZAAR) 50 MG tablet         rivaroxaban (XARELTO) 20 MG TABS tablet      Patient self administered medications include: N/A.  SLEEP ARCHITECTURE Patient was studied for 357.3 minutes. The sleep efficiency was 100.0 % and the patient was supine for 69.5%. The arousal index was 0.0 per hour.  RESPIRATORY PARAMETERS The overall AHI was 53.2 per hour, with a central apnea index of 0.0 per hour.  The oxygen nadir was 60% during sleep.  CARDIAC DATA Mean heart rate during sleep was 82.6 bpm.  IMPRESSIONS - Severe obstructive sleep apnea occurred during this study (AHI = 53.2/h). - No significant central sleep apnea occurred during this study (CAI = 0.0/h). - Severe oxygen desaturation to a nadir of  60%. Time spent < 89% was 133.1 minutes. - Patient snored 34.3% during the sleep.  DIAGNOSIS - Obstructive Sleep Apnea (327.23 [G47.33 ICD-10]) - Nocturnal Hypoxemia (327.26 [G47.36 ICD-10])  RECOMMENDATIONS - In this patient with significant cardiovascular comorbidities including CHF,  atrial fibrillation and severe OSA with significant oxygen desaturation to  60%, recommend an in-lab CPAP titration study.  - Efforts should be made to optimize nasal and oropharyngeal patency. - Positional therapy avoiding supine position during sleep. - Avoid alcohol, sedatives and other CNS depressants that may worsen sleep apnea and disrupt normal sleep architecture. - Sleep hygiene should be reviewed to assess factors that may improve sleep quality. - Weight management (BMI 50) and regular exercise should be initiated or continued.   [Electronically signed] 08/09/2018 09:41 AM  Shelva Majestic MD, [facility], [place], American Board of Sleep Medicine   NPI: [id number] [place] PH: [phone]   FX: [phone] [place]

## 2018-08-10 ENCOUNTER — Encounter (HOSPITAL_COMMUNITY): Payer: Self-pay | Admitting: Family Medicine

## 2018-08-10 ENCOUNTER — Inpatient Hospital Stay (HOSPITAL_COMMUNITY)
Admission: EM | Admit: 2018-08-10 | Discharge: 2018-08-15 | DRG: 377 | Disposition: A | Discharge status: Home / Self Care | Payer: Medicaid Other | Attending: Infectious Disease | Admitting: Infectious Disease

## 2018-08-10 DIAGNOSIS — K259 Gastric ulcer, unspecified as acute or chronic, without hemorrhage or perforation: Secondary | ICD-10-CM | POA: Diagnosis not present

## 2018-08-10 DIAGNOSIS — I959 Hypotension, unspecified: Secondary | ICD-10-CM | POA: Diagnosis not present

## 2018-08-10 DIAGNOSIS — I4892 Unspecified atrial flutter: Secondary | ICD-10-CM | POA: Diagnosis present

## 2018-08-10 DIAGNOSIS — E861 Hypovolemia: Secondary | ICD-10-CM | POA: Diagnosis present

## 2018-08-10 DIAGNOSIS — G4733 Obstructive sleep apnea (adult) (pediatric): Secondary | ICD-10-CM | POA: Diagnosis present

## 2018-08-10 DIAGNOSIS — K922 Gastrointestinal hemorrhage, unspecified: Secondary | ICD-10-CM

## 2018-08-10 DIAGNOSIS — K449 Diaphragmatic hernia without obstruction or gangrene: Secondary | ICD-10-CM | POA: Diagnosis present

## 2018-08-10 DIAGNOSIS — E66813 Obesity, class 3: Secondary | ICD-10-CM | POA: Diagnosis present

## 2018-08-10 DIAGNOSIS — R578 Other shock: Secondary | ICD-10-CM | POA: Diagnosis not present

## 2018-08-10 DIAGNOSIS — I5022 Chronic systolic (congestive) heart failure: Secondary | ICD-10-CM | POA: Diagnosis not present

## 2018-08-10 DIAGNOSIS — I130 Hypertensive heart and chronic kidney disease with heart failure and stage 1 through stage 4 chronic kidney disease, or unspecified chronic kidney disease: Secondary | ICD-10-CM | POA: Diagnosis not present

## 2018-08-10 DIAGNOSIS — I10 Essential (primary) hypertension: Secondary | ICD-10-CM

## 2018-08-10 DIAGNOSIS — Z8249 Family history of ischemic heart disease and other diseases of the circulatory system: Secondary | ICD-10-CM | POA: Diagnosis not present

## 2018-08-10 DIAGNOSIS — D62 Acute posthemorrhagic anemia: Secondary | ICD-10-CM | POA: Diagnosis present

## 2018-08-10 DIAGNOSIS — K254 Chronic or unspecified gastric ulcer with hemorrhage: Principal | ICD-10-CM | POA: Diagnosis present

## 2018-08-10 DIAGNOSIS — Z6841 Body Mass Index (BMI) 40.0 and over, adult: Secondary | ICD-10-CM | POA: Diagnosis not present

## 2018-08-10 DIAGNOSIS — E119 Type 2 diabetes mellitus without complications: Secondary | ICD-10-CM

## 2018-08-10 DIAGNOSIS — K920 Hematemesis: Secondary | ICD-10-CM | POA: Diagnosis present

## 2018-08-10 DIAGNOSIS — Z79899 Other long term (current) drug therapy: Secondary | ICD-10-CM

## 2018-08-10 DIAGNOSIS — K2970 Gastritis, unspecified, without bleeding: Secondary | ICD-10-CM | POA: Diagnosis present

## 2018-08-10 DIAGNOSIS — E1122 Type 2 diabetes mellitus with diabetic chronic kidney disease: Secondary | ICD-10-CM | POA: Diagnosis present

## 2018-08-10 DIAGNOSIS — K2210 Ulcer of esophagus without bleeding: Secondary | ICD-10-CM | POA: Diagnosis not present

## 2018-08-10 DIAGNOSIS — K921 Melena: Secondary | ICD-10-CM | POA: Diagnosis not present

## 2018-08-10 DIAGNOSIS — N183 Chronic kidney disease, stage 3 unspecified: Secondary | ICD-10-CM | POA: Diagnosis present

## 2018-08-10 DIAGNOSIS — I4891 Unspecified atrial fibrillation: Secondary | ICD-10-CM | POA: Diagnosis present

## 2018-08-10 DIAGNOSIS — Z7901 Long term (current) use of anticoagulants: Secondary | ICD-10-CM

## 2018-08-10 DIAGNOSIS — M1A0790 Idiopathic chronic gout, unspecified ankle and foot, without tophus (tophi): Secondary | ICD-10-CM

## 2018-08-10 DIAGNOSIS — M109 Gout, unspecified: Secondary | ICD-10-CM | POA: Diagnosis present

## 2018-08-10 LAB — POC OCCULT BLOOD, ED: Fecal Occult Bld: POSITIVE — AB

## 2018-08-10 LAB — HEMOGLOBIN AND HEMATOCRIT, BLOOD
HEMATOCRIT: 30.1 % — AB (ref 39.0–52.0)
Hemoglobin: 9.3 g/dL — ABNORMAL LOW (ref 13.0–17.0)

## 2018-08-10 LAB — CBC WITH DIFFERENTIAL/PLATELET
Abs Immature Granulocytes: 0.13 10*3/uL — ABNORMAL HIGH (ref 0.00–0.07)
Basophils Absolute: 0.1 10*3/uL (ref 0.0–0.1)
Basophils Relative: 1 %
Eosinophils Absolute: 0.2 10*3/uL (ref 0.0–0.5)
Eosinophils Relative: 1 %
HCT: 29.4 % — ABNORMAL LOW (ref 39.0–52.0)
Hemoglobin: 9 g/dL — ABNORMAL LOW (ref 13.0–17.0)
IMMATURE GRANULOCYTES: 1 %
Lymphocytes Relative: 27 %
Lymphs Abs: 3.8 10*3/uL (ref 0.7–4.0)
MCH: 29.1 pg (ref 26.0–34.0)
MCHC: 30.6 g/dL (ref 30.0–36.0)
MCV: 95.1 fL (ref 80.0–100.0)
Monocytes Absolute: 0.9 10*3/uL (ref 0.1–1.0)
Monocytes Relative: 6 %
Neutro Abs: 9.1 10*3/uL — ABNORMAL HIGH (ref 1.7–7.7)
Neutrophils Relative %: 64 %
Platelets: 391 10*3/uL (ref 150–400)
RBC: 3.09 MIL/uL — ABNORMAL LOW (ref 4.22–5.81)
RDW: 15.1 % (ref 11.5–15.5)
WBC: 14.2 10*3/uL — ABNORMAL HIGH (ref 4.0–10.5)
nRBC: 0 % (ref 0.0–0.2)

## 2018-08-10 LAB — COMPREHENSIVE METABOLIC PANEL
ALBUMIN: 3.6 g/dL (ref 3.5–5.0)
ALT: 14 U/L (ref 0–44)
AST: 20 U/L (ref 15–41)
Alkaline Phosphatase: 54 U/L (ref 38–126)
Anion gap: 11 (ref 5–15)
BUN: 53 mg/dL — ABNORMAL HIGH (ref 6–20)
CO2: 23 mmol/L (ref 22–32)
Calcium: 8.9 mg/dL (ref 8.9–10.3)
Chloride: 108 mmol/L (ref 98–111)
Creatinine, Ser: 1.31 mg/dL — ABNORMAL HIGH (ref 0.61–1.24)
GFR calc Af Amer: >60 mL/min (ref >60)
GFR calc non Af Amer: >60 mL/min (ref >60)
GLUCOSE: 150 mg/dL — AB (ref 70–99)
Potassium: 3.8 mmol/L (ref 3.5–5.1)
Sodium: 142 mmol/L (ref 135–145)
Total Bilirubin: 1 mg/dL (ref 0.3–1.2)
Total Protein: 6.7 g/dL (ref 6.5–8.1)

## 2018-08-10 LAB — ABO/RH: ABO/RH(D): A POS

## 2018-08-10 LAB — PREPARE RBC (CROSSMATCH)

## 2018-08-10 MED ORDER — ACETAMINOPHEN 650 MG RE SUPP: 650.0000 mg | Freq: Four times a day (QID) | RECTAL | Status: DC | PRN

## 2018-08-10 MED ORDER — ALLOPURINOL 300 MG PO TABS
300.0000 mg | ORAL_TABLET | Freq: Every day | ORAL | Status: DC
  Administered 2018-08-11 – 2018-08-15 (×5): 300 mg via ORAL
  Filled 2018-08-10 (×4): qty 1
  Filled 2018-08-10: qty 3

## 2018-08-10 MED ORDER — ACETAMINOPHEN 325 MG PO TABS: 650.0000 mg | ORAL_TABLET | Freq: Four times a day (QID) | ORAL | Status: DC | PRN

## 2018-08-10 MED ORDER — SODIUM CHLORIDE 0.9 % IV SOLN: 10.0000 mL/h | Freq: Once | INTRAVENOUS | Status: DC

## 2018-08-10 MED ORDER — PANTOPRAZOLE SODIUM 40 MG IV SOLR
40.0000 mg | Freq: Once | INTRAVENOUS | Status: AC
  Administered 2018-08-10: 40 mg via INTRAVENOUS
  Filled 2018-08-10: qty 40

## 2018-08-10 MED ORDER — SODIUM CHLORIDE 0.9 % IV SOLN: INTRAVENOUS | Status: DC

## 2018-08-10 MED ORDER — PANTOPRAZOLE SODIUM 40 MG IV SOLR
40.0000 mg | Freq: Two times a day (BID) | INTRAVENOUS | Status: DC
  Administered 2018-08-10 – 2018-08-11 (×2): 40 mg via INTRAVENOUS
  Filled 2018-08-10 (×2): qty 40

## 2018-08-10 MED ORDER — SODIUM CHLORIDE 0.9 % IV BOLUS
1000.0000 mL | Freq: Once | INTRAVENOUS | Status: AC
  Administered 2018-08-10: 1000 mL via INTRAVENOUS

## 2018-08-10 MED ORDER — POLYETHYLENE GLYCOL 3350 17 G PO PACK: 17.0000 g | PACK | Freq: Every day | ORAL | Status: DC | PRN

## 2018-08-10 MED ORDER — CARVEDILOL 12.5 MG PO TABS: 25.0000 mg | ORAL_TABLET | Freq: Two times a day (BID) | ORAL | Status: DC

## 2018-08-10 MED ORDER — METOPROLOL TARTRATE 5 MG/5ML IV SOLN
5.0000 mg | Freq: Once | INTRAVENOUS | Status: AC
  Administered 2018-08-10: 5 mg via INTRAVENOUS
  Filled 2018-08-10: qty 5

## 2018-08-10 NOTE — H&P (View-Only) (Signed)
[place] Reason for consult: Hematemesis. Referring Physician: ER.  PCP Dr.Newlin.  Primary GI: None patient unassigned  Joe Blake is an 52 y.o. male.  HPI: He has morbid obesity and atrial fib with a known RVR.  According to him he has been shocked in the past.  He follows up now with Dr. Orie Fisherman is on Xarelto.  It is felt that he likely has sleep apnea as well.  In the notes there is indication that he has an ejection fraction in the past of 20 to 25% but apparently lately it is felt that he is doing better. He denies any prior history of ulcer disease prior colonoscopy etc.  He denies chronic reflux although he does have this occasionally and will occasionally take Rolaids this is not a very common thing.  Wednesday evening he awoke during the night after taking his regular medicines with vomiting a large amount of coffee-ground material and then had a large quantity of blackish diarrhea.  He had several more episodes of vomiting throughout the day began to feel dizzy but did not have any further bowel movements.  He felt so weak and nearly passed out and his cousin convinced him to come to the emergency room.  Here in the emergency room his pulse is been 1 40-1 50 he had melena on exam and stool was positive for FOB.  He has been given fluids and IV Protonix.  Is also been given metoprolol due to his rapid heart rate.  The question was asked several times and he indicates that his last dose of Xarelto was Wednesday.  He had taken Aleve a week ago for some arthritic type pain and absolutely denies taking any further NSAIDs.  He is a nondrinker and is never had liver disease.  Past Medical History:  Diagnosis Date  . Acute systolic HF (heart failure) ([place])   . Atrial fibrillation ([place])   . Diabetes mellitus without complication ([place])   . Hypertension   . Morbid obesity ([place])     Past Surgical History:  Procedure Laterality Date  . ABDOMINAL SURGERY    . APPENDECTOMY      at 52 years old  . CARDIOVERSION N/A 03/18/2018   Procedure: CARDIOVERSION;  Surgeon: Skeet Latch, MD;  Location[facility] ENDOSCOPY;  Service: Cardiovascular;  Laterality: N/A;    Family History  Problem Relation Age of Onset  . Heart attack Paternal Grandfather   . Hyperlipidemia Mother   . Hypertension Mother     Social History:  reports that he has never smoked. He has never used smokeless tobacco. He reports that he does not drink alcohol or use drugs.  Allergies: No Known Allergies  Medications; Prior to Admission medications   Medication Sig Start Date End Date Taking? Authorizing Provider  allopurinol (ZYLOPRIM) 300 MG tablet Take 1 tablet (300 mg total) by mouth daily. 05/20/18  Yes Charlott Rakes, MD  colchicine 0.6 MG tablet Take 1 tablet (0.6 mg total) by mouth once as needed (take 1.2mg  and then 0.6mg  1 hour later if still having symptoms). 05/20/18  Yes Charlott Rakes, MD  furosemide (LASIX) 40 MG tablet Take 1 tablet (40 mg total) by mouth daily. 01/02/18  Yes Kilroy, Luke K, PA-C  losartan (COZAAR) 50 MG tablet Take 1 tablet (50 mg total) by mouth daily. 06/14/18 06/09/19 Yes Minus Breeding, MD  naproxen sodium (ALEVE) 220 MG tablet Take 440 mg by mouth daily as needed (pain).   Yes [provider]  carvedilol (  COREG) 25 MG tablet Take 1 tablet (25 mg total) by mouth 2 (two) times daily with a meal. 01/02/18 12/28/18  Kilroy, Doreene Burke, PA-C  rivaroxaban (XARELTO) 20 MG TABS tablet Take 1 tablet (20 mg total) by mouth daily with supper. 06/14/18   Minus Breeding, MD   . Derrill Memo ON 08/11/2018] allopurinol  300 mg Oral Daily  . [START ON 08/11/2018] carvedilol  25 mg Oral BID WC  . pantoprazole  40 mg Intravenous Q12H   PRN Meds acetaminophen **OR** acetaminophen, polyethylene glycol Results for orders placed or performed during the hospital encounter of 08/10/18 (from the past 48 hour(s))  CBC with Differential/Platelet     Status: Abnormal   Collection Time:  08/10/18  2:53 PM  Result Value Ref Range   WBC 14.2 (H) 4.0 - 10.5 K/uL   RBC 3.09 (L) 4.22 - 5.81 MIL/uL   Hemoglobin 9.0 (L) 13.0 - 17.0 g/dL   HCT 29.4 (L) 39.0 - 52.0 %   MCV 95.1 80.0 - 100.0 fL   MCH 29.1 26.0 - 34.0 pg   MCHC 30.6 30.0 - 36.0 g/dL   RDW 15.1 11.5 - 15.5 %   Platelets 391 150 - 400 K/uL   nRBC 0.0 0.0 - 0.2 %   Neutrophils Relative % 64 %   Neutro Abs 9.1 (H) 1.7 - 7.7 K/uL   Lymphocytes Relative 27 %   Lymphs Abs 3.8 0.7 - 4.0 K/uL   Monocytes Relative 6 %   Monocytes Absolute 0.9 0.1 - 1.0 K/uL   Eosinophils Relative 1 %   Eosinophils Absolute 0.2 0.0 - 0.5 K/uL   Basophils Relative 1 %   Basophils Absolute 0.1 0.0 - 0.1 K/uL   Immature Granulocytes 1 %   Abs Immature Granulocytes 0.13 (H) 0.00 - 0.07 K/uL    Comment: Performed at [facility], [place] [address] [place], [place] [address]  Comprehensive metabolic panel     Status: Abnormal   Collection Time: 08/10/18  2:53 PM  Result Value Ref Range   Sodium 142 135 - 145 mmol/L   Potassium 3.8 3.5 - 5.1 mmol/L   Chloride 108 98 - 111 mmol/L   CO2 23 22 - 32 mmol/L   Glucose, Bld 150 (H) 70 - 99 mg/dL   BUN 53 (H) 6 - 20 mg/dL   Creatinine, Ser 1.31 (H) 0.61 - 1.24 mg/dL   Calcium 8.9 8.9 - 10.3 mg/dL   Total Protein 6.7 6.5 - 8.1 g/dL   Albumin 3.6 3.5 - 5.0 g/dL   AST 20 15 - 41 U/L   ALT 14 0 - 44 U/L   Alkaline Phosphatase 54 38 - 126 U/L   Total Bilirubin 1.0 0.3 - 1.2 mg/dL   GFR calc non Af Amer >60 >60 mL/min   GFR calc Af Amer >60 >60 mL/min   Anion gap 11 5 - 15    Comment: Performed at [facility], [place] [address], [place], [place] [address]  Type and screen     Status: None (Preliminary result)   Collection Time: 08/10/18  2:55 PM  Result Value Ref Range   ABO/RH(D) A POS    Antibody Screen NEG    Sample Expiration 08/13/2018    Unit Number [id number]    Blood Component Type RBC LR PHER2    Unit division 00    Status of Unit ISSUED     Transfusion Status OK TO TRANSFUSE    Crossmatch Result  Compatible Performed at [facility], [place] [address] [place], [place] [address]    Unit Number [id number]    Blood Component Type RED CELLS,LR    Unit division 00    Status of Unit ALLOCATED    Transfusion Status OK TO TRANSFUSE    Crossmatch Result Compatible    Unit Number [id number]    Blood Component Type RED CELLS,LR    Unit division 00    Status of Unit ALLOCATED    Transfusion Status OK TO TRANSFUSE    Crossmatch Result Compatible   ABO/Rh     Status: None   Collection Time: 08/10/18  2:55 PM  Result Value Ref Range   ABO/RH(D)      A POS Performed at [facility], [place] [address], [place], [place] [address]   Prepare RBC     Status: None   Collection Time: 08/10/18  2:58 PM  Result Value Ref Range   Order Confirmation      ORDER PROCESSED BY BLOOD BANK Performed at [facility], [place] [address], [place], [place] [address]   POC occult blood, ED     Status: Abnormal   Collection Time: 08/10/18  3:18 PM  Result Value Ref Range   Fecal Occult Bld POSITIVE (A) NEGATIVE    No results found.            Blood pressure (!) 122/56, pulse (!) 141, temperature 97.9 F (36.6 C), temperature source Oral, resp. rate (!) 22, height 6\' 1"  (1.854 m), weight (!) 163.3 kg, SpO2 99 %.  Physical exam:   General--morbidly obese white male ENT--neck is quite obese no gross lesions in his mouth  Heart--tachycardic Lungs--clear Abdomen--soft and nontender Psych--behavior appropriate, alert and oriented   Assessment: 1.  Hematemesis.  This is probably due to an ulcer exacerbated by anticoagulation.  His hemoglobin was 13 recently has dropped to 9 he has received a unit of blood. 2.  Atrial fibrillation anticoagulated on Xarelto.  He has been on medications for this in the past and is undergone prior cardioversion.  No Xarelto now for 3 days. 3.   Diabetes type 2. 4.  Gout 5.  Morbid obesity  Plan: I have discussed this with the patient and his cousin.  We will plan on EGD tomorrow.  Hopefully this can be done about 9:30 in the morning.   Nancy Fetter 08/10/2018, 6:06 PM   This note was created using voice recognition software and minor errors may Have occurred unintentionally. Pager: [phone] If no answer or after hours call [phone]

## 2018-08-10 NOTE — ED Notes (Signed)
Safety measures in place, family member at bedside, cont on continuous cardiac monitoring with int NBP monitoring and cont POX. Awaiting for 1 Unit PRBC to be ready from Blood Bank to transfuse per EDP verbal and written order

## 2018-08-10 NOTE — ED Notes (Addendum)
Report given to Amy, RN ICU

## 2018-08-10 NOTE — H&P (Signed)
History and Physical  Patient Name: Joe Blake     VOZ:[id number]    DOB: 11/28/1965    DOA: 08/10/2018 PCP: Charlott Rakes, MD  Patient coming from: Home  Chief Complaint: Hematemesis      HPI: Joe Blake is a 52 y.o. M with sCHF EF 30% in Sep 2019, Afib on Xarelto, HTN, and MO who presents with hematemesis and pre-syncope.  The patient was in his usual state of health until 2 days before admission (Wednesday night) when he awoke with nausea, went to the bathroom and vomited a large amount of coffee-ground emesis and had a large volume black diarrhea.  He vomited coffee-ground several more times that day, and began to feel dizzy.  Over the last 2 days, he has had no more BM/hematemesis, but the dizziness has persisted, he developed pallor, and today nearly passed out with standing, so his cousin Charleston Ropes finally convinced him to present to ER.    ED course: -Afebrile, heart rate 151, respirations and pulse ox normal, blood pressure 107/67 -Na 142, K 3.8, Cr 1.31 (baseline 1.2), WBC 14.2K, Hgb 9 (from baseline 13 just a few months ago) - He had melena on exam, FOBT+ -Given IV protonix and started on IV fluids and 1 unit PRBCs -Case discussed with Howie Ill, Dr. Oletta Lamas, who agreed to evaluate the patient in consultation -Hospitalist group were consulted for further management of UGIB  No previous history of GI bleed.  He stopped his Xarelto Weds.  He took a naproxen once a week ago, no other OTC analgesics.  No alcohol.   ROS: Review of Systems  Constitutional: Positive for chills, diaphoresis and malaise/fatigue. Negative for fever.  Respiratory: Negative for cough, sputum production, shortness of breath and wheezing.   Cardiovascular: Negative for chest pain, palpitations, leg swelling and PND.  Gastrointestinal: Positive for melena, nausea and vomiting. Negative for abdominal pain, constipation and diarrhea.  All other systems reviewed and are negative.         Past Medical  History:  Diagnosis Date  . Acute systolic HF (heart failure) ([place])   . Atrial fibrillation ([place])   . Diabetes mellitus without complication ([place])   . Hypertension   . Morbid obesity ([place])     Past Surgical History:  Procedure Laterality Date  . ABDOMINAL SURGERY    . APPENDECTOMY     at 52 years old  . CARDIOVERSION N/A 03/18/2018   Procedure: CARDIOVERSION;  Surgeon: Skeet Latch, MD;  Location: [facility];  Service: Cardiovascular;  Laterality: N/A;    Social History: Patient lives with his cousin.  He remodels houses for a living.  The patient walks unassisted.  Nonsmoker. No alcohol or illicit drugs.  No Known Allergies  Family history: family history includes Heart attack in his paternal grandfather; Hyperlipidemia in his mother; Hypertension in his mother.  Reports father died in sleep.  Prior to Admission medications   Medication Sig Start Date End Date Taking? Authorizing Provider  allopurinol (ZYLOPRIM) 300 MG tablet Take 1 tablet (300 mg total) by mouth daily. 05/20/18   Charlott Rakes, MD  carvedilol (COREG) 25 MG tablet Take 1 tablet (25 mg total) by mouth 2 (two) times daily with a meal. 01/02/18 12/28/18  Kilroy, Doreene Burke, PA-C  colchicine 0.6 MG tablet Take 1 tablet (0.6 mg total) by mouth once as needed (take 1.2mg  and then 0.6mg  1 hour later if still having symptoms). 05/20/18   Charlott Rakes, MD  furosemide (LASIX) 40 MG tablet Take  1 tablet (40 mg total) by mouth daily. 01/02/18   Erlene Quan, PA-C  losartan (COZAAR) 50 MG tablet Take 1 tablet (50 mg total) by mouth daily. 06/14/18 06/09/19  Minus Breeding, MD  rivaroxaban (XARELTO) 20 MG TABS tablet Take 1 tablet (20 mg total) by mouth daily with supper. 06/14/18   Minus Breeding, MD       Physical Exam: BP (!) 122/56   Pulse (!) 141   Temp 97.9 F (36.6 C) (Oral)   Resp (!) 22   Ht 6\' 1"  (1.854 m)   Wt (!) 163.3 kg   SpO2 99%   BMI 47.50 kg/m  General appearance: Well-developed, obese adult  male, alert and in no acute distress but appears tired, orthostatic with sittin gup.   Eyes: Anicteric, conjunctiva pale, lids and lashes normal. PERRL.    ENT: No nasal deformity, discharge, epistaxis.  Hearing normal. OP moist without lesions.  Lips normal, dentition reasonable. Neck: No neck masses.  Trachea midline.  No thyromegaly/tenderness. Lymph: No cervical or supraclavicular lymphadenopathy. Skin: Warm and dry.  Pale.  No jaundice.  No suspicious rashes or lesions. Cardiac: Cardiac, regular, nl S1-S2, no murmurs appreciated.  Capillary refill is brisk.  JVP not visible.  Trace LE edema.  Radial and DP pulses 2+ and symmetric. Respiratory: Normal respiratory rate and rhythm.  CTAB without rales or wheezes. Abdomen: Abdomen soft.  No TTP or guarding. No ascites, distension, hepatosplenomegaly.   MSK: No deformities or effusions.  No cyanosis or clubbing.  Normal muscle bulk and tone. Neuro: Cranial nerves 3 through 12 intact.  Sensation intact to light touch. Speech is fluent.  Muscle strength 5/5 and metric, coordination normal.    Psych: Sensorium intact and responding to questions, attention normal.  Behavior appropriate.  Affect pleasant, but appears a little bit tired/wiped.  Judgment and insight appear normal.     Labs on Admission:  I have personally reviewed following labs and imaging studies: CBC: Recent Labs  Lab 08/10/18 1453  WBC 14.2*  NEUTROABS 9.1*  HGB 9.0*  HCT 29.4*  MCV 95.1  PLT 254   Basic Metabolic Panel: Recent Labs  Lab 08/10/18 1453  NA 142  K 3.8  CL 108  CO2 23  GLUCOSE 150*  BUN 53*  CREATININE 1.31*  CALCIUM 8.9   GFR: Estimated Creatinine Clearance: 106.9 mL/min (A) (by C-G formula based on SCr of 1.31 mg/dL (H)).  Liver Function Tests: Recent Labs  Lab 08/10/18 1453  AST 20  ALT 14  ALKPHOS 54  BILITOT 1.0  PROT 6.7  ALBUMIN 3.6        Radiological Exams on Admission: None      EKG: Independently reviewed.  Rate  144, atrial flutter, no ST changes.    Assessment/Plan  Hematemesis  Suspect upper GI bleed.  At this time, his BP is normal, no clinical bleeding since Thurs, Xarelto held since Thurs. -Hold Xarelto -IV PPI BID -Clears and NPO at midnight for EGD tomorrow per EDP report    Acute blood loss anemia  From UGIB.  Baseline Hgb 12-13 g/dL.  Currently 9, probably hemoconcentrated per history. -Transfuse 1 unit -Trend H/H q6hrs -Transfusion threshold 7 g/dL  Atrial fibrillation with rapid ventricular rate CHADS2Vasc 3 (HTN, CHF, diabetes).  Rate currently in 140s. -Hold Xarelto -Metoprolol IV once now -Restart home carvedilol -monitor on Tele  Chronic systolic CHF Hypertension  Appears euvolemic.  EF 30-35% and globally weak on Echo Sep 2019.  Don't see ischemic  work up.  States he takes Furosemide "as needed" for indents from his socks or belt too tight, about 2x per week.  Does not weigh daily anymore. -Hold losartan until hemodynamics clearer -Hold furosemide for now -Continue carvedilol  Diabetes Chart history only, patient denies. -Check HgbA1c  Gout  Not active -Continue allopurinol  Suspected OSA  Sleep study results pending.     DVT prophylaxis: SCDs  Code Status: FULL  Family Communication: Cousin at bedside  Disposition Plan: Anticipate IV fluids, IV PPI, NPO at midnight for scope.  Transfuse 1 unit, close monitoring of Hgb and transfusion for H/H <7 Consults called: GI, Dr. Oletta Lamas Admission status: INPASTIENT          Medical decision making: Patient seen at 5:21 PM on 08/10/2018.  The patient was discussed with Dr. Winfred Leeds.  What exists of the patient's chart was reviewed in depth and summarized above.  Clinical condition: tachycardic but BP normal, mentation good.  Will place in stepdown for close monitoring.        At the time of admission, it appears that the appropriate admission status for this patient is INPATIENT. This is judged to be  reasonable and necessary in order to provide the required intensity of service to ensure the patient's safety given the presenting symptoms, physical exam findings, and initial radiographic and laboratory data in the context of their chronic comorbidities.  Together, these circumstances are felt to place him at high risk for further clinical deterioration threatening life, limb, or organ.   Patient requires inpatient status due to high intensity of service, high risk for further deterioration and high frequency of surveillance required because of this acute illness that poses a threat to life, limb or bodily function.  I certify that at the point of admission it is my clinical judgment that the patient will require inpatient hospital care spanning beyond 2 midnights from the point of admission and that early discharge would result in unnecessary risk of decompensation and readmission or threat to life, limb or bodily function.      Edwin Dada [facility] Pager [phone]

## 2018-08-10 NOTE — Consult Note (Signed)
[place] Reason for consult: Hematemesis. Referring Physician: ER.  PCP Dr.Newlin.  Primary GI: None patient unassigned  Joe Blake is an 52 y.o. male.  HPI: He has morbid obesity and atrial fib with a known RVR.  According to him he has been shocked in the past.  He follows up now with Dr. Orie Fisherman is on Xarelto.  It is felt that he likely has sleep apnea as well.  In the notes there is indication that he has an ejection fraction in the past of 20 to 25% but apparently lately it is felt that he is doing better. He denies any prior history of ulcer disease prior colonoscopy etc.  He denies chronic reflux although he does have this occasionally and will occasionally take Rolaids this is not a very common thing.  Wednesday evening he awoke during the night after taking his regular medicines with vomiting a large amount of coffee-ground material and then had a large quantity of blackish diarrhea.  He had several more episodes of vomiting throughout the day began to feel dizzy but did not have any further bowel movements.  He felt so weak and nearly passed out and his cousin convinced him to come to the emergency room.  Here in the emergency room his pulse is been 1 40-1 50 he had melena on exam and stool was positive for FOB.  He has been given fluids and IV Protonix.  Is also been given metoprolol due to his rapid heart rate.  The question was asked several times and he indicates that his last dose of Xarelto was Wednesday.  He had taken Aleve a week ago for some arthritic type pain and absolutely denies taking any further NSAIDs.  He is a nondrinker and is never had liver disease.  Past Medical History:  Diagnosis Date  . Acute systolic HF (heart failure) ([place])   . Atrial fibrillation ([place])   . Diabetes mellitus without complication ([place])   . Hypertension   . Morbid obesity ([place])     Past Surgical History:  Procedure Laterality Date  . ABDOMINAL SURGERY    . APPENDECTOMY      at 52 years old  . CARDIOVERSION N/A 03/18/2018   Procedure: CARDIOVERSION;  Surgeon: Skeet Latch, MD;  Location: [facility] ENDOSCOPY;  Service: Cardiovascular;  Laterality: N/A;    Family History  Problem Relation Age of Onset  . Heart attack Paternal Grandfather   . Hyperlipidemia Mother   . Hypertension Mother     Social History:  reports that he has never smoked. He has never used smokeless tobacco. He reports that he does not drink alcohol or use drugs.  Allergies: No Known Allergies  Medications; Prior to Admission medications   Medication Sig Start Date End Date Taking? Authorizing Provider  allopurinol (ZYLOPRIM) 300 MG tablet Take 1 tablet (300 mg total) by mouth daily. 05/20/18  Yes Charlott Rakes, MD  colchicine 0.6 MG tablet Take 1 tablet (0.6 mg total) by mouth once as needed (take 1.2mg  and then 0.6mg  1 hour later if still having symptoms). 05/20/18  Yes Charlott Rakes, MD  furosemide (LASIX) 40 MG tablet Take 1 tablet (40 mg total) by mouth daily. 01/02/18  Yes Kilroy, Luke K, PA-C  losartan (COZAAR) 50 MG tablet Take 1 tablet (50 mg total) by mouth daily. 06/14/18 06/09/19 Yes Minus Breeding, MD  naproxen sodium (ALEVE) 220 MG tablet Take 440 mg by mouth daily as needed (pain).   Yes [provider]  carvedilol (  COREG) 25 MG tablet Take 1 tablet (25 mg total) by mouth 2 (two) times daily with a meal. 01/02/18 12/28/18  Kilroy, Doreene Burke, PA-C  rivaroxaban (XARELTO) 20 MG TABS tablet Take 1 tablet (20 mg total) by mouth daily with supper. 06/14/18   Minus Breeding, MD   . Derrill Memo ON 08/11/2018] allopurinol  300 mg Oral Daily  . [START ON 08/11/2018] carvedilol  25 mg Oral BID WC  . pantoprazole  40 mg Intravenous Q12H   PRN Meds acetaminophen **OR** acetaminophen, polyethylene glycol Results for orders placed or performed during the hospital encounter of 08/10/18 (from the past 48 hour(s))  CBC with Differential/Platelet     Status: Abnormal   Collection Time:  08/10/18  2:53 PM  Result Value Ref Range   WBC 14.2 (H) 4.0 - 10.5 K/uL   RBC 3.09 (L) 4.22 - 5.81 MIL/uL   Hemoglobin 9.0 (L) 13.0 - 17.0 g/dL   HCT 29.4 (L) 39.0 - 52.0 %   MCV 95.1 80.0 - 100.0 fL   MCH 29.1 26.0 - 34.0 pg   MCHC 30.6 30.0 - 36.0 g/dL   RDW 15.1 11.5 - 15.5 %   Platelets 391 150 - 400 K/uL   nRBC 0.0 0.0 - 0.2 %   Neutrophils Relative % 64 %   Neutro Abs 9.1 (H) 1.7 - 7.7 K/uL   Lymphocytes Relative 27 %   Lymphs Abs 3.8 0.7 - 4.0 K/uL   Monocytes Relative 6 %   Monocytes Absolute 0.9 0.1 - 1.0 K/uL   Eosinophils Relative 1 %   Eosinophils Absolute 0.2 0.0 - 0.5 K/uL   Basophils Relative 1 %   Basophils Absolute 0.1 0.0 - 0.1 K/uL   Immature Granulocytes 1 %   Abs Immature Granulocytes 0.13 (H) 0.00 - 0.07 K/uL    Comment: Performed at [facility], [place] [address], [place], [place] [address]  Comprehensive metabolic panel     Status: Abnormal   Collection Time: 08/10/18  2:53 PM  Result Value Ref Range   Sodium 142 135 - 145 mmol/L   Potassium 3.8 3.5 - 5.1 mmol/L   Chloride 108 98 - 111 mmol/L   CO2 23 22 - 32 mmol/L   Glucose, Bld 150 (H) 70 - 99 mg/dL   BUN 53 (H) 6 - 20 mg/dL   Creatinine, Ser 1.31 (H) 0.61 - 1.24 mg/dL   Calcium 8.9 8.9 - 10.3 mg/dL   Total Protein 6.7 6.5 - 8.1 g/dL   Albumin 3.6 3.5 - 5.0 g/dL   AST 20 15 - 41 U/L   ALT 14 0 - 44 U/L   Alkaline Phosphatase 54 38 - 126 U/L   Total Bilirubin 1.0 0.3 - 1.2 mg/dL   GFR calc non Af Amer >60 >60 mL/min   GFR calc Af Amer >60 >60 mL/min   Anion gap 11 5 - 15    Comment: Performed at [facility], [place] [address] [place], [place] [address]  Type and screen     Status: None (Preliminary result)   Collection Time: 08/10/18  2:55 PM  Result Value Ref Range   ABO/RH(D) A POS    Antibody Screen NEG    Sample Expiration 08/13/2018    Unit Number [id number]    Blood Component Type RBC LR PHER2    Unit division 00    Status of Unit ISSUED     Transfusion Status OK TO TRANSFUSE    Crossmatch Result  Compatible Performed at [facility], [place] [address] [place], [place] [address]    Unit Number [id number]    Blood Component Type RED CELLS,LR    Unit division 00    Status of Unit ALLOCATED    Transfusion Status OK TO TRANSFUSE    Crossmatch Result Compatible    Unit Number [id number]    Blood Component Type RED CELLS,LR    Unit division 00    Status of Unit ALLOCATED    Transfusion Status OK TO TRANSFUSE    Crossmatch Result Compatible   ABO/Rh     Status: None   Collection Time: 08/10/18  2:55 PM  Result Value Ref Range   ABO/RH(D)      A POS Performed at [facility], [place] [address] [place], [place] [address]   Prepare RBC     Status: None   Collection Time: 08/10/18  2:58 PM  Result Value Ref Range   Order Confirmation      ORDER PROCESSED BY BLOOD BANK Performed at [facility], [place] [address] [place],  [address]   POC occult blood, ED     Status: Abnormal   Collection Time: 08/10/18  3:18 PM  Result Value Ref Range   Fecal Occult Bld POSITIVE (A) NEGATIVE    No results found.            Blood pressure (!) 122/56, pulse (!) 141, temperature 97.9 F (36.6 C), temperature source Oral, resp. rate (!) 22, height 6\' 1"  (1.854 m), weight (!) 163.3 kg, SpO2 99 %.  Physical exam:   General--morbidly obese white male ENT--neck is quite obese no gross lesions in his mouth  Heart--tachycardic Lungs--clear Abdomen--soft and nontender Psych--behavior appropriate, alert and oriented   Assessment: 1.  Hematemesis.  This is probably due to an ulcer exacerbated by anticoagulation.  His hemoglobin was 13 recently has dropped to 9 he has received a unit of blood. 2.  Atrial fibrillation anticoagulated on Xarelto.  He has been on medications for this in the past and is undergone prior cardioversion.  No Xarelto now for 3 days. 3.   Diabetes type 2. 4.  Gout 5.  Morbid obesity  Plan: I have discussed this with the patient and his cousin.  We will plan on EGD tomorrow.  Hopefully this can be done about 9:30 in the morning.   Nancy Fetter 08/10/2018, 6:06 PM   This note was created using voice recognition software and minor errors may Have occurred unintentionally. Pager: [phone] If no answer or after hours call [phone]

## 2018-08-10 NOTE — ED Notes (Signed)
ED TO INPATIENT HANDOFF REPORT  Name/Age/Gender Joe Blake 52 y.o. male  Code Status    Code Status Orders  (From admission, onward)         Start     Ordered   08/10/18 1718  Full code  Continuous     08/10/18 1720        Code Status History    Date Active Date Inactive Code Status Order ID Comments User Context   11/19/2017 2304 11/26/2017 1804 Full Code [id number]  Etta Quill, DO ED      Home/SNF/Other Home  Chief Complaint dizziness, vomiting, difficulty breathing  Level of Care/Admitting Diagnosis ED Disposition    ED Disposition Condition [place]: [place] [[id number]]  Level of Care: Stepdown [14]  Admit to SDU based on following criteria: Hemodynamic compromise or significant risk of instability:  Patient requiring short term acute titration and management of vasoactive drips, and invasive monitoring (i.e., CVP and Arterial line).  Diagnosis: Hematemesis [578.0.ICD-9-CM]  Admitting Physician: Edwin Dada [[id number]]  Attending Physician: Edwin Dada [[id number]]  Estimated length of stay: past midnight tomorrow  Certification:: I certify this patient will need inpatient services for at least 2 midnights  PT Class (Do Not Modify): Inpatient [101]  PT Acc Code (Do Not Modify): Private [1]       Medical History Past Medical History:  Diagnosis Date  . Acute systolic HF (heart failure) ([place])   . Atrial fibrillation ([place])   . Diabetes mellitus without complication ([place])   . Hypertension   . Morbid obesity ([place])     Allergies No Known Allergies  IV Location/Drains/Wounds Patient Lines/Drains/Airways Status   Active Line/Drains/Airways    Name:   Placement date:   Placement time:   Site:   Days:   Peripheral IV 08/10/18 Left;Upper Arm   08/10/18    1450    Arm   less than 1   Peripheral IV 08/10/18 Left Forearm   08/10/18    1527    Forearm   less than 1           Labs/Imaging Results for orders placed or performed during the hospital encounter of 08/10/18 (from the past 48 hour(s))  CBC with Differential/Platelet     Status: Abnormal   Collection Time: 08/10/18  2:53 PM  Result Value Ref Range   WBC 14.2 (H) 4.0 - 10.5 K/uL   RBC 3.09 (L) 4.22 - 5.81 MIL/uL   Hemoglobin 9.0 (L) 13.0 - 17.0 g/dL   HCT 29.4 (L) 39.0 - 52.0 %   MCV 95.1 80.0 - 100.0 fL   MCH 29.1 26.0 - 34.0 pg   MCHC 30.6 30.0 - 36.0 g/dL   RDW 15.1 11.5 - 15.5 %   Platelets 391 150 - 400 K/uL   nRBC 0.0 0.0 - 0.2 %   Neutrophils Relative % 64 %   Neutro Abs 9.1 (H) 1.7 - 7.7 K/uL   Lymphocytes Relative 27 %   Lymphs Abs 3.8 0.7 - 4.0 K/uL   Monocytes Relative 6 %   Monocytes Absolute 0.9 0.1 - 1.0 K/uL   Eosinophils Relative 1 %   Eosinophils Absolute 0.2 0.0 - 0.5 K/uL   Basophils Relative 1 %   Basophils Absolute 0.1 0.0 - 0.1 K/uL   Immature Granulocytes 1 %   Abs Immature Granulocytes 0.13 (H) 0.00 - 0.07 K/uL    Comment: Performed at [facility], [address] [place], [place] [address]  Comprehensive metabolic panel     Status: Abnormal   Collection Time: 08/10/18  2:53 PM  Result Value Ref Range   Sodium 142 135 - 145 mmol/L   Potassium 3.8 3.5 - 5.1 mmol/L   Chloride 108 98 - 111 mmol/L   CO2 23 22 - 32 mmol/L   Glucose, Bld 150 (H) 70 - 99 mg/dL   BUN 53 (H) 6 - 20 mg/dL   Creatinine, Ser 1.31 (H) 0.61 - 1.24 mg/dL   Calcium 8.9 8.9 - 10.3 mg/dL   Total Protein 6.7 6.5 - 8.1 g/dL   Albumin 3.6 3.5 - 5.0 g/dL   AST 20 15 - 41 U/L   ALT 14 0 - 44 U/L   Alkaline Phosphatase 54 38 - 126 U/L   Total Bilirubin 1.0 0.3 - 1.2 mg/dL   GFR calc non Af Amer >60 >60 mL/min   GFR calc Af Amer >60 >60 mL/min   Anion gap 11 5 - 15    Comment: Performed at [facility], [place] [address] [place], [place] [address]  Type and screen     Status: None (Preliminary result)   Collection Time: 08/10/18  2:55 PM  Result Value Ref  Range   ABO/RH(D) A POS    Antibody Screen NEG    Sample Expiration 08/13/2018    Unit Number [id number]    Blood Component Type RBC LR PHER2    Unit division 00    Status of Unit ISSUED    Transfusion Status OK TO TRANSFUSE    Crossmatch Result      Compatible Performed at [place] [address] [place], [place] [address]    Unit Number [id number]    Blood Component Type RED CELLS,LR    Unit division 00    Status of Unit ALLOCATED    Transfusion Status OK TO TRANSFUSE    Crossmatch Result Compatible    Unit Number [id number]    Blood Component Type RED CELLS,LR    Unit division 00    Status of Unit ALLOCATED    Transfusion Status OK TO TRANSFUSE    Crossmatch Result Compatible   ABO/Rh     Status: None   Collection Time: 08/10/18  2:55 PM  Result Value Ref Range   ABO/RH(D)      A POS Performed at [facility], [place] [address], [place], [place] [address]   Prepare RBC     Status: None   Collection Time: 08/10/18  2:58 PM  Result Value Ref Range   Order Confirmation      ORDER PROCESSED BY BLOOD BANK Performed at [facility], [place] [address], [place],  [address]   POC occult blood, ED     Status: Abnormal   Collection Time: 08/10/18  3:18 PM  Result Value Ref Range   Fecal Occult Bld POSITIVE (A) NEGATIVE   No results found. EKG Interpretation  Date/Time:  Saturday August 10 2018 14:45:56 EST Ventricular Rate:  144 PR Interval:    QRS Duration: 132 QT Interval:  336 QTC Calculation: 521 R Axis:   -7 Text Interpretation:  Sinus tachycardia LVH with secondary repolarization abnormality Prolonged QT interval Baseline wander in lead(s) I SINCE LAST TRACING HEART RATE HAS INCREASED Confirmed by Orlie Dakin [phone]) on 08/10/2018 4:58:29 PM   Pending Labs Unresulted Labs (From admission, onward)    Start     Ordered  08/11/18 0500  Hemoglobin A1c  Tomorrow morning,   R      08/10/18 1720   08/11/18 0500  CBC  Daily,   R     08/10/18 1720   08/11/18 4818  Basic metabolic panel  Daily,   R     08/10/18 1720   08/10/18 2300  Hemoglobin and hematocrit, blood  Now then every 12 hours,   R     08/10/18 1720          Vitals/Pain Today's Vitals   08/10/18 1615 08/10/18 1630 08/10/18 1645 08/10/18 1700  BP: (!) 135/94 119/84 128/89 (!) 122/56  Pulse:      Resp: 20 18 (!) 25 (!) 22  Temp:      TempSrc:      SpO2: 98% 100% 100% 99%  Weight:      Height:      PainSc:        Isolation Precautions No active isolations  Medications Medications  allopurinol (ZYLOPRIM) tablet 300 mg (has no administration in time range)  carvedilol (COREG) tablet 25 mg (has no administration in time range)  0.9 %  sodium chloride infusion (has no administration in time range)  acetaminophen (TYLENOL) tablet 650 mg (has no administration in time range)    Or  acetaminophen (TYLENOL) suppository 650 mg (has no administration in time range)  polyethylene glycol (MIRALAX / GLYCOLAX) packet 17 g (has no administration in time range)  pantoprazole (PROTONIX) injection 40 mg (has no administration in time range)  sodium chloride 0.9 % bolus 1,000 mL (0 mLs Intravenous Stopped 08/10/18 1529)  pantoprazole (PROTONIX) injection 40 mg (40 mg Intravenous Given 08/10/18 1755)  metoprolol tartrate (LOPRESSOR) injection 5 mg (5 mg Intravenous Given 08/10/18 1750)    Mobility walks

## 2018-08-10 NOTE — ED Provider Notes (Signed)
[facility] DEPT Provider Note   CSN: [id number] Arrival date & time: 08/10/18  1427    Level 5 caveat unstable vital signs History   Chief Complaint Chief Complaint  Patient presents with  . Near Syncope    HPI Joe Blake is a 52 y.o. male.  Patient reports he vomited black material 4 days ago.  For the past 4 days he has felt extremely lightheaded worse with standing and improved with lying down.  He presented today as he had a near syncopal event.  He denies chest pain denies abdominal pain denies shortness of breath.  Denies other complaint.  He last took his Xarelto 2 or 3 days ago.  He is uncertain if he has had blood per rectum.  No other associated symptoms  HPI  Past Medical History:  Diagnosis Date  . Acute systolic HF (heart failure) ([place])   . Atrial fibrillation ([place])   . Diabetes mellitus without complication ([place])   . Hypertension   . Morbid obesity [facility])     Patient Active Problem List   Diagnosis Date Noted  . Sleep apnea 06/14/2018  . Plantar fasciitis 05/29/2018  . Atrial flutter ([place]) 04/22/2018  . Renal insufficiency 01/02/2018  . Anticoagulated 12/10/2017  . Gout 12/03/2017  . Prediabetes 12/03/2017  . Morbid obesity ([place])   . Acute systolic HF (heart failure) ([place])   . Persistent atrial fibrillation 11/19/2017  . Acute on chronic systolic CHF (congestive heart failure) ([place]) 11/19/2017  . DM2 (diabetes mellitus, type 2) ([place]) 11/19/2017  . HTN (hypertension) 11/19/2017  . Obesity, Class III, BMI 40-49.9 (morbid obesity) ([place]) 11/19/2017  . CKD (chronic kidney disease) stage 3, GFR 30-59 ml/min (H[facility]) 11/19/2017  . LBBB (left bundle branch block) 11/19/2017    Past Surgical History:  Procedure Laterality Date  . ABDOMINAL SURGERY    . APPENDECTOMY     at 52 years old  . CARDIOVERSION N/A 03/18/2018   Procedure: CARDIOVERSION;  Surgeon: Skeet Latch, MD;  Location: [facility] ENDOSCOPY;  Service: Cardiovascular;   Laterality: N/A;        Home Medications    Prior to Admission medications   Medication Sig Start Date End Date Taking? Authorizing Provider  allopurinol (ZYLOPRIM) 300 MG tablet Take 1 tablet (300 mg total) by mouth daily. 05/20/18   Charlott Rakes, MD  carvedilol (COREG) 25 MG tablet Take 1 tablet (25 mg total) by mouth 2 (two) times daily with a meal. 01/02/18 12/28/18  Kilroy, Doreene Burke, PA-C  colchicine 0.6 MG tablet Take 1 tablet (0.6 mg total) by mouth once as needed (take 1.2mg  and then 0.6mg  1 hour later if still having symptoms). 05/20/18   Charlott Rakes, MD  furosemide (LASIX) 40 MG tablet Take 1 tablet (40 mg total) by mouth daily. 01/02/18   Erlene Quan, PA-C  losartan (COZAAR) 50 MG tablet Take 1 tablet (50 mg total) by mouth daily. 06/14/18 06/09/19  Minus Breeding, MD  rivaroxaban (XARELTO) 20 MG TABS tablet Take 1 tablet (20 mg total) by mouth daily with supper. 06/14/18   Minus Breeding, MD    Family History Family History  Problem Relation Age of Onset  . Heart attack Paternal Grandfather   . Hyperlipidemia Mother     Social History Social History   Tobacco Use  . Smoking status: Never Smoker  . Smokeless tobacco: Never Used  Substance Use Topics  . Alcohol use: No    Frequency: Never  . Drug use: No  Allergies   Lisinopril   Review of Systems Review of Systems  Unable to perform ROS: Unstable vital signs  Gastrointestinal: Positive for vomiting.  Neurological: Positive for light-headedness.  Hematological: Bruises/bleeds easily.     Physical Exam Updated Vital Signs BP (!) 135/94   Pulse (!) 141   Temp 97.9 F (36.6 C) (Oral)   Resp 18   Ht 6\' 1"  (1.854 m)   Wt (!) 163.3 kg   SpO2 99%   BMI 47.50 kg/m   Physical Exam  Constitutional:  Ill-appearing  HENT:  Head: Normocephalic and atraumatic.  Mucous membranes dry, pale  Eyes: Pupils are equal, round, and reactive to light.  Conjunctiva pale  Neck: Neck supple. No tracheal  deviation present. No thyromegaly present.  Cardiovascular: Regular rhythm.  No murmur heard. Tachycardic  Pulmonary/Chest: Effort normal and breath sounds normal.  Abdominal: Soft. Bowel sounds are normal. He exhibits no distension. There is no tenderness.  Genitourinary: Rectal exam shows guaiac positive stool.  Genitourinary Comments: Dr. normal tone maroon stool gross melena.  Musculoskeletal: Normal range of motion. He exhibits no edema or tenderness.  Neurological: He is alert. Coordination normal.  Skin: Skin is warm. No rash noted.  Grossly diaphoretic  Psychiatric:  Anxious  Nursing note and vitals reviewed.    ED Treatments / Results  Labs (all labs ordered are listed, but only abnormal results are displayed) Labs Reviewed  CBC WITH DIFFERENTIAL/PLATELET - Abnormal; Notable for the following components:      Result Value   WBC 14.2 (*)    RBC 3.09 (*)    Hemoglobin 9.0 (*)    HCT 29.4 (*)    Neutro Abs 9.1 (*)    Abs Immature Granulocytes 0.13 (*)    All other components within normal limits  COMPREHENSIVE METABOLIC PANEL - Abnormal; Notable for the following components:   Glucose, Bld 150 (*)    BUN 53 (*)    Creatinine, Ser 1.31 (*)    All other components within normal limits  POC OCCULT BLOOD, ED - Abnormal; Notable for the following components:   Fecal Occult Bld POSITIVE (*)    All other components within normal limits  OCCULT BLOOD X 1 CARD TO LAB, STOOL  TYPE AND SCREEN  PREPARE RBC (CROSSMATCH)  ABO/RH    EKG EKG Interpretation  Date/Time:  Saturday August 10 2018 14:45:56 EST Ventricular Rate:  144 PR Interval:    QRS Duration: 132 QT Interval:  336 QTC Calculation: 521 R Axis:   -7 Text Interpretation:  Sinus tachycardia LVH with secondary repolarization abnormality Prolonged QT interval Baseline wander in lead(s) I SINCE LAST TRACING HEART RATE HAS INCREASED Confirmed by Orlie Dakin [phone]) on 08/10/2018 4:58:29 PM   Radiology No  results found.  Procedures Procedures (including critical care time)  Medications Ordered in ED Medications  0.9 %  sodium chloride infusion (has no administration in time range)  pantoprazole (PROTONIX) injection 40 mg (has no administration in time range)  sodium chloride 0.9 % bolus 1,000 mL (0 mLs Intravenous Stopped 08/10/18 1529)     Initial Impression / Assessment and Plan / ED Course  I have reviewed the triage vital signs and the nursing notes.  Pertinent labs & imaging results that were available during my care of the patient were reviewed by me and considered in my medical decision making (see chart for details).     Patient was treated with packed red cells prior to lab work return as he was felt  to be in hemorrhagic shock.  After treatment with transfusion of 1 unit of packed red cells and 1 L of normal saline patient is sitting up in bed.  Skin warm and dry.  He looks much improved and feels improved.  Proton pump inhibitor iv protonix ordered.  I consulted Dr. Oletta Lamas from gastroenterology service who will see patient in the hospital and will plan on performing endoscopy tomorrow.  I have also consulted hospitalist physician Dr. Loleta Books who will arrange for admission Lab work remarkable for anemia, mild hyperglycemia and renal insufficiency.  Elevated BUN likely secondary to GI bleeding.  Patient also hypovolemic upon arrival. Final Clinical Impressions(s) / ED Diagnoses  #1 acute GI bleeding #2 hemorrhagic shock #3 anemia #4 hyperglycemia Final diagnoses:  None   CRITICAL CARE Performed by: Orlie Dakin Total critical care time: 45 minutes Critical care time was exclusive of separately billable procedures and treating other patients. Critical care was necessary to treat or prevent imminent or life-threatening deterioration. Critical care was time spent personally by me on the following activities: development of treatment plan with patient and/or surrogate as  well as nursing, discussions with consultants, evaluation of patient's response to treatment, examination of patient, obtaining history from patient or surrogate, ordering and performing treatments and interventions, ordering and review of laboratory studies, ordering and review of radiographic studies, pulse oximetry and re-evaluation of patient's condition. ED Discharge Orders    None       Orlie Dakin, MD 08/10/18 [phone]

## 2018-08-11 ENCOUNTER — Other Ambulatory Visit: Payer: Self-pay

## 2018-08-11 ENCOUNTER — Encounter (HOSPITAL_COMMUNITY)
Admission: EM | Disposition: A | Discharge status: Home / Self Care | Payer: Self-pay | Source: Home / Self Care | Attending: Family Medicine

## 2018-08-11 ENCOUNTER — Inpatient Hospital Stay (HOSPITAL_COMMUNITY): Payer: Medicaid Other | Admitting: Certified Registered"

## 2018-08-11 ENCOUNTER — Encounter (HOSPITAL_COMMUNITY): Payer: Self-pay | Admitting: *Deleted

## 2018-08-11 HISTORY — PX: ESOPHAGOGASTRODUODENOSCOPY (EGD) WITH PROPOFOL: SHX5813

## 2018-08-11 HISTORY — PX: BIOPSY: SHX5522

## 2018-08-11 LAB — CBC
HCT: 29.1 % — ABNORMAL LOW (ref 39.0–52.0)
Hemoglobin: 8.8 g/dL — ABNORMAL LOW (ref 13.0–17.0)
MCH: 29.2 pg (ref 26.0–34.0)
MCHC: 30.2 g/dL (ref 30.0–36.0)
MCV: 96.7 fL (ref 80.0–100.0)
Platelets: 299 10*3/uL (ref 150–400)
RBC: 3.01 MIL/uL — ABNORMAL LOW (ref 4.22–5.81)
RDW: 15.3 % (ref 11.5–15.5)
WBC: 11.6 10*3/uL — ABNORMAL HIGH (ref 4.0–10.5)
nRBC: 0 % (ref 0.0–0.2)

## 2018-08-11 LAB — BASIC METABOLIC PANEL
Anion gap: 8 (ref 5–15)
BUN: 42 mg/dL — AB (ref 6–20)
CALCIUM: 8.8 mg/dL — AB (ref 8.9–10.3)
CO2: 25 mmol/L (ref 22–32)
Chloride: 110 mmol/L (ref 98–111)
Creatinine, Ser: 1.32 mg/dL — ABNORMAL HIGH (ref 0.61–1.24)
GFR calc Af Amer: >60 mL/min (ref >60)
GFR calc non Af Amer: >60 mL/min (ref >60)
Glucose, Bld: 122 mg/dL — ABNORMAL HIGH (ref 70–99)
Potassium: 4 mmol/L (ref 3.5–5.1)
Sodium: 143 mmol/L (ref 135–145)

## 2018-08-11 LAB — HEMOGLOBIN AND HEMATOCRIT, BLOOD
HCT: 27.2 % — ABNORMAL LOW (ref 39.0–52.0)
HCT: 27.4 % — ABNORMAL LOW (ref 39.0–52.0)
Hemoglobin: 8.2 g/dL — ABNORMAL LOW (ref 13.0–17.0)
Hemoglobin: 8.2 g/dL — ABNORMAL LOW (ref 13.0–17.0)

## 2018-08-11 LAB — HEMOGLOBIN A1C
Hgb A1c MFr Bld: 5.6 % (ref 4.8–5.6)
Mean Plasma Glucose: 114.02 mg/dL

## 2018-08-11 LAB — MRSA PCR SCREENING: MRSA by PCR: NEGATIVE

## 2018-08-11 SURGERY — ESOPHAGOGASTRODUODENOSCOPY (EGD) WITH PROPOFOL
Anesthesia: Monitor Anesthesia Care

## 2018-08-11 MED ORDER — METOPROLOL TARTRATE 5 MG/5ML IV SOLN
5.0000 mg | Freq: Once | INTRAVENOUS | Status: AC
  Administered 2018-08-11: 5 mg via INTRAVENOUS
  Filled 2018-08-11: qty 5

## 2018-08-11 MED ORDER — SODIUM CHLORIDE 0.9 % IV SOLN
INTRAVENOUS | Status: DC
  Administered 2018-08-11: 13:00:00 via INTRAVENOUS

## 2018-08-11 MED ORDER — CARVEDILOL 25 MG PO TABS
25.0000 mg | ORAL_TABLET | Freq: Two times a day (BID) | ORAL | Status: DC
  Administered 2018-08-11 – 2018-08-12 (×4): 25 mg via ORAL
  Filled 2018-08-11: qty 1
  Filled 2018-08-11: qty 2
  Filled 2018-08-11: qty 1
  Filled 2018-08-11: qty 2

## 2018-08-11 MED ORDER — METOPROLOL TARTRATE 5 MG/5ML IV SOLN: 5.0000 mg | Freq: Four times a day (QID) | INTRAVENOUS | Status: DC | PRN

## 2018-08-11 MED ORDER — METOPROLOL TARTRATE 5 MG/5ML IV SOLN: 5.0000 mg | Freq: Three times a day (TID) | INTRAVENOUS | Status: DC | PRN

## 2018-08-11 MED ORDER — PANTOPRAZOLE SODIUM 40 MG PO TBEC
40.0000 mg | DELAYED_RELEASE_TABLET | Freq: Two times a day (BID) | ORAL | Status: DC
  Administered 2018-08-11 – 2018-08-15 (×8): 40 mg via ORAL
  Filled 2018-08-11 (×8): qty 1

## 2018-08-11 MED ORDER — PROPOFOL 10 MG/ML IV BOLUS
INTRAVENOUS | Status: AC
  Filled 2018-08-11: qty 60

## 2018-08-11 MED ORDER — METOPROLOL TARTRATE 25 MG PO TABS: 25.0000 mg | ORAL_TABLET | Freq: Once | ORAL | Status: DC

## 2018-08-11 MED ORDER — LOSARTAN POTASSIUM 50 MG PO TABS
50.0000 mg | ORAL_TABLET | Freq: Every day | ORAL | Status: DC
  Administered 2018-08-12: 50 mg via ORAL
  Filled 2018-08-11: qty 1

## 2018-08-11 MED ORDER — PROPOFOL 500 MG/50ML IV EMUL
INTRAVENOUS | Status: DC | PRN
  Administered 2018-08-11: 75 ug/kg/min via INTRAVENOUS

## 2018-08-11 MED ORDER — PROPOFOL 10 MG/ML IV BOLUS
INTRAVENOUS | Status: DC | PRN
  Administered 2018-08-11: 10 mg via INTRAVENOUS
  Administered 2018-08-11: 20 mg via INTRAVENOUS
  Administered 2018-08-11: 10 mg via INTRAVENOUS
  Administered 2018-08-11 (×2): 20 mg via INTRAVENOUS

## 2018-08-11 SURGICAL SUPPLY — 15 items

## 2018-08-11 NOTE — Interval H&P Note (Signed)
History and Physical Interval Note:  08/11/2018 1:11 PM  Joe Blake  has presented today for surgery, with the diagnosis of hematemesis  The various methods of treatment have been discussed with the patient and family. After consideration of risks, benefits and other options for treatment, the patient has consented to  Procedure(s): ESOPHAGOGASTRODUODENOSCOPY (EGD) WITH PROPOFOL (N/A) as a surgical intervention .  The patient's history has been reviewed, patient examined, no change in status, stable for surgery.  I have reviewed the patient's chart and labs.  Questions were answered to the patient's satisfaction.     Nancy Fetter

## 2018-08-11 NOTE — Anesthesia Preprocedure Evaluation (Addendum)
Anesthesia Evaluation  Patient identified by MRN, date of birth, ID band Patient awake    Reviewed: Allergy & Precautions, NPO status , Patient's Chart, lab work & pertinent test results  Airway Mallampati: II  TM Distance: <3 FB Neck ROM: Full    Dental no notable dental hx.    Pulmonary neg pulmonary ROS,    Pulmonary exam normal breath sounds clear to auscultation       Cardiovascular hypertension, +CHF  + dysrhythmias Atrial Fibrillation  Rhythm:Irregular Rate:Normal  EF 30%   Neuro/Psych negative neurological ROS  negative psych ROS   GI/Hepatic negative GI ROS, Neg liver ROS,   Endo/Other  diabetesMorbid obesity  Renal/GU Renal InsufficiencyRenal disease  negative genitourinary   Musculoskeletal negative musculoskeletal ROS (+)   Abdominal (+) + obese,   Peds negative pediatric ROS (+)  Hematology  (+) anemia ,   Anesthesia Other Findings   Reproductive/Obstetrics negative OB ROS                            Anesthesia Physical Anesthesia Plan  ASA: IV and emergent  Anesthesia Plan: MAC   Post-op Pain Management:    Induction: Intravenous  PONV Risk Score and Plan: 0  Airway Management Planned: Nasal Cannula  Additional Equipment:   Intra-op Plan:   Post-operative Plan:   Informed Consent: I have reviewed the patients History and Physical, chart, labs and discussed the procedure including the risks, benefits and alternatives for the proposed anesthesia with the patient or authorized representative who has indicated his/her understanding and acceptance.   Dental advisory given  Plan Discussed with: CRNA and Surgeon  Anesthesia Plan Comments:         Anesthesia Quick Evaluation

## 2018-08-11 NOTE — Progress Notes (Signed)
PROGRESS NOTE    Joe Blake  ALP:[id number] DOB: [birth year] DOA: 08/10/2018 PCP: Charlott Rakes, MD      Brief Narrative:  Joe Blake is a 52 y.o. M with sCHF EF 30% in Sep 2019, Afib on Xarelto, HTN, and MO who presents with hematemesis.  Pt was in [place] until 2 days before admission when he awoke with nausea, went to the bathroom and vomited a large amount of coffee-ground emesis and had a large volume black diarrhea.  Over next two days, felt dizzy with standing, but no more hematemesis or melena.  Finally family convinced him to come to ER.  In ER, Hgb was 9 g/dL down from baseline 13 and rectal exam notable for melena.  Also in [place] with RVR.  Started on IV protonix and transfused 1 unit.   Assessment & Plan:  Bleeding peptic ulcer Upper GI bleed EGD today showed nonbleeding 2 mm gastric ulcer, no report of clot.   -Hold Xarelto -Transition to oral PPI -Clear liquid diet -Consult gastroenterology, appreciate recommendations    Acute blood loss anemia  From UGIB.  Baseline Hgb 12-13 g/dL.    Hemoglobin stable overnight, transfuse 1 unit. -Trend Hgb -Transfusion threshold 7 g/dL  Atrial fibrillation with rapid ventricular rate CHADS2Vasc 2 (HTN, CHF). Rates normalized with carvedilol, slightly elevated with procedure. -Hold Xarelto -Continue carvedilol -IV metoprolol PRN for HR > 110 bpm -Transfer to telemetry  Chronic systolic CHF Hypertension  Euvolemic to hypovolemic.   EF 30-35% and globally weak on Echo Sep 2019.  Don't see ischemic work up.  States he takes Furosemide "as needed" for indents from his socks or belt too tight, about 2x per week.  Does not weigh daily anymore. -Restart losartan -Continue carvedilol -Hold furosemide (which he takes PRN)  Diabetes, ruled out There was a chart history of DM.  Not on meds and A1c 5.6%.  Fasting glucose this AM 120, pre-diabetic.  Gout  Not active -Continue allopurinol  Suspected OSA  Sleep study  results pending.        MDM and disposition: The below labs and imaging reports were reviewed and summarized above.  Medication management as above.  The patient was admitted with UGIB and acute blood loss anemia as well as Afib with RVR.  HRs improved, no active bleeding here.  Plan for advancing diet in next 24 hours, monitor HR. Likely home in 24 to 48 hours .         DVT prophylaxis: SCDs Code Status: FULL Family Communication: None present    Consultants:   GI  Procedures:   EGD Findings: Two linear esophageal ulcers with no bleeding were found at the gastroesophageal junction. Localized severe inflammation characterized by congestion (edema), erosions, linear erosions and deep ulcerations was found in the gastric antrum. Biopsies were taken with a cold forceps for histology. One non-bleeding cratered gastric ulcer was found in the gastric antrum. The lesion was 2 mm in largest dimension. The examined duodenum was normal. - Medium-sized hiatal hernia. - Non-bleeding esophageal ulcers. - Gastritis. Biopsied. - Non-bleeding gastric ulcer. This is probably the source of his bleeding exacerbated by Xarelto - Normal examined duodenum. Recommendations - Return patient to hospital ward for ongoing care. - Use Protonix (pantoprazole) 40 mg PO BID. - Return to endoscopist in 6 weeks. - Repeat upper endoscopy in 2 months to check healing.      Antimicrobials:   None    Subjective: Feeling well.  Tired.  No melena, hematochezia, hematemesis.  No vomiting, no diarrhea.  No confusion, no dizziness.     Objective: Vitals:   08/11/18 1200 08/11/18 1303 08/11/18 1343 08/11/18 1350  BP: (!) 150/92 (!) 156/96  124/79  Pulse: 96 100 (!) 102 (!) 108  Resp:  18 18 18   Temp: 98.3 F (36.8 C) 97.7 F (36.5 C)    TempSrc: Oral Oral    SpO2: 98% 97% 100% 99%  Weight:      Height:        Intake/Output Summary (Last 24 hours) at 08/11/2018 1431 Last data  filed at 08/11/2018 1341 Gross per 24 hour  Intake 990 ml  Output 1675 ml  Net -685 ml   Filed Weights   08/10/18 1438 08/10/18 1854  Weight: (!) 163.3 kg (!) 158.3 kg    Examination: General appearance: Obese adult male, alert and in no acute distress.   HEENT: Anicteric, conjunctiva pink, lids and lashes normal. No nasal deformity, discharge, epistaxis.  Lips moist, dentition normal, oropharynx moist, no oral lesions, hearing normal.   Skin: Warm and dry.  No jaundice.  No suspicious rashes or lesions. Cardiac: Cardiac, irregular, nl S1-S2, no murmurs appreciated.  Capillary refill is brisk.  JVP not visible.  No LE edema.  Radia  pulses 2+ and symmetric. Respiratory: Normal respiratory rate and rhythm.  CTAB without rales or wheezes. Abdomen: Abdomen soft.  No TTP or guarding. No ascites, distension, hepatosplenomegaly.   MSK: No deformities or effusions. Neuro: Awake and alert.  EOMI, moves all extremities. Speech fluent.    Psych: Sensorium intact and responding to questions, attention normal. Affect normal.  Judgment and insight appear normal.    Data Reviewed: I have personally reviewed following labs and imaging studies:  CBC: Recent Labs  Lab 08/10/18 1453 08/10/18 2019 08/11/18 0321 08/11/18 1048  WBC 14.2*  --  11.6*  --   NEUTROABS 9.1*  --   --   --   HGB 9.0* 9.3* 8.8* 8.2*  HCT 29.4* 30.1* 29.1* 27.2*  MCV 95.1  --  96.7  --   PLT 391  --  299  --    Basic Metabolic Panel: Recent Labs  Lab 08/10/18 1453 08/11/18 0321  NA 142 143  K 3.8 4.0  CL 108 110  CO2 23 25  GLUCOSE 150* 122*  BUN 53* 42*  CREATININE 1.31* 1.32*  CALCIUM 8.9 8.8*   GFR: Estimated Creatinine Clearance: 104.2 mL/min (A) (by C-G formula based on SCr of 1.32 mg/dL (H)). Liver Function Tests: Recent Labs  Lab 08/10/18 1453  AST 20  ALT 14  ALKPHOS 54  BILITOT 1.0  PROT 6.7  ALBUMIN 3.6   No results for input(s): LIPASE, AMYLASE in the last 168 hours. No results for  input(s): AMMONIA in the last 168 hours. Coagulation Profile: No results for input(s): INR, PROTIME in the last 168 hours. Cardiac Enzymes: No results for input(s): CKTOTAL, CKMB, CKMBINDEX, TROPONINI in the last 168 hours. BNP (last 3 results) No results for input(s): PROBNP in the last 8760 hours. HbA1C: Recent Labs    08/11/18 0321  HGBA1C 5.6   CBG: No results for input(s): GLUCAP in the last 168 hours. Lipid Profile: No results for input(s): CHOL, HDL, LDLCALC, TRIG, CHOLHDL, LDLDIRECT in the last 72 hours. Thyroid Function Tests: No results for input(s): TSH, T4TOTAL, FREET4, T3FREE, THYROIDAB in the last 72 hours. Anemia Panel: No results for input(s): VITAMINB12, FOLATE, FERRITIN, TIBC, IRON, RETICCTPCT in the last 72 hours. Urine analysis: No results  found for: COLORURINE, APPEARANCEUR, [place], [place], GLUCOSEU, HGBUR, BILIRUBINUR, KETONESUR, PROTEINUR, UROBILINOGEN, NITRITE, LEUKOCYTESUR Sepsis Labs: @LABRCNTIP (procalcitonin:4,lacticacidven:4)  ) Recent Results (from the past 240 hour(s))  MRSA PCR Screening     Status: None   Collection Time: 08/11/18  6:26 AM  Result Value Ref Range Status   MRSA by PCR NEGATIVE NEGATIVE Final    Comment:        The GeneXpert MRSA Assay (FDA approved for NASAL specimens only), is one component of a comprehensive MRSA colonization surveillance program. It is not intended to diagnose MRSA infection nor to guide or monitor treatment for MRSA infections. Performed at [facility], [place] [address] [place], [place] [address]          Radiology Studies: No results found.      Scheduled Meds: . allopurinol  300 mg Oral Daily  . carvedilol  25 mg Oral BID WC  . pantoprazole  40 mg Intravenous Q12H   Continuous Infusions:   LOS: 1 day    Time spent: 25 minutes    Edwin Dada, MD [facility] 08/11/2018, 2:31 PM     Please page through [place]:  [email or web address] Password  [email or web address] If 7PM-7AM, please contact night-coverage

## 2018-08-11 NOTE — Anesthesia Procedure Notes (Signed)
Date/Time: 08/11/2018 1:22 PM Performed by: Niel Hummer, CRNA Pre-anesthesia Checklist: Patient identified, Emergency Drugs available, Suction available and Patient being monitored Patient Re-evaluated:Patient Re-evaluated prior to induction Oxygen Delivery Method: Nasal cannula

## 2018-08-11 NOTE — Anesthesia Postprocedure Evaluation (Signed)
Anesthesia Post Note  Patient: Joe Blake  Procedure(s) Performed: ESOPHAGOGASTRODUODENOSCOPY (EGD) WITH PROPOFOL (N/A ) BIOPSY     Patient location during evaluation: PACU Anesthesia Type: MAC Level of consciousness: awake and alert Pain management: pain level controlled Vital Signs Assessment: post-procedure vital signs reviewed and stable Respiratory status: spontaneous breathing, nonlabored ventilation, respiratory function stable and patient connected to nasal cannula oxygen Cardiovascular status: stable and blood pressure returned to baseline Postop Assessment: no apparent nausea or vomiting Anesthetic complications: no    Last Vitals:  Vitals:   08/11/18 1343 08/11/18 1350  BP:  124/79  Pulse: (!) 102 (!) 108  Resp: 18 18  Temp:    SpO2: 100% 99%    Last Pain:  Vitals:   08/11/18 1350  TempSrc:   PainSc: 0-No pain                 [name] S

## 2018-08-11 NOTE — Op Note (Signed)
[facility] Patient Name: Joe Blake Procedure Date: 08/11/2018 MRN: [id number] Attending MD: Nancy Fetter Dr., MD Date of Birth: 1966/06/26 CSN: [id number] Age: 52 Admit Type: Inpatient Procedure:                Upper GI endoscopy Indications:              Hematemesis in gentleman who has been on Xarelto                            for severe atrial fib with RVR. His Xarelto has                            been on hold now for 3 days. Providers:                Joyice Faster. Maximino Cozzolino Dr., MD, Cleda Daub, RN,                            Laverda Sorenson, Technician, Leandro Reasoner, CRNA Referring MD:              Medicines:                Monitored Anesthesia Care Complications:            No immediate complications. Estimated Blood Loss:     Estimated blood loss: none. Procedure:                Pre-Anesthesia Assessment:                           - Prior to the procedure, a History and Physical                            was performed, and patient medications and                            allergies were reviewed. The patient's tolerance of                            previous anesthesia was also reviewed. The risks                            and benefits of the procedure and the sedation                            options and risks were discussed with the patient.                            All questions were answered, and informed consent                            was obtained. Prior Anticoagulants: The patient has                            taken Xarelto (rivaroxaban), last dose was 3 days  prior to procedure. ASA Grade Assessment: IV - A                            patient with severe systemic disease that is a                            constant threat to life. After reviewing the risks                            and benefits, the patient was deemed in                            satisfactory condition to undergo the procedure.      After obtaining informed consent, the endoscope was                            passed under direct vision. Throughout the                            procedure, the patient's blood pressure, pulse, and                            oxygen saturations were monitored continuously. The                            GIF-H190 ([id number]) Olympus adult endoscope was                            introduced through the mouth, and advanced to the                            second part of duodenum. The upper GI endoscopy was                            accomplished without difficulty. The patient                            tolerated the procedure well. Scope In: Scope Out: Findings:      A medium-sized hiatal hernia was present.      Two linear esophageal ulcers with no bleeding were found at the       gastroesophageal junction.      Localized severe inflammation characterized by congestion (edema),       erosions, linear erosions and deep ulcerations was found in the gastric       antrum. Biopsies were taken with a cold forceps for histology.      One non-bleeding cratered gastric ulcer was found in the gastric antrum.       The lesion was 2 mm in largest dimension.      The examined duodenum was normal. Impression:               - Medium-sized hiatal hernia.                           - Non-bleeding esophageal ulcers.                           -  Gastritis. Biopsied.                           - Non-bleeding gastric ulcer. This is probably the                            source of his bleeding exacerbated by Xarelto                           - Normal examined duodenum. Moderate Sedation:      See anesthesia note, no moderate sedation. Recommendation:           - Return patient to hospital ward for ongoing care.                           - Use Protonix (pantoprazole) 40 mg PO BID.                           - Return to endoscopist in 6 weeks.                           - Repeat upper endoscopy in 2 months  to check                            healing. Procedure Code(s):        --- Professional ---                           [phone], Esophagogastroduodenoscopy, flexible,                            transoral; with biopsy, single or multiple Diagnosis Code(s):        --- Professional ---                           K22.10, Ulcer of esophagus without bleeding                           K25.9, Gastric ulcer, unspecified as acute or                            chronic, without hemorrhage or perforation                           K92.0, Hematemesis                           K29.70, Gastritis, unspecified, without bleeding CPT copyright 2018 [employer]. All rights reserved. The codes documented in this report are preliminary and upon coder review may  be revised to meet current compliance requirements. Nancy Fetter Dr., MD 08/11/2018 1:49:39 PM This report has been signed electronically. Number of Addenda: 0

## 2018-08-11 NOTE — Progress Notes (Signed)
Consulted with ELink RN, Leveda Anna, regarding pt's persistent heart rate ranging from 130-140s and elevated blood pressure of 165/90. Per [facility], gave 5mg  Lopressor. Completed EKG showed sinus tachycardia. Follow up heartrate of 102, BP 147/77 after Lopressor. Will continue to monitor.

## 2018-08-11 NOTE — Transfer of Care (Signed)
Immediate Anesthesia Transfer of Care Note  Patient: Joe Blake  Procedure(s) Performed: ESOPHAGOGASTRODUODENOSCOPY (EGD) WITH PROPOFOL (N/A ) BIOPSY  Patient Location: PACU  Anesthesia Type:MAC  Level of Consciousness: awake, alert  and oriented  Airway & Oxygen Therapy: Patient Spontanous Breathing and Patient connected to nasal cannula oxygen  Post-op Assessment: Report given to RN and Post -op Vital signs reviewed and stable  Post vital signs: Reviewed and stable  Last Vitals:  Vitals Value Taken Time  BP    Temp    Pulse    Resp    SpO2      Last Pain:  Vitals:   08/11/18 1303  TempSrc: Oral  PainSc: 0-No pain         Complications: No apparent anesthesia complications

## 2018-08-12 ENCOUNTER — Encounter (HOSPITAL_COMMUNITY): Payer: Self-pay | Admitting: Gastroenterology

## 2018-08-12 ENCOUNTER — Other Ambulatory Visit: Payer: Self-pay | Admitting: Cardiovascular Disease

## 2018-08-12 ENCOUNTER — Telehealth: Payer: Self-pay | Admitting: *Deleted

## 2018-08-12 DIAGNOSIS — I4819 Other persistent atrial fibrillation: Secondary | ICD-10-CM

## 2018-08-12 DIAGNOSIS — G4733 Obstructive sleep apnea (adult) (pediatric): Secondary | ICD-10-CM

## 2018-08-12 LAB — BASIC METABOLIC PANEL
Anion gap: 8 (ref 5–15)
BUN: 27 mg/dL — ABNORMAL HIGH (ref 6–20)
CO2: 25 mmol/L (ref 22–32)
Calcium: 8.7 mg/dL — ABNORMAL LOW (ref 8.9–10.3)
Chloride: 109 mmol/L (ref 98–111)
Creatinine, Ser: 1.31 mg/dL — ABNORMAL HIGH (ref 0.61–1.24)
GFR calc Af Amer: >60 mL/min (ref >60)
GFR calc non Af Amer: >60 mL/min (ref >60)
Glucose, Bld: 113 mg/dL — ABNORMAL HIGH (ref 70–99)
POTASSIUM: 3.7 mmol/L (ref 3.5–5.1)
Sodium: 142 mmol/L (ref 135–145)

## 2018-08-12 LAB — CBC
HCT: 27.1 % — ABNORMAL LOW (ref 39.0–52.0)
Hemoglobin: 8.2 g/dL — ABNORMAL LOW (ref 13.0–17.0)
MCH: 29.3 pg (ref 26.0–34.0)
MCHC: 30.3 g/dL (ref 30.0–36.0)
MCV: 96.8 fL (ref 80.0–100.0)
Platelets: 274 10*3/uL (ref 150–400)
RBC: 2.8 MIL/uL — ABNORMAL LOW (ref 4.22–5.81)
RDW: 15.3 % (ref 11.5–15.5)
WBC: 9.3 10*3/uL (ref 4.0–10.5)
nRBC: 0 % (ref 0.0–0.2)

## 2018-08-12 MED ORDER — SODIUM CHLORIDE 0.9 % IV BOLUS
500.0000 mL | Freq: Once | INTRAVENOUS | Status: AC
  Administered 2018-08-12: 11:00:00 via INTRAVENOUS

## 2018-08-12 MED ORDER — CARVEDILOL 12.5 MG PO TABS: 12.5000 mg | ORAL_TABLET | Freq: Two times a day (BID) | ORAL | Status: DC

## 2018-08-12 NOTE — Progress Notes (Signed)
   08/12/18 1040  MEWS Score  BP (!) 90/52  Patient is alert and oriented x 4. Feeling tired.

## 2018-08-12 NOTE — Telephone Encounter (Signed)
Patient notified of HST results and recommendations. Patient agrees to proceed with CPAP titration study.

## 2018-08-12 NOTE — Progress Notes (Signed)
PROGRESS NOTE    JENNER ROSIER  ZOX:[id number] DOB: 12-22-1965 DOA: 08/10/2018 PCP: Charlott Rakes, MD      Brief Narrative:  Joe Blake is a 52 y.o. M with sCHF EF 30% in Sep 2019, Afib on Xarelto, HTN, and MO who presents with hematemesis.  Pt was in [place] until 2 days before admission when he awoke with nausea, went to the bathroom and vomited a large amount of coffee-ground emesis and had a large volume black diarrhea.  Over next two days, felt dizzy with standing, but no more hematemesis or melena.  Finally family convinced him to come to ER.  In ER, Hgb was 9 g/dL down from baseline 13 and rectal exam notable for melena.  Also in [place] with RVR.  Started on IV protonix and transfused 1 unit.         Assessment & Plan:  Bleeding peptic ulcer Upper GI bleed EGD 12/8 showed nonbleeding 2 mm gastric ulcer, no report of clot.   No further melena or vomiting.  -Hold Xarelto 1 week -Continue pantoprazole twice daily -Advance to soft diet -Consult gastroenterology, appreciate recommendations   Hypotension Patient hypotensive and dizzy this morning.  This began after starting his losartan this morning.  Blood pressure 90/50, no orthostasis.  No compensatory tachycardia due to carvedilol.  No more melena, hemoglobin stable this morning, at this point I do not suspect rebleeding. -IV fluids -Hold losartan -Monitor  Acute blood loss anemia  From UGIB.  Baseline Hgb 12-13 g/dL.    Hemoglobin stable 8.2 since admission.  Was transfused 1 unit admission.   -Continue trend Hgb -Transfusion threshold 7 g/dL  Atrial fibrillation with rapid ventricular rate CHADS2Vasc 2 (HTN, CHF). RVR resolved with home carvedilol. -Hold Xarelto -Continue carvedilol  Chronic systolic CHF Hypertension  EF 30-35% and globally weak on Echo Sep 2019.  Don't see ischemic work up.  States he takes Furosemide "as needed" for indents from his socks or belt too tight, about 2x per week.  Does not weigh  daily anymore.  -Continue carvedilol -Hold furosemide (which he takes PRN)  Diabetes, ruled out There was a chart history of DM.  Not on meds and A1c 5.6%.  Fasting glucose this AM 120, pre-diabetic.  Gout  Not active -Continue allopurinol  Suspected OSA  AHI 53 on sleep study. -CPAP at night        MDM and disposition: The below labs and imaging reports were reviewed and summarized above.  Medication management as above.  The patient was admitted with UGIB and acute blood loss anemia as well as Afib with RVR.  His A. fib resolved with restarting his home carvedilol.  He has had no further bleeding, and he is advancing his diet this morning.  Unfortunately he is hemodynamically unstable with hypotension after restarting losartan this morning as well as dizziness. We will stop losartan, give IV fluids, and monitor blood pressure.  If no further bleeding, blood pressure normalized, likely home tomorrow.        DVT prophylaxis: SCDs Code Status: FULL Family Communication: None present    Consultants:   GI  Procedures:   EGD Findings: Two linear esophageal ulcers with no bleeding were found at the gastroesophageal junction. Localized severe inflammation characterized by congestion (edema), erosions, linear erosions and deep ulcerations was found in the gastric antrum. Biopsies were taken with a cold forceps for histology. One non-bleeding cratered gastric ulcer was found in the gastric antrum. The lesion was 2 mm in  largest dimension. The examined duodenum was normal. - Medium-sized hiatal hernia. - Non-bleeding esophageal ulcers. - Gastritis. Biopsied. - Non-bleeding gastric ulcer. This is probably the source of his bleeding exacerbated by Xarelto - Normal examined duodenum. Recommendations - Return patient to hospital ward for ongoing care. - Use Protonix (pantoprazole) 40 mg PO BID. - Return to endoscopist in 6 weeks. - Repeat upper endoscopy in 2  months to check healing.      Antimicrobials:   None    Subjective: No melena, hematochezia, hematemesis.  No vomiting, diarrhea.  Dizziness this morning, very weak.  This began after he ate his breakfast and was given losartan.  No fever, no leukocytosis.     Objective: Vitals:   08/12/18 0100 08/12/18 0337 08/12/18 0517 08/12/18 1040  BP:  (!) 144/84 134/73 (!) 90/52  Pulse: (!) 52 70 67   Resp:  19 19   Temp:  97.7 F (36.5 C) 98.1 F (36.7 C)   TempSrc:  Oral Oral   SpO2: 100% 100% 97%   Weight:      Height:        Intake/Output Summary (Last 24 hours) at 08/12/2018 1059 Last data filed at 08/12/2018 1040 Gross per 24 hour  Intake 1600 ml  Output 375 ml  Net 1225 ml   Filed Weights   08/10/18 1438 08/10/18 1854  Weight: (!) 163.3 kg (!) 158.3 kg    Examination: General appearance: B's adult male, appears pale, lying in bed, appears weak. HEENT: Anicteric, conjunctival pale, lids and lashes normal.  No nasal deformity, discharge, or epistaxis.  Lips moist, dentition normal, oropharynx moist, no oral lesions, hearing normal. Skin: Pale, warm and dry, no suspicious rashes or lesions.. Cardiac: Heart rate slow, regular, no murmurs, JVP not visible, no lower extremity edema. Respiratory: Normal respiratory rate and rhythm, lungs clear without rales or wheezes. Abdomen: Abdomen soft, no tenderness to palpation or guarding, no ascites, distention, or hepatosplenomegaly. MSK: No deformities or effusions. Neuro: Awake and alert, extraocular movements intact, moves all extremities with normal strength and coordination, speech fluent.    Psych: Sensorium intact and responding to questions, attention normal, affect blunted, judgment insight appear normal.    Data Reviewed: I have personally reviewed following labs and imaging studies:  CBC: Recent Labs  Lab 08/10/18 1453 08/10/18 2019 08/11/18 0321 08/11/18 1048 08/11/18 2304 08/12/18 0312  WBC 14.2*  --   11.6*  --   --  9.3  NEUTROABS 9.1*  --   --   --   --   --   HGB 9.0* 9.3* 8.8* 8.2* 8.2* 8.2*  HCT 29.4* 30.1* 29.1* 27.2* 27.4* 27.1*  MCV 95.1  --  96.7  --   --  96.8  PLT 391  --  299  --   --  026   Basic Metabolic Panel: Recent Labs  Lab 08/10/18 1453 08/11/18 0321 08/12/18 0312  NA 142 143 142  K 3.8 4.0 3.7  CL 108 110 109  CO2 23 25 25   GLUCOSE 150* 122* 113*  BUN 53* 42* 27*  CREATININE 1.31* 1.32* 1.31*  CALCIUM 8.9 8.8* 8.7*   GFR: Estimated Creatinine Clearance: 105 mL/min (A) (by C-G formula based on SCr of 1.31 mg/dL (H)). Liver Function Tests: Recent Labs  Lab 08/10/18 1453  AST 20  ALT 14  ALKPHOS 54  BILITOT 1.0  PROT 6.7  ALBUMIN 3.6   No results for input(s): LIPASE, AMYLASE in the last 168 hours. No results for  input(s): AMMONIA in the last 168 hours. Coagulation Profile: No results for input(s): INR, PROTIME in the last 168 hours. Cardiac Enzymes: No results for input(s): CKTOTAL, CKMB, CKMBINDEX, TROPONINI in the last 168 hours. BNP (last 3 results) No results for input(s): PROBNP in the last 8760 hours. HbA1C: Recent Labs    08/11/18 0321  HGBA1C 5.6   CBG: No results for input(s): GLUCAP in the last 168 hours. Lipid Profile: No results for input(s): CHOL, HDL, LDLCALC, TRIG, CHOLHDL, LDLDIRECT in the last 72 hours. Thyroid Function Tests: No results for input(s): TSH, T4TOTAL, FREET4, T3FREE, THYROIDAB in the last 72 hours. Anemia Panel: No results for input(s): VITAMINB12, FOLATE, FERRITIN, TIBC, IRON, RETICCTPCT in the last 72 hours. Urine analysis: No results found for: COLORURINE, APPEARANCEUR, LABSPEC, PHURINE, GLUCOSEU, HGBUR, BILIRUBINUR, KETONESUR, PROTEINUR, UROBILINOGEN, NITRITE, LEUKOCYTESUR Sepsis Labs: @LABRCNTIP (procalcitonin:4,lacticacidven:4)  ) Recent Results (from the past 240 hour(s))  MRSA PCR Screening     Status: None   Collection Time: 08/11/18  6:26 AM  Result Value Ref Range Status   MRSA by PCR  NEGATIVE NEGATIVE Final    Comment:        The GeneXpert MRSA Assay (FDA approved for NASAL specimens only), is one component of a comprehensive MRSA colonization surveillance program. It is not intended to diagnose MRSA infection nor to guide or monitor treatment for MRSA infections. Performed at [facility] [place], [place] [address], [place], [place] [address]          Radiology Studies: No results found.      Scheduled Meds: . allopurinol  300 mg Oral Daily  . carvedilol  25 mg Oral BID WC  . pantoprazole  40 mg Oral BID AC   Continuous Infusions: . sodium chloride       LOS: 2 days    Time spent: 25 minutes    Edwin Dada, MD [facility] 08/12/2018, 10:59 AM     Please page through AMION:  [email or web address] If 7PM-7AM, please contact night-coverage

## 2018-08-12 NOTE — Telephone Encounter (Signed)
-----   Message from Troy Sine, MD sent at 08/09/2018  9:46 AM EST ----- Mariann Laster, please notify pt and set up for in-lab CPAP titration.

## 2018-08-12 NOTE — Progress Notes (Signed)
Patient states that he does not wear CPAP at this time. Still waiting to get his second sleep study done.

## 2018-08-12 NOTE — Progress Notes (Signed)
[name] 9:09 AM  Subjective: Patient doing well without signs of further bleeding and his endoscopy was reviewed and his case discussed with my partner Dr. Oletta Lamas and he was taking Aleve with his Xarelto and is tolerating clear liquids and wants something to eat  Objective: Vital signs stable afebrile no acute distress abdomen is soft nontender hemoglobin stable BUN decreased  Assessment: Upper GI bleeding seemingly resolved  Plan: Okay to advance diet hopefully home soon no aspirin or nonsteroidals with blood thinners to new pump inhibitors follow-up with my partner Dr. Oletta Lamas in a few weeks  [facility]  Pager [phone] After 5PM or if no answer call [phone]

## 2018-08-13 LAB — BASIC METABOLIC PANEL
ANION GAP: 8 (ref 5–15)
BUN: 23 mg/dL — ABNORMAL HIGH (ref 6–20)
CO2: 25 mmol/L (ref 22–32)
Calcium: 8.4 mg/dL — ABNORMAL LOW (ref 8.9–10.3)
Chloride: 106 mmol/L (ref 98–111)
Creatinine, Ser: 1.23 mg/dL (ref 0.61–1.24)
GFR calc Af Amer: >60 mL/min (ref >60)
GFR calc non Af Amer: >60 mL/min (ref >60)
Glucose, Bld: 127 mg/dL — ABNORMAL HIGH (ref 70–99)
POTASSIUM: 3.7 mmol/L (ref 3.5–5.1)
Sodium: 139 mmol/L (ref 135–145)

## 2018-08-13 LAB — CBC
HCT: 25.3 % — ABNORMAL LOW (ref 39.0–52.0)
Hemoglobin: 7.7 g/dL — ABNORMAL LOW (ref 13.0–17.0)
MCH: 29.8 pg (ref 26.0–34.0)
MCHC: 30.4 g/dL (ref 30.0–36.0)
MCV: 98.1 fL (ref 80.0–100.0)
NRBC: 0 % (ref 0.0–0.2)
Platelets: 187 10*3/uL (ref 150–400)
RBC: 2.58 MIL/uL — ABNORMAL LOW (ref 4.22–5.81)
RDW: 15.8 % — ABNORMAL HIGH (ref 11.5–15.5)
WBC: 7.5 10*3/uL (ref 4.0–10.5)

## 2018-08-13 LAB — TYPE AND SCREEN
ABO/RH(D): A POS
Antibody Screen: NEGATIVE

## 2018-08-13 LAB — PREPARE RBC (CROSSMATCH)

## 2018-08-13 MED ORDER — CARVEDILOL 12.5 MG PO TABS: 12.5000 mg | ORAL_TABLET | Freq: Two times a day (BID) | ORAL | Status: DC

## 2018-08-13 MED ORDER — FUROSEMIDE 40 MG PO TABS
40.0000 mg | ORAL_TABLET | Freq: Once | ORAL | Status: DC
  Filled 2018-08-13: qty 1

## 2018-08-13 MED ORDER — SODIUM CHLORIDE 0.9 % IV BOLUS
500.0000 mL | Freq: Once | INTRAVENOUS | Status: AC
  Administered 2018-08-13: 500 mL via INTRAVENOUS

## 2018-08-13 MED ORDER — SODIUM CHLORIDE 0.9% IV SOLUTION: Freq: Once | INTRAVENOUS | Status: DC

## 2018-08-13 MED ORDER — CARVEDILOL 12.5 MG PO TABS
12.5000 mg | ORAL_TABLET | Freq: Two times a day (BID) | ORAL | Status: DC
  Administered 2018-08-14: 12.5 mg via ORAL
  Filled 2018-08-13: qty 1

## 2018-08-13 NOTE — Progress Notes (Signed)
Pt refused CPAP qhs.  Pt states he is waiting on his second sleep study.  Pt encouraged to contact RT should he change his mind.

## 2018-08-13 NOTE — Progress Notes (Signed)
PROGRESS NOTE    Joe Blake  JIR:[id number] DOB: 1966/08/30 DOA: 08/10/2018 PCP: Charlott Rakes, MD      Brief Narrative:  Joe Blake is a 52 y.o. M with sCHF EF 30% in Sep 2019, Afib on Xarelto, HTN, and MO who presents with hematemesis.  Pt was in [place] until 2 days before admission when he awoke with nausea, went to the bathroom and vomited a large amount of coffee-ground emesis and had a large volume black diarrhea.  Over next two days, felt dizzy with standing, but no more hematemesis or melena.  Finally family convinced him to come to ER.  In ER, Hgb was 9 g/dL down from baseline 13 and rectal exam notable for melena.  Also in [place] with RVR.  Started on IV protonix and transfused 1 unit.         Assessment & Plan:  Bleeding peptic ulcer Upper GI bleed Patient presented with dizziness, new acute blood loss anemia.  EGD 12/8 showed nonbleeding 2 mm gastric ulcer, no report of clot.   No further melena or vomiting since admission.  -Hold Xarelto until at least 12/14 per GI -Continue PPI BID -Advance diet to full   Hypotension Patient with severe dizziness and hypotension yesterday.  This was attributed to losartan and carvedilol restart.  His losartan was held, carvedilol held, but he is persistently weak and dizzy, and I now suspect this is symptomatic anemia. -Hold carvedilol today, restart tomorrow morning -Transfuse 1 unit blood   Acute blood loss anemia  From UGIB.  Baseline Hgb 12-13 g/dL.   Hgb 9 on admission, transfused 1 unit, remained essentially stable since admission (some dilution I believe).  Today, 7.7 g/dL, and I believe symptomatic.   -Repeat transfusion with PO lasix -Continue trend Hgb  Atrial fibrillation with rapid ventricular rate CHADS2Vasc 2 (HTN, CHF). RVR resolved with home carvedilol. -Hold Xarelto -Restart carvedilol tomorrow  Chronic systolic CHF Hypertension  EF 30-35% and globally weak on Echo Sep 2019.  Don't see ischemic work  up.  States he takes Furosemide "as needed" for indents from his socks or belt too tight, about 2x per week.  Does not weigh daily anymore.  -Restart carvedilol tomorrow -Take Lasix PRN at home.  Dose once tonight with tranfusion  Diabetes, ruled out There was a chart history of DM.  Not on meds and A1c 5.6%.  Fasting glucose this AM 120, pre-diabetic.  Gout  Not active -Continue allopurinol  Suspected OSA  AHI 53 on recent sleep study. -CPAP at night        MDM and disposition: The below labs and imaging reports reviewed and summarized above.  Medication management as above.  The patient was admitted with UGIB and acute blood loss anemia as well as Afib with RVR.  His A. fib resolved with restarting his home carvedilol.  He had no further bleeding, and his diet was advanced.  Unfortunately he became hemodynamically unstable with restarting his home blood pressure medicines.  Today he remains significantly weak and fatigued with ambulation, and weak/fatigued/dizzy with standing up, and so I will repeat transfusion.  If blood pressure improves, and symptoms resolved with repeat transfusion, likely home tomorrow.    DVT prophylaxis: SCDs Code Status: FULL Family Communication: None present    Consultants:   GI  Procedures:   EGD Findings: Two linear esophageal ulcers with no bleeding were found at the gastroesophageal junction. Localized severe inflammation characterized by congestion (edema), erosions, linear erosions and deep ulcerations  was found in the gastric antrum. Biopsies were taken with a cold forceps for histology. One non-bleeding cratered gastric ulcer was found in the gastric antrum. The lesion was 2 mm in largest dimension. The examined duodenum was normal. - Medium-sized hiatal hernia. - Non-bleeding esophageal ulcers. - Gastritis. Biopsied. - Non-bleeding gastric ulcer. This is probably the source of his bleeding exacerbated by Xarelto -  Normal examined duodenum. Recommendations - Return patient to hospital ward for ongoing care. - Use Protonix (pantoprazole) 40 mg PO BID. - Return to endoscopist in 6 weeks. - Repeat upper endoscopy in 2 months to check healing.      Antimicrobials:   None    Subjective: No more melena, hematochezia, vomiting, hematemesis.  No diarrhea.  Still very weak today, unable to ambulate as far as the nurses station.  No fever, leukocytosis.  No orthopnea, leg swelling.        Objective: Vitals:   08/12/18 2124 08/12/18 2350 08/13/18 0606 08/13/18 1419  BP: (!) 98/58 108/62 (!) 98/55 111/68  Pulse: 70 71 67 70  Resp: 16  17   Temp: 98 F (36.7 C)  98.5 F (36.9 C) 98.3 F (36.8 C)  TempSrc:    Oral  SpO2: 99% 100% 96% 100%  Weight:      Height:        Intake/Output Summary (Last 24 hours) at 08/13/2018 1608 Last data filed at 08/12/2018 2330 Gross per 24 hour  Intake 240 ml  Output -  Net 240 ml   Filed Weights   08/10/18 1438 08/10/18 1854  Weight: (!) 163.3 kg (!) 158.3 kg    Examination: General appearance: Obese adult male, pale, lying in bed, no acute distress, appears very tired. HEENT: Anicteric, conjunctival pale, lids and lashes normal.  No nasal deformity, discharge, or epistaxis.  Lips moist, dentition normal, oropharynx moist, no oral lesions, hearing normal. Skin: Warm and dry, no suspicious rashes or lesions. Cardiac: Heart rate slow, regular, no murmurs, JVP not visible, no lower extremity edema. Respiratory: Normal respiratory rate and rhythm, lungs clear without rales or wheezes. Abdomen: Abdomen soft without tenderness to palpation or guarding, no ascites, distention, hepatosplenomegaly. MSK: No deformities or effusions. Neuro: Awake and alert, extraocular movements intact, moves all extremities with normal strength and coordination, speech fluent. Psych: Sensorium intact and responding to questions, attention normal, affect blunted, judgment insight  appeared normal.    Data Reviewed: I have personally reviewed following labs and imaging studies:  CBC: Recent Labs  Lab 08/10/18 1453  08/11/18 0321 08/11/18 1048 08/11/18 2304 08/12/18 0312 08/13/18 0620  WBC 14.2*  --  11.6*  --   --  9.3 7.5  NEUTROABS 9.1*  --   --   --   --   --   --   HGB 9.0*   < > 8.8* 8.2* 8.2* 8.2* 7.7*  HCT 29.4*   < > 29.1* 27.2* 27.4* 27.1* 25.3*  MCV 95.1  --  96.7  --   --  96.8 98.1  PLT 391  --  299  --   --  274 187   < > = values in this interval not displayed.   Basic Metabolic Panel: Recent Labs  Lab 08/10/18 1453 08/11/18 0321 08/12/18 0312 08/13/18 0620  NA 142 143 142 139  K 3.8 4.0 3.7 3.7  CL 108 110 109 106  CO2 23 25 25 25   GLUCOSE 150* 122* 113* 127*  BUN 53* 42* 27* 23*  CREATININE 1.31*  1.32* 1.31* 1.23  CALCIUM 8.9 8.8* 8.7* 8.4*   GFR: Estimated Creatinine Clearance: 111.9 mL/min (by C-G formula based on SCr of 1.23 mg/dL). Liver Function Tests: Recent Labs  Lab 08/10/18 1453  AST 20  ALT 14  ALKPHOS 54  BILITOT 1.0  PROT 6.7  ALBUMIN 3.6   No results for input(s): LIPASE, AMYLASE in the last 168 hours. No results for input(s): AMMONIA in the last 168 hours. Coagulation Profile: No results for input(s): INR, PROTIME in the last 168 hours. Cardiac Enzymes: No results for input(s): CKTOTAL, CKMB, CKMBINDEX, TROPONINI in the last 168 hours. BNP (last 3 results) No results for input(s): PROBNP in the last 8760 hours. HbA1C: Recent Labs    08/11/18 0321  HGBA1C 5.6   CBG: No results for input(s): GLUCAP in the last 168 hours. Lipid Profile: No results for input(s): CHOL, HDL, LDLCALC, TRIG, CHOLHDL, LDLDIRECT in the last 72 hours. Thyroid Function Tests: No results for input(s): TSH, T4TOTAL, FREET4, T3FREE, THYROIDAB in the last 72 hours. Anemia Panel: No results for input(s): VITAMINB12, FOLATE, FERRITIN, TIBC, IRON, RETICCTPCT in the last 72 hours. Urine analysis: No results found for:  COLORURINE, APPEARANCEUR, LABSPEC, PHURINE, GLUCOSEU, HGBUR, BILIRUBINUR, KETONESUR, PROTEINUR, UROBILINOGEN, NITRITE, LEUKOCYTESUR Sepsis Labs: @LABRCNTIP (procalcitonin:4,lacticacidven:4)  ) Recent Results (from the past 240 hour(s))  MRSA PCR Screening     Status: None   Collection Time: 08/11/18  6:26 AM  Result Value Ref Range Status   MRSA by PCR NEGATIVE NEGATIVE Final    Comment:        The GeneXpert MRSA Assay (FDA approved for NASAL specimens only), is one component of a comprehensive MRSA colonization surveillance program. It is not intended to diagnose MRSA infection nor to guide or monitor treatment for MRSA infections. Performed at [facility], [place] [address] [place], [place] [address]          Radiology Studies: No results found.      Scheduled Meds: . allopurinol  300 mg Oral Daily  . carvedilol  12.5 mg Oral BID WC  . pantoprazole  40 mg Oral BID AC   Continuous Infusions:    LOS: 3 days    Time spent: 25 minutes    Edwin Dada, MD [facility] 08/13/2018, 4:08 PM     Please page through AMION:  [email or web address] Password [email or web address] If 7PM-7AM, please contact night-coverage

## 2018-08-13 NOTE — Progress Notes (Signed)
Cardiac monitoring called said patient had a 2.1 sec. Pause. MD notified.

## 2018-08-14 ENCOUNTER — Ambulatory Visit: Payer: Self-pay | Admitting: Cardiology

## 2018-08-14 LAB — CBC
HCT: 25.7 % — ABNORMAL LOW (ref 39.0–52.0)
Hemoglobin: 7.8 g/dL — ABNORMAL LOW (ref 13.0–17.0)
MCH: 29.5 pg (ref 26.0–34.0)
MCHC: 30.4 g/dL (ref 30.0–36.0)
MCV: 97.3 fL (ref 80.0–100.0)
Platelets: 202 10*3/uL (ref 150–400)
RBC: 2.64 MIL/uL — ABNORMAL LOW (ref 4.22–5.81)
RDW: 15.8 % — ABNORMAL HIGH (ref 11.5–15.5)
WBC: 7.9 10*3/uL (ref 4.0–10.5)
nRBC: 0 % (ref 0.0–0.2)

## 2018-08-14 LAB — BPAM RBC
BLOOD PRODUCT EXPIRATION DATE: 201912292359
BLOOD PRODUCT EXPIRATION DATE: 201912292359
Blood Product Expiration Date: 201912282359
ISSUE DATE / TIME: 201912071556
ISSUE DATE / TIME: 201912102155
ISSUE DATE / TIME: 201912110654
UNIT TYPE AND RH: 6200
Unit Type and Rh: 6200
Unit Type and Rh: 6200

## 2018-08-14 LAB — TYPE AND SCREEN
ABO/RH(D): A POS
Antibody Screen: NEGATIVE
Unit division: 0
Unit division: 0
Unit division: 0

## 2018-08-14 LAB — BASIC METABOLIC PANEL
Anion gap: 7 (ref 5–15)
BUN: 21 mg/dL — ABNORMAL HIGH (ref 6–20)
CALCIUM: 8.6 mg/dL — AB (ref 8.9–10.3)
CO2: 26 mmol/L (ref 22–32)
Chloride: 107 mmol/L (ref 98–111)
Creatinine, Ser: 1.15 mg/dL (ref 0.61–1.24)
GFR calc Af Amer: >60 mL/min (ref >60)
GLUCOSE: 121 mg/dL — AB (ref 70–99)
Potassium: 3.8 mmol/L (ref 3.5–5.1)
Sodium: 140 mmol/L (ref 135–145)

## 2018-08-14 MED ORDER — CARVEDILOL 25 MG PO TABS
25.0000 mg | ORAL_TABLET | Freq: Two times a day (BID) | ORAL | Status: DC
  Administered 2018-08-14 – 2018-08-15 (×2): 25 mg via ORAL
  Filled 2018-08-14 (×2): qty 1

## 2018-08-14 NOTE — Progress Notes (Signed)
PROGRESS NOTE    NOLAN TUAZON  NTI:[id number] DOB: 1965/11/11 DOA: 08/10/2018 PCP: Charlott Rakes, MD      Brief Narrative:  Joe Blake is a 52 y.o. M with sCHF EF 30% in Sep 2019, Afib on Xarelto, HTN, and MO who presents with hematemesis.  Pt was in [place] until 2 days before admission when he awoke with nausea, went to the bathroom and vomited a large amount of coffee-ground emesis and had a large volume black diarrhea.  Over next two days, felt dizzy with standing, but no more hematemesis or melena.  Finally family convinced him to come to ER.  In ER, Hgb was 9 g/dL down from baseline 13 and rectal exam notable for melena.  Also in [place] with RVR.  Started on IV protonix and transfused 1 unit.   Assessment & Plan:  Bleeding peptic ulcer Upper GI bleed Patient presented with dizziness, new acute blood loss anemia.  EGD 12/8 showed nonbleeding 2 mm gastric ulcer, no report of clot.   No further melena or vomiting since admission.  -Hold Xarelto until at least 12/14 per GI -Continue PPI BID -Advance diet to full 08/14/18: - Hb stable at this time. - AM labs ordered. - Continue to monitor and transfuse as needed.  Hypotension Patient with severe dizziness and hypotension yesterday.  This was attributed to losartan and carvedilol restart.  His losartan was held, carvedilol held, but he is persistently weak and dizzy, and I now suspect this is symptomatic anemia. -Transfused 1 unit blood  08/14/18: -Blood pressure stable at this time.  He is still a little dizzy but improving. -We will increase the dose of Coreg today back to his home dose and monitor.  Acute blood loss anemia  From UGIB.  Baseline Hgb 12-13 g/dL.   Hgb 9 on admission, transfused 1 unit, remained essentially stable since admission (some dilution I believe).  Today, 7.7 g/dL, and I believe symptomatic.   -Repeat transfusion with PO lasix -Continue trend Hgb  08/14/18: - Hb 7.8 today.  A.m. labs  ordered. -Continue to monitor and transfuse as needed.  Atrial fibrillation with rapid ventricular rate CHADS2Vasc 2 (HTN, CHF). RVR resolved with home carvedilol. -Hold Xarelto -Restarted carvedilol 08/14/18: -Blood pressure better at this time but he is tachycardic.  Increased the dose of Coreg back to his home dose. - Continue to monitor.  Chronic systolic CHF Hypertension  EF 30-35% and globally weak on Echo Sep 2019.  Don't see ischemic work up.  States he takes Furosemide "as needed" for indents from his socks or belt too tight, about 2x per week.  Does not weigh daily anymore.  -Restarted carvedilol tomorrow -Take Lasix PRN at home.  Dosed with tranfusion  Diabetes, ruled out There was a chart history of DM.  Not on meds and A1c 5.6%.  pre-diabetic.  Gout  Not active -Continue allopurinol  Suspected OSA  AHI 53 on recent sleep study. -CPAP at night  08/14/18: -Per RT patient refuses CPAP.  He is saying he is awaiting a second sleep study. -He is advised to follow-up outpatient.   MDM and disposition: The below labs and imaging reports reviewed and summarized above.  Medication management as above.  The patient was admitted with UGIB and acute blood loss anemia as well as Afib with RVR.  His A. fib resolved with restarting his home carvedilol.  He had no further bleeding, and his diet was advanced.  Unfortunately he became hemodynamically unstable with restarting his  home blood pressure medicines.  Yesterday he remained significantly weak and fatigued with ambulation, and weak/fatigued/dizzy with standing up, and so repeat transfusion.  He is still having some dizziness but says its improving. If blood pressure improves, and symptoms resolved likely home tomorrow.   DVT prophylaxis: SCDs Code Status:  Full Family Communication: Family at bedside  Consultants:   GI  Procedures:   EGD Findings: Two linear esophageal ulcers with no bleeding were found  at the gastroesophageal junction. Localized severe inflammation characterized by congestion (edema), erosions, linear erosions and deep ulcerations was found in the gastric antrum. Biopsies were taken with a cold forceps for histology. One non-bleeding cratered gastric ulcer was found in the gastric antrum. The lesion was 2 mm in largest dimension. The examined duodenum was normal. - Medium-sized hiatal hernia. - Non-bleeding esophageal ulcers. - Gastritis. Biopsied. - Non-bleeding gastric ulcer. This is probably the source of his bleeding exacerbated by Xarelto - Normal examined duodenum. Recommendations - Return patient to hospital ward for ongoing care. - Use Protonix (pantoprazole) 40 mg PO BID. - Return to endoscopist in 6 weeks. - Repeat upper endoscopy in 2 months to check healing.   Antimicrobials:   None    Subjective: No more melena, hematochezia, vomiting, hematemesis.  No diarrhea.  Still very weak today, unable to ambulate as far as the nurses station.  No fever, leukocytosis.  No orthopnea, leg swelling.        Objective: Vitals:   08/13/18 2232 08/14/18 0129 08/14/18 0430 08/14/18 1414  BP: 131/75 133/73 131/76 (!) 141/81  Pulse: 71 70 69 74  Resp: 16 20 20 18   Temp: 98.1 F (36.7 C) 98.7 F (37.1 C) 98.7 F (37.1 C) 98.1 F (36.7 C)  TempSrc: Oral Oral Oral Oral  SpO2: 100% 100% 100% 99%  Weight:      Height:        Intake/Output Summary (Last 24 hours) at 08/14/2018 1512 Last data filed at 08/13/2018 2232 Gross per 24 hour  Intake 360 ml  Output -  Net 360 ml   Filed Weights   08/10/18 1438 08/10/18 1854  Weight: (!) 163.3 kg (!) 158.3 kg    Examination: General appearance: Obese adult male, pale, lying in bed, no acute distress. HEENT: Anicteric, conjunctival pale, lids and lashes normal.  No nasal deformity, discharge, or epistaxis.  Lips moist, dentition normal, oropharynx moist, no oral lesions, hearing normal. Skin: Warm and dry, no  suspicious rashes or lesions. Cardiac: Heart rate slow, regular, no murmurs, JVP not visible, no lower extremity edema. Respiratory: Normal respiratory rate and rhythm, lungs clear without rales or wheezes. Abdomen: Abdomen soft without tenderness to palpation or guarding, no ascites, distention, hepatosplenomegaly. MSK: No deformities or effusions. Neuro: Awake and alert, extraocular movements intact, moves all extremities with normal strength and coordination, speech fluent. Psych: Sensorium intact and responding to questions, attention normal, affect blunted, judgment insight appeared normal.    Data Reviewed: I have personally reviewed following labs and imaging studies:  CBC: Recent Labs  Lab 08/10/18 1453  08/11/18 0321 08/11/18 1048 08/11/18 2304 08/12/18 0312 08/13/18 0620 08/14/18 0622  WBC 14.2*  --  11.6*  --   --  9.3 7.5 7.9  NEUTROABS 9.1*  --   --   --   --   --   --   --   HGB 9.0*   < > 8.8* 8.2* 8.2* 8.2* 7.7* 7.8*  HCT 29.4*   < > 29.1* 27.2* 27.4* 27.1*  25.3* 25.7*  MCV 95.1  --  96.7  --   --  96.8 98.1 97.3  PLT 391  --  299  --   --  274 187 202   < > = values in this interval not displayed.   Basic Metabolic Panel: Recent Labs  Lab 08/10/18 1453 08/11/18 0321 08/12/18 0312 08/13/18 0620 08/14/18 0622  NA 142 143 142 139 140  K 3.8 4.0 3.7 3.7 3.8  CL 108 110 109 106 107  CO2 23 25 25 25 26   GLUCOSE 150* 122* 113* 127* 121*  BUN 53* 42* 27* 23* 21*  CREATININE 1.31* 1.32* 1.31* 1.23 1.15  CALCIUM 8.9 8.8* 8.7* 8.4* 8.6*   GFR: Estimated Creatinine Clearance: 119.6 mL/min (by C-G formula based on SCr of 1.15 mg/dL). Liver Function Tests: Recent Labs  Lab 08/10/18 1453  AST 20  ALT 14  ALKPHOS 54  BILITOT 1.0  PROT 6.7  ALBUMIN 3.6   No results for input(s): LIPASE, AMYLASE in the last 168 hours. No results for input(s): AMMONIA in the last 168 hours. Coagulation Profile: No results for input(s): INR, PROTIME in the last 168  hours. Cardiac Enzymes: No results for input(s): CKTOTAL, CKMB, CKMBINDEX, TROPONINI in the last 168 hours. BNP (last 3 results) No results for input(s): PROBNP in the last 8760 hours. HbA1C: No results for input(s): HGBA1C in the last 72 hours. CBG: No results for input(s): GLUCAP in the last 168 hours. Lipid Profile: No results for input(s): CHOL, HDL, LDLCALC, TRIG, CHOLHDL, LDLDIRECT in the last 72 hours. Thyroid Function Tests: No results for input(s): TSH, T4TOTAL, FREET4, T3FREE, THYROIDAB in the last 72 hours. Anemia Panel: No results for input(s): VITAMINB12, FOLATE, FERRITIN, TIBC, IRON, RETICCTPCT in the last 72 hours. Urine analysis: No results found for: COLORURINE, APPEARANCEUR, LABSPEC, PHURINE, GLUCOSEU, HGBUR, BILIRUBINUR, KETONESUR, PROTEINUR, UROBILINOGEN, NITRITE, LEUKOCYTESUR Sepsis Labs: @LABRCNTIP (procalcitonin:4,lacticacidven:4)  ) Recent Results (from the past 240 hour(s))  MRSA PCR Screening     Status: None   Collection Time: 08/11/18  6:26 AM  Result Value Ref Range Status   MRSA by PCR NEGATIVE NEGATIVE Final    Comment:        The GeneXpert MRSA Assay (FDA approved for NASAL specimens only), is one component of a comprehensive MRSA colonization surveillance program. It is not intended to diagnose MRSA infection nor to guide or monitor treatment for MRSA infections. Performed at [facility], [place] [address], [place], [place] [address]          Radiology Studies: No results found.      Scheduled Meds: . sodium chloride   Intravenous Once  . allopurinol  300 mg Oral Daily  . carvedilol  12.5 mg Oral BID WC  . furosemide  40 mg Oral Once  . pantoprazole  40 mg Oral BID AC   Continuous Infusions:    LOS: 4 days    Time spent: 25 minutes    Yaakov Guthrie, MD [facility] 08/14/2018, 3:12 PM     Please page through [place]:  [email or web address] Password [email or web address] If 7PM-7AM, please contact  night-coverage

## 2018-08-14 NOTE — Progress Notes (Signed)
Pt continues to refused CPAP qhs.  Pt encouraged to contact RT should he change his mind.

## 2018-08-15 ENCOUNTER — Encounter: Payer: Self-pay | Admitting: *Deleted

## 2018-08-15 DIAGNOSIS — D62 Acute posthemorrhagic anemia: Secondary | ICD-10-CM

## 2018-08-15 DIAGNOSIS — K920 Hematemesis: Secondary | ICD-10-CM

## 2018-08-15 LAB — BASIC METABOLIC PANEL
ANION GAP: 8 (ref 5–15)
BUN: 20 mg/dL (ref 6–20)
CO2: 24 mmol/L (ref 22–32)
Calcium: 8.6 mg/dL — ABNORMAL LOW (ref 8.9–10.3)
Chloride: 108 mmol/L (ref 98–111)
Creatinine, Ser: 1.12 mg/dL (ref 0.61–1.24)
GFR calc Af Amer: >60 mL/min (ref >60)
GFR calc non Af Amer: >60 mL/min (ref >60)
Glucose, Bld: 147 mg/dL — ABNORMAL HIGH (ref 70–99)
Potassium: 3.6 mmol/L (ref 3.5–5.1)
Sodium: 140 mmol/L (ref 135–145)

## 2018-08-15 LAB — CBC
HCT: 25.8 % — ABNORMAL LOW (ref 39.0–52.0)
Hemoglobin: 7.9 g/dL — ABNORMAL LOW (ref 13.0–17.0)
MCH: 29.9 pg (ref 26.0–34.0)
MCHC: 30.6 g/dL (ref 30.0–36.0)
MCV: 97.7 fL (ref 80.0–100.0)
PLATELETS: 191 10*3/uL (ref 150–400)
RBC: 2.64 MIL/uL — ABNORMAL LOW (ref 4.22–5.81)
RDW: 15.2 % (ref 11.5–15.5)
WBC: 7.6 10*3/uL (ref 4.0–10.5)
nRBC: 0 % (ref 0.0–0.2)

## 2018-08-15 MED ORDER — PANTOPRAZOLE SODIUM 40 MG PO TBEC: 40.0000 mg | DELAYED_RELEASE_TABLET | Freq: Two times a day (BID) | ORAL | 1 refills | Status: DC

## 2018-08-15 MED ORDER — POTASSIUM CHLORIDE CRYS ER 20 MEQ PO TBCR
20.0000 meq | EXTENDED_RELEASE_TABLET | Freq: Once | ORAL | Status: AC
  Administered 2018-08-15: 20 meq via ORAL

## 2018-08-15 MED FILL — PANTOPRAZOLE SOD DR 40 MG T: 40 | 30 days supply | Qty: 60 | Fill #0

## 2018-08-15 NOTE — Discharge Summary (Signed)
Physician Discharge Summary  Joe Blake HDQ:[id number] DOB: 05/15/1966 DOA: 08/10/2018  PCP: Charlott Rakes, MD  Admit date: 08/10/2018 Discharge date: 08/15/2018  Admitted From: Home Disposition:  Home  Recommendations for Outpatient Follow-up:  1. Follow up with PCP in 1-2 weeks 2. Please obtain BMP/CBC in one week 3. Please follow up on the following pending results:  Home Health: No Equipment/Devices: None  Discharge Condition:Stable CODE STATUS: Full Diet recommendation: Heart Healthy / Carb Modified  Brief/Interim Summary: Joe Blake a 52 y.o.M with sCHF EF 30% in Sep 2019, Afib on Xarelto, HTN, and MOwho was admitted on 08/10/2018 when he presented with hematemesis.  Pt was in [place] until 2 days before admission when he awoke with nausea, went to the bathroom and vomited a large amount of coffee-ground emesis and had a large volume black diarrhea.  Over next two days, felt dizzy with standing, but no more hematemesis or melena.  Finally family convinced him to come to ER.  In ER, Hgb was 9 g/dL down from baseline 13 and rectal exam notable for melena.  Also in [place] with RVR.  Started on IV protonix and transfused 1 unit.  Following is a problem list:  Bleeding peptic ulcer Upper GI bleed Patient presented with dizziness, new acute blood loss anemia.  EGD 12/8 showed nonbleeding 2 mm gastric ulcer, no report of clot.  No further melena or vomiting since admission. -Hold Xarelto until at least 12/14 per GI or until evaluated by his outpatient physician.  Avoid any NSAIDs. -Continue PPI BID - Hb stable at this time.   Hypotension Patient had severe dizziness and hypotension.  This was attributed to losartan and carvedilol restart in addition to his GIB, anemia.  His losartan was held, carvedilol held. -Transfused 1 unit blood  08/14/18: -Blood pressure stable at this time.  He is still a little dizzy but improving. -We will increase the dose of Coreg today back to  his home dose. 08/15/18: -Blood pressure improved at this time.  His dizziness also resolved. -Cozaar held at this time. -Patient advised to follow-up with his outpatient physician within 1 week to have his blood pressure monitored in the outpatient setting and medications adjusted accordingly.  Acute blood loss anemia From UGIB. Baseline Hgb 12-13 g/dL.  Hgb 9 on admission, transfused 1 unit, remained essentially stable since admission. 08/15/18: - Hb stable at 7.9.  Atrial fibrillation with rapid ventricular rate CHADS2Vasc 2 (HTN, CHF). RVR resolved with home carvedilol. -Hold Xarelto -Restarted carvedilol 08/14/18: -Blood pressure better at this time but he is tachycardic.  Increased the dose of Coreg back to his home dose. 08/15/18: - BP stable at this time on home dose of Coreg. -Xarelto held per gastroenterology. -Patient advised to follow-up with his outpatient physician.  Chronic systolic CHF EF 11-94% and globally weak on Echo Sep 2019. Don't see ischemic work up. States he takes Furosemide "as needed" for indents from his socks or belt too tight, about 2x per week. Does not weigh daily anymore. -Take Lasix PRN at home.  -Discussed with the patient and advised him that it is important that he check his weights to evaluate for weight gain and take the Lasix.  I also emphasized to him to follow-up with his outpatient physician.  Diabetes, ruled out There was a chart history of DM.  Not on meds and A1c 5.6%.  pre-diabetic.  Gout Not active -Continue allopurinol  Suspected OSA AHI 53 on recent sleep study. -CPAP at night -Patient  refuses CPAP.  He is saying he is awaiting a second sleep study. -He is advised to follow-up outpatient.  Patient at this time says he feels better and wanting to be discharged.  Vitals, physical examination at the time of discharge fairly stable.  Discharge Diagnoses:  Principal Problem:   Hematemesis Active Problems:    DM2 (diabetes mellitus, type 2) (HCC)   Essential hypertension   Obesity, Class III, BMI 40-49.9 (morbid obesity) (HCC)   CKD (chronic kidney disease) stage 3, GFR 30-59 ml/min (HCC)   Gout   Atrial flutter (HCC)   Atrial fibrillation with RVR (HCC)   Acute blood loss anemia    Discharge Instructions Discharge plan of care discussed with the patient.  I answered all of his questions appropriately to the best of my knowledge.  He verbalized understanding and is being discharged home in a stable condition.  Allergies as of 08/15/2018   No Known Allergies     Medication List    STOP taking these medications   furosemide 40 MG tablet Commonly known as:  LASIX   losartan 50 MG tablet Commonly known as:  COZAAR   naproxen sodium 220 MG tablet Commonly known as:  ALEVE   rivaroxaban 20 MG Tabs tablet Commonly known as:  XARELTO     TAKE these medications   allopurinol 300 MG tablet Commonly known as:  ZYLOPRIM Take 1 tablet (300 mg total) by mouth daily.   carvedilol 25 MG tablet Commonly known as:  COREG Take 1 tablet (25 mg total) by mouth 2 (two) times daily with a meal.   colchicine 0.6 MG tablet Take 1 tablet (0.6 mg total) by mouth once as needed (take 1.2mg  and then 0.6mg  1 hour later if still having symptoms).   pantoprazole 40 MG tablet Commonly known as:  PROTONIX Take 1 tablet (40 mg total) by mouth 2 (two) times daily before a meal.       No Known Allergies  Consultations:  Gastroenterology   Procedures/Studies: EGD   Subjective: Patient previously had dizziness but says this has resolved.  He denies having any major complaints at this time.  Discharge Exam: Vitals:   08/14/18 2133 08/15/18 0526  BP: 118/70 120/67  Pulse: 69 68  Resp: 19 17  Temp: 98.1 F (36.7 C) (!) 97.4 F (36.3 C)  SpO2: 100% 98%   Vitals:   08/14/18 0430 08/14/18 1414 08/14/18 2133 08/15/18 0526  BP: 131/76 (!) 141/81 118/70 120/67  Pulse: 69 74 69 68  Resp:  20 18 19 17   Temp: 98.7 F (37.1 C) 98.1 F (36.7 C) 98.1 F (36.7 C) (!) 97.4 F (36.3 C)  TempSrc: Oral Oral    SpO2: 100% 99% 100% 98%  Weight:      Height:        General: Pt is alert, awake, not in acute distress Cardiovascular: RRR, S1/S2 +, no rubs, no gallops Respiratory: CTA bilaterally, no wheezing, no rhonchi Abdominal: Soft, NT, ND, bowel sounds + Extremities: no edema, no cyanosis    The results of significant diagnostics from this hospitalization (including imaging, microbiology, ancillary and laboratory) are listed below for reference.     Microbiology: Recent Results (from the past 240 hour(s))  MRSA PCR Screening     Status: None   Collection Time: 08/11/18  6:26 AM  Result Value Ref Range Status   MRSA by PCR NEGATIVE NEGATIVE Final    Comment:        The GeneXpert MRSA Assay (  FDA approved for NASAL specimens only), is one component of a comprehensive MRSA colonization surveillance program. It is not intended to diagnose MRSA infection nor to guide or monitor treatment for MRSA infections. Performed at [facility], [place] [address] [place], [place] [address]      Labs: BNP (last 3 results) Recent Labs    11/19/17 2010  BNP 878.6*   Basic Metabolic Panel: Recent Labs  Lab 08/11/18 0321 08/12/18 0312 08/13/18 0620 08/14/18 0622 08/15/18 0620  NA 143 142 139 140 140  K 4.0 3.7 3.7 3.8 3.6  CL 110 109 106 107 108  CO2 25 25 25 26 24   GLUCOSE 122* 113* 127* 121* 147*  BUN 42* 27* 23* 21* 20  CREATININE 1.32* 1.31* 1.23 1.15 1.12  CALCIUM 8.8* 8.7* 8.4* 8.6* 8.6*   Liver Function Tests: Recent Labs  Lab 08/10/18 1453  AST 20  ALT 14  ALKPHOS 54  BILITOT 1.0  PROT 6.7  ALBUMIN 3.6   No results for input(s): LIPASE, AMYLASE in the last 168 hours. No results for input(s): AMMONIA in the last 168 hours. CBC: Recent Labs  Lab 08/10/18 1453  08/11/18 0321  08/11/18 2304 08/12/18 0312 08/13/18 0620  08/14/18 0622 08/15/18 0620  WBC 14.2*  --  11.6*  --   --  9.3 7.5 7.9 7.6  NEUTROABS 9.1*  --   --   --   --   --   --   --   --   HGB 9.0*   < > 8.8*   < > 8.2* 8.2* 7.7* 7.8* 7.9*  HCT 29.4*   < > 29.1*   < > 27.4* 27.1* 25.3* 25.7* 25.8*  MCV 95.1  --  96.7  --   --  96.8 98.1 97.3 97.7  PLT 391  --  299  --   --  274 187 202 191   < > = values in this interval not displayed.   Cardiac Enzymes: No results for input(s): CKTOTAL, CKMB, CKMBINDEX, TROPONINI in the last 168 hours. BNP: Invalid input(s): POCBNP CBG: No results for input(s): GLUCAP in the last 168 hours. D-Dimer No results for input(s): DDIMER in the last 72 hours. Hgb A1c No results for input(s): HGBA1C in the last 72 hours. Lipid Profile No results for input(s): CHOL, HDL, LDLCALC, TRIG, CHOLHDL, LDLDIRECT in the last 72 hours. Thyroid function studies No results for input(s): TSH, T4TOTAL, T3FREE, THYROIDAB in the last 72 hours.  Invalid input(s): FREET3 Anemia work up No results for input(s): VITAMINB12, FOLATE, FERRITIN, TIBC, IRON, RETICCTPCT in the last 72 hours. Urinalysis No results found for: COLORURINE, APPEARANCEUR, [place], [place], [place], [place], [place], [place], PROTEINUR, UROBILINOGEN, NITRITE, LEUKOCYTESUR Sepsis Labs Invalid input(s): PROCALCITONIN,  WBC,  LACTICIDVEN Microbiology Recent Results (from the past 240 hour(s))  MRSA PCR Screening     Status: None   Collection Time: 08/11/18  6:26 AM  Result Value Ref Range Status   MRSA by PCR NEGATIVE NEGATIVE Final    Comment:        The GeneXpert MRSA Assay (FDA approved for NASAL specimens only), is one component of a comprehensive MRSA colonization surveillance program. It is not intended to diagnose MRSA infection nor to guide or monitor treatment for MRSA infections. Performed at [facility], [place] [address] [place],  [address]      Time coordinating discharge: Over 30  minutes  SIGNED:   Beverely Pace, MD  [facility] 08/15/2018, 11:12 AM Pager  If 7PM-7AM, please contact night-coverage [email or web address] Password [email or web address]

## 2018-08-15 NOTE — Care Management Note (Signed)
Case Management Note  Patient Details  Name: Joe Blake MRN: [id number] Date of Birth: February 05, 1966  Subjective/Objective:                  discharged  Action/Plan: Discharged to home with self-care, orders checked for hhc needs. No CM needs present at time of discharge.  Patient is able to arrangement own appointments and home care.  Expected Discharge Date:  08/15/18               Expected Discharge Plan:  Home/Self Care  In-House Referral:     Discharge planning Services  CM Consult  Post Acute Care Choice:    Choice offered to:     DME Arranged:    DME Agency:     HH Arranged:    HH Agency:     Status of Service:  Completed, signed off  If discussed at [employer] of Stay Meetings, dates discussed:    Additional Comments:  Leeroy Cha, RN 08/15/2018, 11:12 AM

## 2018-08-16 ENCOUNTER — Telehealth: Payer: Self-pay

## 2018-08-16 ENCOUNTER — Other Ambulatory Visit: Payer: Self-pay | Admitting: Family Medicine

## 2018-08-16 MED ORDER — PREDNISONE 20 MG PO TABS: 20.0000 mg | ORAL_TABLET | Freq: Two times a day (BID) | ORAL | 0 refills | Status: DC

## 2018-08-16 MED FILL — predniSONE 20 MG TABS: 20 | 5 days supply | Qty: 10 | Fill #0

## 2018-08-16 NOTE — Telephone Encounter (Signed)
Call placed to patient and informed him that Dr Margarita Rana sent a prescription for prednisone to his pharmacy for his gout and she does not recommend taking NSAIDS.  He stated that he understood

## 2018-08-16 NOTE — Telephone Encounter (Signed)
Transition Care Management Follow-up Telephone Call  Date of discharge and from where: 08/15/2018, [facility]  How have you been since you were released from the hospital? He stated that he has just been feeling sleepy. He also reported pain in feet from gout. He said that the pain usually lasts 1-2 days but he is not sure how he will manage the pain without the NSAIDS that he would usually take.   Any questions or concerns? He stated that the only question he has is one that the doctors have not been able to answer for him - " what caused the ulcer?"  Items Reviewed:  Did the pt receive and understand the discharge instructions provided? Yes and he said that he did not have any questions  Medications obtained and verified? No, he does not have the pantoprazole but has his other medications.  He said that he can't get to the clinic to pick up the pantoprazole and will get it at his appointment on 12/161/9. He explained that his feet are too painful to drive.  He was able to state the medications that he is to stop taking - furosemide, losartan, aleve and xarelto. He said that he did not have any questions about his medications.   Any new allergies since your discharge? None reported  Do you have support at home?has cousin visiting.   Other (ie: DME, Home Health, etc) none ordered  Functional Questionnaire: (I = Independent and D = Dependent) ADL's: independent   Follow up appointments reviewed:    PCP Hospital f/u appt confirmed? Yes, 08/19/18 @ 1050 with Dr Margarita Rana.  [facility] f/u appt confirmed? He said that he missed his cardiology appointment when he was in the hospital and he will need to reschedule.  CPAP titration scheduled for 09/11/2018  Are transportation arrangements needed? He said that he plans to drive.   If their condition worsens, is the pt aware to call  their PCP or go to the ED?yes  Was the patient provided with contact information for the PCP's  office or ED? Has phone #  Was the pt encouraged to call back with questions or concerns? yes

## 2018-08-19 ENCOUNTER — Ambulatory Visit: Payer: Medicaid Other | Attending: Family Medicine | Admitting: Family Medicine

## 2018-08-19 ENCOUNTER — Other Ambulatory Visit: Payer: Self-pay | Admitting: Cardiology

## 2018-08-19 ENCOUNTER — Encounter: Payer: Self-pay | Admitting: Family Medicine

## 2018-08-19 VITALS — BP 116/73 | HR 66 | Ht 73.0 in | Wt 362.2 lb

## 2018-08-19 DIAGNOSIS — R7303 Prediabetes: Secondary | ICD-10-CM

## 2018-08-19 DIAGNOSIS — Z7901 Long term (current) use of anticoagulants: Secondary | ICD-10-CM | POA: Insufficient documentation

## 2018-08-19 DIAGNOSIS — I4892 Unspecified atrial flutter: Secondary | ICD-10-CM | POA: Diagnosis not present

## 2018-08-19 DIAGNOSIS — K250 Acute gastric ulcer with hemorrhage: Secondary | ICD-10-CM | POA: Insufficient documentation

## 2018-08-19 DIAGNOSIS — I110 Hypertensive heart disease with heart failure: Secondary | ICD-10-CM | POA: Diagnosis not present

## 2018-08-19 DIAGNOSIS — I4891 Unspecified atrial fibrillation: Secondary | ICD-10-CM | POA: Diagnosis not present

## 2018-08-19 DIAGNOSIS — D649 Anemia, unspecified: Secondary | ICD-10-CM | POA: Diagnosis not present

## 2018-08-19 DIAGNOSIS — M1A0790 Idiopathic chronic gout, unspecified ankle and foot, without tophus (tophi): Secondary | ICD-10-CM | POA: Insufficient documentation

## 2018-08-19 DIAGNOSIS — Z6841 Body Mass Index (BMI) 40.0 and over, adult: Secondary | ICD-10-CM | POA: Diagnosis not present

## 2018-08-19 DIAGNOSIS — Z79899 Other long term (current) drug therapy: Secondary | ICD-10-CM | POA: Diagnosis not present

## 2018-08-19 DIAGNOSIS — E669 Obesity, unspecified: Secondary | ICD-10-CM

## 2018-08-19 DIAGNOSIS — K922 Gastrointestinal hemorrhage, unspecified: Secondary | ICD-10-CM | POA: Diagnosis not present

## 2018-08-19 DIAGNOSIS — I509 Heart failure, unspecified: Secondary | ICD-10-CM | POA: Diagnosis not present

## 2018-08-19 NOTE — Progress Notes (Signed)
[place]  Date of Telephone Encounter: 08/16/2018  Date of 1st service: 08/19/2018   Admit Date: 08/10/2018 Discharge Date: 08/15/2018     Subjective:  Patient ID: Joe Blake, male    DOB: 1965/11/22  Age: 53 y.o. MRN: [id number]  CC: Hypotension   HPI Joe Blake  is a 52 year old male with history of Prediabetes (A1c 5.6) hypertension, gout, congestive heart failure (EF 30-35%), atrial fibrillation with RVR/atrial flutter status post DCCV on 03/2018 (currently on anticoagulation with Xarelto) here for follow-up visit. He had a recent hospitalization at [facility] for upper GI bleed after he had presented with hematemesis and was found to have hemoglobin of 7.8 on presentation. EGD revealed antral hernia, nonbleeding esophageal ulcer, gastritis, nonbleeding cratered ulcer; Helicobacter pylori antibody was negative.  He was placed on Protonix and repeat upper endoscopy recommended in 2 months. He received 2 units PRBC discharge hemoglobin was 7.9.  Denies hematemesis or hematochezia or dark stools since discharge, denies abdominal pain.  He is dyspneic but this is baseline for him. He just complains of feeling hot as he complains the waiting room was too hot in the examination room is too hot as well.  He also endorses being under a lot of stress at home. A couple of days he suffered acute gout flare and used his colchicine; prednisone was also called into his pharmacy which she just picked up today.  He reports resolution flare.  Past Medical History:  Diagnosis Date  . Acute systolic HF (heart failure) ([place])   . Atrial fibrillation ([place])   . Diabetes mellitus without complication ([place])   . Hypertension   . Morbid obesity ([place])     Past Surgical History:  Procedure Laterality Date  . ABDOMINAL SURGERY    . APPENDECTOMY     at 52 years old  . BIOPSY  08/11/2018   Procedure: BIOPSY;  Surgeon: Laurence Spates, MD;  Location: [facility] ENDOSCOPY;  Service:  Endoscopy;;  . CARDIOVERSION N/A 03/18/2018   Procedure: CARDIOVERSION;  Surgeon: Skeet Latch, MD;  Location: [facility] ENDOSCOPY;  Service: Cardiovascular;  Laterality: N/A;  . ESOPHAGOGASTRODUODENOSCOPY (EGD) WITH PROPOFOL N/A 08/11/2018   Procedure: ESOPHAGOGASTRODUODENOSCOPY (EGD) WITH PROPOFOL;  Surgeon: Laurence Spates, MD;  Location: [facility] ENDOSCOPY;  Service: Endoscopy;  Laterality: N/A;     Outpatient Medications Prior to Visit  Medication Sig Dispense Refill  . allopurinol (ZYLOPRIM) 300 MG tablet Take 1 tablet (300 mg total) by mouth daily. 30 tablet 6  . carvedilol (COREG) 25 MG tablet Take 1 tablet (25 mg total) by mouth 2 (two) times daily with a meal. 180 tablet 3  . colchicine 0.6 MG tablet Take 1 tablet (0.6 mg total) by mouth once as needed (take 1.2mg  and then 0.6mg  1 hour later if still having symptoms). 30 tablet 1  . pantoprazole (PROTONIX) 40 MG tablet Take 1 tablet (40 mg total) by mouth 2 (two) times daily before a meal. 60 tablet 1  . predniSONE (DELTASONE) 20 MG tablet Take 1 tablet (20 mg total) by mouth 2 (two) times daily with a meal. (Patient not taking: Reported on 08/19/2018) 10 tablet 0   No facility-administered medications prior to visit.     ROS Review of Systems  Constitutional: Negative for activity change and appetite change.  HENT: Negative for sinus pressure and sore throat.   Eyes: Negative for visual disturbance.  Respiratory: Positive for shortness of breath (at baseline). Negative for cough and chest tightness.   Cardiovascular: Negative  for chest pain and leg swelling.  Gastrointestinal: Negative for abdominal distention, abdominal pain, constipation and diarrhea.  Endocrine: Negative.   Genitourinary: Negative for dysuria.  Musculoskeletal: Negative for joint swelling and myalgias.  Skin: Negative for rash.  Allergic/Immunologic: Negative.   Neurological: Negative for weakness, light-headedness and numbness.  Psychiatric/Behavioral: Negative  for dysphoric mood and suicidal ideas.    Objective:  BP 116/73   Pulse 66   Ht 6\' 1"  (1.854 m)   Wt (!) 362 lb 3.2 oz (164.3 kg)   SpO2 96%   BMI 47.79 kg/m   BP/Weight 08/19/2018 08/15/2018 62/05/4764  Systolic BP 465 035 -  Diastolic BP 73 67 -  Wt. (Lbs) 362.2 - 348.99  BMI 47.79 - 46.04      Physical Exam Constitutional:      Appearance: He is well-developed.  Neck:     Vascular: No JVD.  Cardiovascular:     Rate and Rhythm: Normal rate. Rhythm irregularly irregular.     Heart sounds: Normal heart sounds. No murmur.  Pulmonary:     Effort: Pulmonary effort is normal.     Breath sounds: Normal breath sounds. No wheezing or rales.  Chest:     Chest wall: No tenderness.  Abdominal:     General: Bowel sounds are normal. There is no distension.     Palpations: Abdomen is soft. There is no mass.     Tenderness: There is no abdominal tenderness.  Musculoskeletal: Normal range of motion.  Neurological:     Mental Status: He is alert and oriented to person, place, and time.  Psychiatric:        Mood and Affect: Mood normal.        Behavior: Behavior normal.    Lab Results  Component Value Date   HGBA1C 5.6 08/11/2018   CBC    Component Value Date/Time   WBC 7.6 08/15/2018 0620   RBC 2.64 (L) 08/15/2018 0620   HGB 7.9 (L) 08/15/2018 0620   HGB 13.9 03/13/2018 1515   HCT 25.8 (L) 08/15/2018 0620   HCT 42.9 03/13/2018 1515   PLT 191 08/15/2018 0620   PLT 205 03/13/2018 1515   MCV 97.7 08/15/2018 0620   MCV 85 03/13/2018 1515   MCH 29.9 08/15/2018 0620   MCHC 30.6 08/15/2018 0620   RDW 15.2 08/15/2018 0620   RDW 15.2 03/13/2018 1515   LYMPHSABS 3.8 08/10/2018 1453   MONOABS 0.9 08/10/2018 1453   EOSABS 0.2 08/10/2018 1453   BASOSABS 0.1 08/10/2018 1453     Assessment & Plan:   1. Acute gastric ulcer with hemorrhage Symptomatic Continue Protonix We will send of CBC - CBC with Differential/Platelet  2. Atrial fibrillation with RVR (HCC) Continue  anticoagulation with Xarelto, rate control with carvedilol  3. Idiopathic chronic gout of foot without tophus, unspecified laterality Getting over a gout flare Low purine eating plan  4. Anemia, unspecified type From GI bleed We will repeat CBC  5. Prediabetes Stable with A1c of 5.6   No orders of the defined types were placed in this encounter.   Follow-up: Return in about 1 month (around 09/19/2018) for Follow-up of anemia.   Charlott Rakes MD

## 2018-08-19 NOTE — Patient Instructions (Signed)
Peptic Ulcer °A peptic ulcer is a painful sore in the lining of your esophagus, stomach, or the first part of your small intestine. You may have pain in the area between your chest and your belly button. The most common causes of an ulcer are: °· An infection. °· Using certain pain medicines too often or too much. ° °Follow these instructions at home: °· Avoid alcohol. °· Avoid caffeine. °· Do not use any tobacco products. These include cigarettes, chewing tobacco, and e-cigarettes. If you need help quitting, ask your doctor. °· Take over-the-counter and prescription medicines only as told by your doctor. Do not stop or change your medicines unless you talk with your doctor about it first. °· Keep all follow-up visits as told by your doctor. This is important. °Contact a doctor if: °· You do not get better in 7 days after you start treatment. °· You keep having an upset stomach (indigestion) or heartburn. °Get help right away if: °· You have sudden, sharp pain in your belly (abdomen). °· You have lasting belly pain. °· You have bloody poop (stool) or black, tarry poop. °· You throw up (vomit) blood. It may look like coffee grounds. °· You feel light-headed or feel like you may pass out (faint). °· You get weak. °· You get sweaty or feel sticky and cold to the touch (clammy). °This information is not intended to replace advice given to you by your health care provider. Make sure you discuss any questions you have with your health care provider. °Document Released: 11/15/2009 Document Revised: 01/05/2016 Document Reviewed: 05/22/2015 °Elsevier Interactive Patient Education © 2018 [employer] ° °

## 2018-08-20 LAB — CBC WITH DIFFERENTIAL/PLATELET
Basophils Absolute: 0.1 10*3/uL (ref 0.0–0.2)
Basos: 1 %
EOS (ABSOLUTE): 0.4 10*3/uL (ref 0.0–0.4)
Eos: 6 %
Hematocrit: 26.1 % — ABNORMAL LOW (ref 37.5–51.0)
Hemoglobin: 8.3 g/dL — ABNORMAL LOW (ref 13.0–17.7)
Immature Grans (Abs): 0 10*3/uL (ref 0.0–0.1)
Immature Granulocytes: 0 %
Lymphocytes Absolute: 1.6 10*3/uL (ref 0.7–3.1)
Lymphs: 22 %
MCH: 27.8 pg (ref 26.6–33.0)
MCHC: 31.8 g/dL (ref 31.5–35.7)
MCV: 87 fL (ref 79–97)
Monocytes Absolute: 0.5 10*3/uL (ref 0.1–0.9)
Monocytes: 7 %
Neutrophils Absolute: 4.6 10*3/uL (ref 1.4–7.0)
Neutrophils: 64 %
Platelets: 409 10*3/uL (ref 150–450)
RBC: 2.99 x10E6/uL — ABNORMAL LOW (ref 4.14–5.80)
RDW: 15.4 % (ref 12.3–15.4)
WBC: 7.2 10*3/uL (ref 3.4–10.8)

## 2018-09-11 ENCOUNTER — Encounter (HOSPITAL_BASED_OUTPATIENT_CLINIC_OR_DEPARTMENT_OTHER): Payer: Self-pay

## 2018-09-25 ENCOUNTER — Ambulatory Visit: Payer: Self-pay | Admitting: Family Medicine

## 2018-10-15 ENCOUNTER — Ambulatory Visit (HOSPITAL_BASED_OUTPATIENT_CLINIC_OR_DEPARTMENT_OTHER): Payer: Medicaid Other

## 2018-11-06 MED FILL — !COLCRYS 0.6 MG TABLET: 0.6 MG | 15 days supply | Qty: 30 | Fill #3

## 2018-11-06 MED FILL — LOSARTAN POTASSIUM 50 MG TA: 50 | 30 days supply | Qty: 30 | Fill #2

## 2018-11-06 MED FILL — CARVEDILOL 25 MG TABLET: 25 | 30 days supply | Qty: 60 | Fill #6

## 2018-11-13 ENCOUNTER — Telehealth: Payer: Self-pay

## 2018-11-13 NOTE — Telephone Encounter (Signed)
Patient was called, stated that he has had increased SOB, advised that he would like an appointment with Lurena Joiner, [place]. I was able to make appointment for next week 03/19. Patient verbalized understanding.  He was also advised if he began having SOB at rest and unable to catch his breath to go to ER.

## 2018-11-20 ENCOUNTER — Other Ambulatory Visit: Payer: Self-pay

## 2018-11-20 ENCOUNTER — Ambulatory Visit: Payer: Medicaid Other | Attending: Family Medicine | Admitting: Physician Assistant

## 2018-11-20 VITALS — BP 147/100 | HR 99 | Temp 97.8°F | Resp 16 | Wt 375.0 lb

## 2018-11-20 DIAGNOSIS — N183 Chronic kidney disease, stage 3 unspecified: Secondary | ICD-10-CM

## 2018-11-20 DIAGNOSIS — I4891 Unspecified atrial fibrillation: Secondary | ICD-10-CM | POA: Insufficient documentation

## 2018-11-20 DIAGNOSIS — Z7901 Long term (current) use of anticoagulants: Secondary | ICD-10-CM | POA: Insufficient documentation

## 2018-11-20 DIAGNOSIS — R0602 Shortness of breath: Secondary | ICD-10-CM | POA: Diagnosis not present

## 2018-11-20 DIAGNOSIS — E1122 Type 2 diabetes mellitus with diabetic chronic kidney disease: Secondary | ICD-10-CM | POA: Insufficient documentation

## 2018-11-20 DIAGNOSIS — M1A0790 Idiopathic chronic gout, unspecified ankle and foot, without tophus (tophi): Secondary | ICD-10-CM | POA: Diagnosis not present

## 2018-11-20 DIAGNOSIS — Z760 Encounter for issue of repeat prescription: Secondary | ICD-10-CM | POA: Diagnosis not present

## 2018-11-20 DIAGNOSIS — I10 Essential (primary) hypertension: Secondary | ICD-10-CM | POA: Diagnosis not present

## 2018-11-20 DIAGNOSIS — R609 Edema, unspecified: Secondary | ICD-10-CM | POA: Diagnosis not present

## 2018-11-20 DIAGNOSIS — I5021 Acute systolic (congestive) heart failure: Secondary | ICD-10-CM | POA: Diagnosis not present

## 2018-11-20 DIAGNOSIS — D62 Acute posthemorrhagic anemia: Secondary | ICD-10-CM | POA: Diagnosis not present

## 2018-11-20 DIAGNOSIS — Z79899 Other long term (current) drug therapy: Secondary | ICD-10-CM | POA: Insufficient documentation

## 2018-11-20 DIAGNOSIS — I130 Hypertensive heart and chronic kidney disease with heart failure and stage 1 through stage 4 chronic kidney disease, or unspecified chronic kidney disease: Secondary | ICD-10-CM | POA: Insufficient documentation

## 2018-11-20 MED ORDER — CARVEDILOL 25 MG PO TABS: 25.0000 mg | ORAL_TABLET | Freq: Two times a day (BID) | ORAL | 3 refills | Status: DC

## 2018-11-20 MED ORDER — PANTOPRAZOLE SODIUM 40 MG PO TBEC: 40.0000 mg | DELAYED_RELEASE_TABLET | Freq: Every day | ORAL | 1 refills | Status: DC

## 2018-11-20 MED ORDER — ALLOPURINOL 300 MG PO TABS: 300.0000 mg | ORAL_TABLET | Freq: Every day | ORAL | 6 refills | Status: DC

## 2018-11-20 MED ORDER — COLCHICINE 0.6 MG PO TABS: 0.6000 mg | ORAL_TABLET | Freq: Once | ORAL | 2 refills | Status: DC | PRN

## 2018-11-20 NOTE — Progress Notes (Signed)
Patient ID: Joe Blake, male   DOB: 01/21/1966, 53 y.o.   MRN: [id number]   Deral Schellenberg, is a 53 y.o. male  MGQ:[id number]  OIZ:[id number]  DOB - [birth year]  Subjective:  Chief Complaint and HPI: Joe Blake is a 53 y.o. male here today for med RF and blood work.  He has several questions.  Oral B12 supplements cause him to feel drowsy 15 mins after taking them.   For the the past 1.5 weeks he has been having odd dreams.  Some SOB and some edema.  These are not new problems.  No orthopnea.  Denies CP/palpitations/dizziness.  On Lasix 40mg  daily but not in chart.  He says it was given him by cardiology.    ROS:   Constitutional:  No f/c, No night sweats, No unexplained weight loss. EENT:  No vision changes, No blurry vision, No hearing changes. No mouth, throat, or ear problems.  Respiratory: No cough, some SOB Cardiac: No CP, no palpitations GI:  No abd pain, No N/V/D. GU: No Urinary s/sx Musculoskeletal: No joint pain unless has goutflare Neuro: No headache, no dizziness, no motor weakness.  Skin: No rash Endocrine:  No polydipsia. No polyuria.  Psych: Denies SI/HI  No problems updated.  ALLERGIES: No Known Allergies  PAST MEDICAL HISTORY: Past Medical History:  Diagnosis Date  . Acute systolic HF (heart failure) ([place])   . Atrial fibrillation ([place])   . Diabetes mellitus without complication ([place])   . Hypertension   . Morbid obesity ([place])     MEDICATIONS AT HOME: Prior to Admission medications   Medication Sig Start Date End Date Taking? Authorizing Provider  allopurinol (ZYLOPRIM) 300 MG tablet Take 1 tablet (300 mg total) by mouth daily. 11/20/18   Argentina Donovan, PA-C  carvedilol (COREG) 25 MG tablet Take 1 tablet (25 mg total) by mouth 2 (two) times daily with a meal. 11/20/18 11/15/19  Benford Asch, Dionne Bucy, PA-C  colchicine 0.6 MG tablet Take 1 tablet (0.6 mg total) by mouth once as needed (take 1.2mg  and then 0.6mg  1 hour later if still having symptoms). 11/20/18   Argentina Donovan, PA-C  pantoprazole (PROTONIX) 40 MG tablet Take 1 tablet (40 mg total) by mouth daily. 11/20/18   Amato Sevillano, Dionne Bucy, PA-C  XARELTO 20 MG TABS tablet TAKE 1 TABLET BY MOUTH DAILY WITH SUPPER 08/20/18   Charlott Rakes, MD     Objective:  EXAM:   Vitals:   11/20/18 1547  BP: (!) 147/100  Pulse: 99  Resp: 16  Temp: 97.8 F (36.6 C)  TempSrc: Oral  SpO2: 99%  Weight: (!) 375 lb (170.1 kg)    General appearance : A&OX3. NAD. Non-toxic-appearing,  Morbidly obese.  HEENT: Atraumatic and Normocephalic.  PERRLA. EOM intact.   Neck: supple, no JVD. No cervical lymphadenopathy. No thyromegaly Chest/Lungs:  Breathing-non-labored, Good air entry bilaterally, breath sounds normal without rales, rhonchi, or wheezing  CVS: S1 S2 regular, no murmurs, gallops, rubs  Extremities: Bilateral Lower Ext shows B 2+ edema, both legs are warm to touch with = pulse throughout Neurology:  CN II-XII grossly intact, Non focal.   Psych:  TP linear. J/I WNL. Normal speech. Appropriate eye contact and affect.  Skin:  No Rash  Data Review Lab Results  Component Value Date   HGBA1C 5.6 08/11/2018   HGBA1C 5.3 05/20/2018   HGBA1C 5.3 05/20/2018   HGBA1C 5.3 (A) 05/20/2018   HGBA1C 5.3 05/20/2018     Assessment & Plan  1. Idiopathic chronic gout of foot without tophus, unspecified laterality stable - colchicine 0.6 MG tablet; Take 1 tablet (0.6 mg total) by mouth once as needed (take 1.2mg  and then 0.6mg  1 hour later if still having symptoms).  Dispense: 30 tablet; Refill: 2 - allopurinol (ZYLOPRIM) 300 MG tablet; Take 1 tablet (300 mg total) by mouth daily.  Dispense: 30 tablet; Refill: 6  2. CKD (chronic kidney disease) stage 3, GFR 30-59 ml/min (HCC)  - Comprehensive metabolic panel  3. Essential hypertension Continue current regimen - carvedilol (COREG) 25 MG tablet; Take 1 tablet (25 mg total) by mouth 2 (two) times daily with a meal.  Dispense: 180 tablet; Refill: 3 - Comprehensive  metabolic panel -xarelto and lasix tbd by cardiology.  4. Acute blood loss anemia Stable-change pantoprazole to once daily - CBC with Differential/Platelet - pantoprazole (PROTONIX) 40 MG tablet; Take 1 tablet (40 mg total) by mouth daily.  Dispense: 30 tablet; Refill: 1 - Ambulatory referral to Gastroenterology  Patient have been counseled extensively about nutrition and exercise  Return in about 3 months (around 02/20/2019) for [place]; chronic medical conditions.  The patient was given clear instructions to go to ER or return to medical center if symptoms don't improve, worsen or new problems develop. The patient verbalized understanding. The patient was told to call to get lab results if they haven't heard anything in the next week.     Freeman Caldron, PA-C [facility] [place], [place]   11/20/2018, 3:49 PM

## 2018-11-21 ENCOUNTER — Encounter: Payer: Self-pay | Admitting: Cardiology

## 2018-11-21 ENCOUNTER — Ambulatory Visit (INDEPENDENT_AMBULATORY_CARE_PROVIDER_SITE_OTHER): Payer: Medicaid Other | Admitting: Cardiology

## 2018-11-21 VITALS — BP 158/100 | HR 86 | Ht 73.0 in | Wt 374.0 lb

## 2018-11-21 DIAGNOSIS — I482 Chronic atrial fibrillation, unspecified: Secondary | ICD-10-CM | POA: Diagnosis not present

## 2018-11-21 LAB — CBC WITH DIFFERENTIAL/PLATELET
Basophils Absolute: 0.1 10*3/uL (ref 0.0–0.2)
Basos: 1 %
EOS (ABSOLUTE): 0.5 10*3/uL — ABNORMAL HIGH (ref 0.0–0.4)
EOS: 5 %
HEMOGLOBIN: 9.1 g/dL — AB (ref 13.0–17.7)
Hematocrit: 32.8 % — ABNORMAL LOW (ref 37.5–51.0)
Immature Grans (Abs): 0 10*3/uL (ref 0.0–0.1)
Immature Granulocytes: 0 %
Lymphocytes Absolute: 1.5 10*3/uL (ref 0.7–3.1)
Lymphs: 16 %
MCH: 20.3 pg — AB (ref 26.6–33.0)
MCHC: 27.7 g/dL — ABNORMAL LOW (ref 31.5–35.7)
MCV: 73 fL — ABNORMAL LOW (ref 79–97)
Monocytes Absolute: 0.9 10*3/uL (ref 0.1–0.9)
Monocytes: 10 %
Neutrophils Absolute: 6.2 10*3/uL (ref 1.4–7.0)
Neutrophils: 68 %
Platelets: 425 10*3/uL (ref 150–450)
RBC: 4.48 x10E6/uL (ref 4.14–5.80)
RDW: 17.9 % — ABNORMAL HIGH (ref 11.6–15.4)
WBC: 9.2 10*3/uL (ref 3.4–10.8)

## 2018-11-21 LAB — COMPREHENSIVE METABOLIC PANEL
ALT: 9 IU/L (ref 0–44)
AST: 14 IU/L (ref 0–40)
Albumin/Globulin Ratio: 1.4 (ref 1.2–2.2)
Albumin: 3.9 g/dL (ref 3.8–4.9)
Alkaline Phosphatase: 94 IU/L (ref 39–117)
BUN / CREAT RATIO: 14 (ref 9–20)
BUN: 20 mg/dL (ref 6–24)
Bilirubin Total: 0.2 mg/dL (ref 0.0–1.2)
CO2: 26 mmol/L (ref 20–29)
Calcium: 9.1 mg/dL (ref 8.7–10.2)
Chloride: 102 mmol/L (ref 96–106)
Creatinine, Ser: 1.41 mg/dL — ABNORMAL HIGH (ref 0.76–1.27)
GFR calc Af Amer: 66 mL/min/{1.73_m2} (ref >59)
GFR calc non Af Amer: 57 mL/min/{1.73_m2} — ABNORMAL LOW (ref >59)
Globulin, Total: 2.7 g/dL (ref 1.5–4.5)
Glucose: 105 mg/dL — ABNORMAL HIGH (ref 65–99)
Potassium: 4.4 mmol/L (ref 3.5–5.2)
Sodium: 142 mmol/L (ref 134–144)
Total Protein: 6.6 g/dL (ref 6.0–8.5)

## 2018-11-21 MED ORDER — LOSARTAN POTASSIUM 50 MG PO TABS: 50.0000 mg | ORAL_TABLET | Freq: Every day | ORAL | 3 refills | Status: DC

## 2018-11-21 MED ORDER — RIVAROXABAN 20 MG PO TABS: 20.0000 mg | ORAL_TABLET | Freq: Every day | ORAL | 6 refills | Status: DC

## 2018-11-21 MED ORDER — FUROSEMIDE 40 MG PO TABS: ORAL_TABLET | ORAL | 0 refills | Status: DC

## 2018-11-21 MED FILL — ALLOPURINOL 300 MG TAB: 300 | 30 days supply | Qty: 30 | Fill #0

## 2018-11-21 MED FILL — FUROSEMIDE 40 MG TAB: 40 | 3 days supply | Qty: 6 | Fill #0

## 2018-11-21 MED FILL — PANTOPRAZOLE SOD DR 40 MG T: 40 | 30 days supply | Qty: 30 | Fill #0

## 2018-11-21 NOTE — Progress Notes (Signed)
Error in charting. Pt seen by Kerin Ransom, PA

## 2018-11-21 NOTE — Progress Notes (Signed)
11/21/2018 Joe Blake   11-09-1965  [id number]  Primary Physician Charlott Rakes, MD Primary Cardiologist:  Dr Percival Spanish  HPI:  Pt I a pleasant 52y.o.morbidly obese (BMI 51) malewith a history of HTN and DM who was admitted 11/19/17 with DOE and was found to have AF with RVR and CHF.HisEFwas 20-25%. Etiology not yet determined, presumably NICM. [facility] [place] [facility] and was told his heart was "pumping at 33%". No history of cath,orMI and he is a poor candidate for Myoviewsecondary to morbid obeisty. He was not aware of previous diagnosis of atrial fibrillation. He did say he had a sleep study in the past but could not tolerate C-pap. His CHA2DS-Vasc score is 3 (HTN, DM II, CHF). He was initially placed on  Coumadinas opposed to a NOAC secondary to morbid obesity. We couldn't keep his INR therapeutic and he was changed to Xarelto.    The pt underwent OP DCCV 03/18/18 but failed to hold NSR. It's unclear by his history wether or not this is causing him symptoms. The plan was to obtain a follow up echo and make further recommendations based on his EF.  His EF 05/30/2018 was 30-35%.  We had considered EP evaluation for consideration of ablation, though I'm not optimistic. We actually don't know the duration of his atrial arrhythmia but it has been at least since March 2019. We suspect he has significant untreated sleep apnea as well so it's not clear that we could ever get him back in NSR. In addition his care and treatment options are limited due to his financial situation, he has intermittent run out of his medications. We have filed for assurance for his Xarelto.   He was admitted to [facility] in Dec 2019 with a GI bleed.  He was transfused 2 units.  His Xarelto was held as was his Losartan secondary to hypotension.  After discharge he went to [place].  It was supposed to be just for a week or two but his car broke down and he was there 4-6 weeks.  On  the way back to [place] his car finally gave up in [place] and he stayed another couple of weeks there.  During this time he ran out of several of his medications including his Xarelto and Coreg.  He rationed his Lasix.  Since returning a few weeks ago he has resumed Xarelto and Coreg.  He started taking his Lasix daily about a week ago.  He has noted increased LE edema and dyspnea though he says this is somewhat improved since resuming Lasix QD.     Current Outpatient Medications  Medication Sig Dispense Refill  . allopurinol (ZYLOPRIM) 300 MG tablet Take 1 tablet (300 mg total) by mouth daily. 30 tablet 6  . carvedilol (COREG) 25 MG tablet Take 1 tablet (25 mg total) by mouth 2 (two) times daily with a meal. 180 tablet 3  . colchicine 0.6 MG tablet Take 1 tablet (0.6 mg total) by mouth once as needed (take 1.2mg  and then 0.6mg  1 hour later if still having symptoms). 30 tablet 2  . furosemide (LASIX) 40 MG tablet Take 40 mg by mouth daily as needed.    . pantoprazole (PROTONIX) 40 MG tablet Take 1 tablet (40 mg total) by mouth daily. 30 tablet 1  . XARELTO 20 MG TABS tablet TAKE 1 TABLET BY MOUTH DAILY WITH SUPPER 30 tablet 2   No current facility-administered medications for this visit.  No Known Allergies  Past Medical History:  Diagnosis Date  . Acute systolic HF (heart failure) ([place])   . Atrial fibrillation ([place])   . Diabetes mellitus without complication ([place])   . Hypertension   . Morbid obesity ([place])     Social History   Socioeconomic History  . Marital status: Single    Spouse name: Not on file  . Number of children: Not on file  . Years of education: Not on file  . Highest education level: Not on file  Occupational History  . Not on file  Social Needs  . Financial resource strain: Somewhat hard  . Food insecurity:    Worry: Patient refused    Inability: Patient refused  . Transportation needs:    Medical: No    Non-medical: No  Tobacco Use  . Smoking status: Never  Smoker  . Smokeless tobacco: Never Used  Substance and Sexual Activity  . Alcohol use: No    Frequency: Never  . Drug use: No  . Sexual activity: Yes    Partners: Female  Lifestyle  . Physical activity:    Days per week: Patient refused    Minutes per session: Patient refused  . Stress: Only a little  Relationships  . Social connections:    Talks on phone: More than three times a week    Gets together: Twice a week    Attends religious service: Patient refused    Active member of club or organization: No    Attends meetings of clubs or organizations: Never    Relationship status: Never married  . Intimate partner violence:    Fear of current or ex partner: Patient refused    Emotionally abused: Patient refused    Physically abused: Patient refused    Forced sexual activity: Patient refused  Other Topics Concern  . Not on file  Social History Narrative  . Not on file     Family History  Problem Relation Age of Onset  . Heart attack Paternal Grandfather   . Hyperlipidemia Mother   . Hypertension Mother      Review of Systems: General: negative for chills, fever, night sweats or weight changes.  Cardiovascular: negative for chest pain, orthopnea, palpitations, paroxysmal nocturnal dyspnea  Dermatological: negative for rash Respiratory: negative for cough or wheezing Urologic: negative for hematuria Abdominal: negative for nausea, vomiting, diarrhea, bright red blood per rectum, melena, or hematemesis Neurologic: negative for visual changes, syncope, or dizziness All other systems reviewed and are otherwise negative except as noted above.    Blood pressure (!) 158/100, pulse 86, height 6\' 1"  (1.854 m), weight (!) 374 lb (169.6 kg).  General appearance: alert, cooperative, no distress and morbidly obese Lungs: decreased breath sounds, no rales Heart: regular rate and rhythm Extremities: 2+ bilateral brawny edema Pulses: diminished  EKG AF with VR-86, IVCD, PVC   ASSESSMENT AND PLAN:   Acute on chronic combined CHF His weight is up 13 lbs since Dec (medication non compliance). Increase Lasix to 80 mg QD x 3 days, then go back to 40 mg daily.  I have asked him to try and obtain a scale.   Persistent atrial fibrillation ([place]) He remains in AF with CVR-   Anticoagulated CHADS VASC=3,- resume Xarelto  GI bleed H/O GI bleed- PUD- Dec 2019.  I asked him to resume Protonix 40 mg daily  HTN (hypertension) Repeat B/P by me 138/90 Resume Losartan 50 mg daily  CRI-3- Last SCr 1.4 on 11/20/2018.  Check BMP  two weeks.   Obesity, Class III, BMI 40-49.9 (morbid obesity) (HCC) BMI 47.9. Couldn't tolerate C-pap in the past   PLAN  Increase Lasix to 80 mg daily x 3 days, then back to 40 mg daily.  Resume Xarelto, Protonix, and Losartan.  BMP two weeks, OV 4-6 weeks.   Kerin Ransom PA-C 11/21/2018 12:09 PM

## 2018-11-21 NOTE — Patient Instructions (Signed)
Medication Instructions:  INCREASE Lasix 80mg  Take 1 tablet once a day for 3 days RESUME Losartan 50mg  Take 1 tablet daily  RESUME Protonix 40mg  Take 1 tablet daily  If you need a refill on your cardiac medications before your next appointment, please call your pharmacy.   Lab work: Your physician recommends that you return for lab work in: TODAY-BMET If you have labs (blood work) drawn today and your tests are completely normal, you will receive your results only by: Marland Kitchen MyChart Message (if you have MyChart) OR . A paper copy in the mail If you have any lab test that is abnormal or we need to change your treatment, we will call you to review the results.  Testing/Procedures: NONE   Follow-Up: At [facility], you and your health needs are our priority.  As part of our continuing mission to provide you with exceptional heart care, we have created designated Provider Care Teams.  These Care Teams include your primary Cardiologist (physician) and Advanced Practice Providers (APPs -  Physician Assistants and Nurse Practitioners) who all work together to provide you with the care you need, when you need it. . Your physician recommends that you schedule a follow-up appointment in: 2 MONTHS with Kerin Ransom, PA-C  Any Other Special Instructions Will Be Listed Below (If Applicable).

## 2018-11-22 ENCOUNTER — Telehealth: Payer: Self-pay | Admitting: *Deleted

## 2018-11-22 LAB — BASIC METABOLIC PANEL
BUN/Creatinine Ratio: 13 (ref 9–20)
BUN: 18 mg/dL (ref 6–24)
CO2: 23 mmol/L (ref 20–29)
Calcium: 9.6 mg/dL (ref 8.7–10.2)
Chloride: 98 mmol/L (ref 96–106)
Creatinine, Ser: 1.37 mg/dL — ABNORMAL HIGH (ref 0.76–1.27)
GFR calc Af Amer: 69 mL/min/{1.73_m2} (ref >59)
GFR calc non Af Amer: 59 mL/min/{1.73_m2} — ABNORMAL LOW (ref >59)
Glucose: 133 mg/dL — ABNORMAL HIGH (ref 65–99)
Potassium: 4.5 mmol/L (ref 3.5–5.2)
Sodium: 144 mmol/L (ref 134–144)

## 2018-11-22 NOTE — Telephone Encounter (Signed)
-----   Message from Argentina Donovan, [place] sent at 11/21/2018  8:29 AM EDT ----- Your hemoglobin has improved but is still not normal/as high as it should be.  Eat foods rich in Iron and see gastroenterologist as planned.  Your kidney function looks impaired and blood sugar is a little high.  You should drink more water and eliminate sugar and white carbohydrates from your diet.  Follow-up as planned.  Thanks, Freeman Caldron, PA-C

## 2018-11-22 NOTE — Telephone Encounter (Signed)
Patient verified DOB Patient is aware of labs still being improved but not normal. Patient is aware of needing to eat dark leafy greens to obtain more iron. Patient is also aware of keeping appointment with james edwards. Patient states he would await the phone call instead of taking down the number and contacting the office himself.

## 2018-11-26 ENCOUNTER — Other Ambulatory Visit: Payer: Self-pay

## 2018-11-26 MED ORDER — FUROSEMIDE 40 MG PO TABS: 40.0000 mg | ORAL_TABLET | Freq: Every day | ORAL | 1 refills | Status: DC

## 2018-11-26 MED ORDER — FUROSEMIDE 40 MG PO TABS: 40.0000 mg | ORAL_TABLET | Freq: Every day | ORAL | 2 refills | Status: DC

## 2018-11-26 NOTE — Telephone Encounter (Signed)
Pt sent mychart message:   Was just wondering if there was a reason why my furosemide prescription only had 6 pills in it instead of the usual 30.  Per [facility] OV note:  Lasix to 80 mg QD x 3 days, then go back to 40 mg daily  [facility] mychart email of clarification sent to pt

## 2018-11-28 MED FILL — FUROSEMIDE 40 MG TAB: 40 | 30 days supply | Qty: 30 | Fill #0

## 2018-12-08 ENCOUNTER — Encounter (HOSPITAL_BASED_OUTPATIENT_CLINIC_OR_DEPARTMENT_OTHER): Payer: Medicaid Other

## 2018-12-10 MED FILL — CARVEDILOL 25 MG TABLET: 25 | 30 days supply | Qty: 60 | Fill #0

## 2018-12-23 MED FILL — LOSARTAN POTASSIUM 50 MG TA: 50 | 30 days supply | Qty: 30 | Fill #3

## 2018-12-23 MED FILL — PANTOPRAZOLE SOD DR 40 MG T: 40 | 30 days supply | Qty: 30 | Fill #1

## 2018-12-23 MED FILL — FUROSEMIDE 40 MG TAB: 40 | 30 days supply | Qty: 30 | Fill #1

## 2019-01-10 ENCOUNTER — Encounter (HOSPITAL_BASED_OUTPATIENT_CLINIC_OR_DEPARTMENT_OTHER): Payer: Medicaid Other

## 2019-01-20 ENCOUNTER — Encounter: Payer: Self-pay | Admitting: Physician Assistant

## 2019-01-20 ENCOUNTER — Other Ambulatory Visit: Payer: Self-pay

## 2019-01-20 DIAGNOSIS — D62 Acute posthemorrhagic anemia: Secondary | ICD-10-CM

## 2019-01-20 MED ORDER — PANTOPRAZOLE SODIUM 40 MG PO TBEC: 40.0000 mg | DELAYED_RELEASE_TABLET | Freq: Every day | ORAL | 1 refills | Status: DC

## 2019-01-20 MED FILL — PANTOPRAZOLE SOD DR 40 MG T: 40 | 30 days supply | Qty: 30 | Fill #0

## 2019-01-21 MED FILL — LOSARTAN POTASSIUM 50 MG TA: 50 | 30 days supply | Qty: 30 | Fill #4

## 2019-01-21 MED FILL — ALLOPURINOL 300 MG TAB: 300 | 30 days supply | Qty: 30 | Fill #1

## 2019-01-21 MED FILL — $COLCRYS 0.6 MG TABLET: 0.6 | 30 days supply | Qty: 60 | Fill #0

## 2019-01-21 MED FILL — CARVEDILOL 25 MG TABLET: 25 | 30 days supply | Qty: 60 | Fill #1

## 2019-01-21 MED FILL — FUROSEMIDE 40 MG TAB: 40 | 30 days supply | Qty: 30 | Fill #2

## 2019-01-23 ENCOUNTER — Telehealth: Payer: Self-pay | Admitting: Cardiology

## 2019-01-23 NOTE — Telephone Encounter (Signed)
Consent/smartphone/ my chart active/pre reg completed

## 2019-01-28 ENCOUNTER — Telehealth: Payer: Self-pay

## 2019-01-28 ENCOUNTER — Telehealth (INDEPENDENT_AMBULATORY_CARE_PROVIDER_SITE_OTHER): Payer: Medicaid Other | Admitting: Cardiology

## 2019-01-28 ENCOUNTER — Encounter: Payer: Self-pay | Admitting: Cardiology

## 2019-01-28 VITALS — HR 71 | Ht 73.0 in | Wt 374.0 lb

## 2019-01-28 DIAGNOSIS — Z8719 Personal history of other diseases of the digestive system: Secondary | ICD-10-CM

## 2019-01-28 DIAGNOSIS — I4819 Other persistent atrial fibrillation: Secondary | ICD-10-CM

## 2019-01-28 DIAGNOSIS — I10 Essential (primary) hypertension: Secondary | ICD-10-CM

## 2019-01-28 DIAGNOSIS — I5022 Chronic systolic (congestive) heart failure: Secondary | ICD-10-CM | POA: Diagnosis not present

## 2019-01-28 DIAGNOSIS — Z7901 Long term (current) use of anticoagulants: Secondary | ICD-10-CM | POA: Diagnosis not present

## 2019-01-28 DIAGNOSIS — E66813 Obesity, class 3: Secondary | ICD-10-CM

## 2019-01-28 DIAGNOSIS — G4733 Obstructive sleep apnea (adult) (pediatric): Secondary | ICD-10-CM | POA: Diagnosis not present

## 2019-01-28 NOTE — Telephone Encounter (Signed)
Contacted patient to let inform him that his Disability parking form has been completed and placed up front for pick up. He voiced understanding.

## 2019-01-28 NOTE — Patient Instructions (Signed)
Medication Instructions:  Your physician recommends that you continue on your current medications as directed. Please refer to the Current Medication list given to you today. If you need a refill on your cardiac medications before your next appointment, please call your pharmacy.   Lab work: Have completed at your PCP's office If you have labs (blood work) drawn today and your tests are completely normal, you will receive your results only by: Marland Kitchen MyChart Message (if you have MyChart) OR . A paper copy in the mail If you have any lab test that is abnormal or we need to change your treatment, we will call you to review the results.  Testing/Procedures: None   Follow-Up: At [facility], you and your health needs are our priority.  As part of our continuing mission to provide you with exceptional heart care, we have created designated Provider Care Teams.  These Care Teams include your primary Cardiologist (physician) and Advanced Practice Providers (APPs -  Physician Assistants and Nurse Practitioners) who all work together to provide you with the care you need, when you need it. . Your physician recommends that you schedule a follow-up appointment in: 3-4 months with Kerin Ransom, PA-C  Any Other Special Instructions Will Be Listed Below (If Applicable).

## 2019-01-28 NOTE — Telephone Encounter (Signed)
Called patient to discuss AVS instructions. Patient agreeable with Luke's recommendations and voiced understanding.

## 2019-01-28 NOTE — Progress Notes (Signed)
Virtual Visit via Video Note   This visit type was conducted due to national recommendations for restrictions regarding the COVID-19 Pandemic (e.g. social distancing) in an effort to limit this patient's exposure and mitigate transmission in our community.  Due to his co-morbid illnesses, this patient is at least at moderate risk for complications without adequate follow up.  This format is felt to be most appropriate for this patient at this time.  All issues noted in this document were discussed and addressed.  A limited physical exam was performed with this format.  Please refer to the patient's chart for his consent to telehealth for [facility].   Date:  01/28/2019   ID:  Joe Blake, DOB Jan 25, 1966, MRN [id number]  Patient Location: Home Provider Location: Office  PCP:  Charlott Rakes, MD  Cardiologist:  Minus Breeding, MD  Electrophysiologist:  None   Evaluation Performed:  Follow-Up Visit  Chief Complaint:  none  History of Present Illness:    Joe Blake is a 53 y.o. male with a history of presumed NICM, PAF, obesity and OSA, and past GI bleed Dec 2019.  He was contacted today for routine follow up.  Please refer to my 11/21/2018 office note for complete details of his medical history.  When I saw him in March he had been out of some of his medications, I increased his Lasix for a few days and had him resume his Xarelto, Protonix, and Losartan.   Since then he says he has done well.  He has noticed some increase in exercise tolerance.  He does not weigh himself daily.  His C-pap titration study has been put off secondary to the pandemic.   The patient does not have symptoms concerning for COVID-19 infection (fever, chills, cough, or new shortness of breath).    Past Medical History:  Diagnosis Date   Acute systolic HF (heart failure) (HCC)    Atrial fibrillation ([place])    Diabetes mellitus without complication ([place])    Hypertension    Morbid obesity ([place])    Past  Surgical History:  Procedure Laterality Date   ABDOMINAL SURGERY     APPENDECTOMY     at 53 years old   BIOPSY  08/11/2018   Procedure: BIOPSY;  Surgeon: Laurence Spates, MD;  Location: [facility];  Service: Endoscopy;;   CARDIOVERSION N/A 03/18/2018   Procedure: CARDIOVERSION;  Surgeon: Skeet Latch, MD;  Location: [facility];  Service: Cardiovascular;  Laterality: N/A;   ESOPHAGOGASTRODUODENOSCOPY (EGD) WITH PROPOFOL N/A 08/11/2018   Procedure: ESOPHAGOGASTRODUODENOSCOPY (EGD) WITH PROPOFOL;  Surgeon: Laurence Spates, MD;  Location: [facility];  Service: Endoscopy;  Laterality: N/A;     Current Meds  Medication Sig   allopurinol (ZYLOPRIM) 300 MG tablet Take 1 tablet (300 mg total) by mouth daily.   carvedilol (COREG) 25 MG tablet Take 1 tablet (25 mg total) by mouth 2 (two) times daily with a meal.   colchicine 0.6 MG tablet Take 1 tablet (0.6 mg total) by mouth once as needed (take 1.2mg  and then 0.6mg  1 hour later if still having symptoms).   furosemide (LASIX) 40 MG tablet Take 1 tablet (40 mg total) by mouth daily.   losartan (COZAAR) 50 MG tablet Take 1 tablet (50 mg total) by mouth daily.   pantoprazole (PROTONIX) 40 MG tablet Take 1 tablet (40 mg total) by mouth daily.   rivaroxaban (XARELTO) 20 MG TABS tablet Take 1 tablet (20 mg total) by mouth daily with supper.  Allergies:   Patient has no known allergies.   Social History   Tobacco Use   Smoking status: Never Smoker   Smokeless tobacco: Never Used  Substance Use Topics   Alcohol use: No    Frequency: Never   Drug use: No     Family Hx: The patient's family history includes Heart attack in his paternal grandfather; Hyperlipidemia in his mother; Hypertension in his mother.  ROS:   Please see the history of present illness.   All other systems reviewed and are negative.   Prior CV studies:   The following studies were reviewed today:   Labs/Other Tests and Data Reviewed:    EKG:   No ECG reviewed.  Recent Labs: 11/20/2018: ALT 9; Hemoglobin 9.1; Platelets 425 11/21/2018: BUN 18; Creatinine, Ser 1.37; Potassium 4.5; Sodium 144   Recent Lipid Panel No results found for: CHOL, TRIG, HDL, CHOLHDL, LDLCALC, LDLDIRECT  Wt Readings from Last 3 Encounters:  01/28/19 (!) 374 lb (169.6 kg)  11/21/18 (!) 374 lb (169.6 kg)  11/20/18 (!) 375 lb (170.1 kg)     Objective:    Vital Signs:  Pulse 71    Ht 6\' 1"  (1.854 m)    Wt (!) 374 lb (169.6 kg)    SpO2 98%    BMI 49.34 kg/m    VITAL SIGNS:  reviewed Pt is obese, NAD ASSESSMENT & PLAN:    Chronic CHF- Currently stable  Persistent atrial fibrillation (HCC) AF with CVR-   Anticoagulated CHADS VASC=3, on Xarelto  GI bleed H/O GI bleed- PUD- Dec 2019.  I asked him to resume Protonix 40 mg daily.  He will have labs drawn by his PCP in a few weeks.  HTN (hypertension) Repeat B/P by me 138/90 Resume Losartan 50 mg daily  CRI-3- Last SCr 1.4 on 11/20/2018.    Obesity, Class III, BMI 40-49.9 (morbid obesity) (HCC) BMI 47.9. Couldn't tolerate C-pap in the past  COVID-19 Education: The signs and symptoms of COVID-19 were discussed with the patient and how to seek care for testing (follow up with PCP or arrange E-visit).  The importance of social distancing was discussed today.  Time:   Today, I have spent 15 minutes with the patient with telehealth technology discussing the above problems.     Medication Adjustments/Labs and Tests Ordered: Current medicines are reviewed at length with the patient today.  Concerns regarding medicines are outlined above.   Tests Ordered: No orders of the defined types were placed in this encounter.   Medication Changes: No orders of the defined types were placed in this encounter.   Disposition:  Follow up Labs to be done by his PCP in 2-3 weeks.  F/U with cardiology in the Fall  Signed, Jamaiya Tunnell, PA-C  01/28/2019 10:39 AM    [place]

## 2019-02-14 ENCOUNTER — Encounter (HOSPITAL_BASED_OUTPATIENT_CLINIC_OR_DEPARTMENT_OTHER): Payer: Medicaid Other

## 2019-02-18 MED FILL — LOSARTAN POTASSIUM 50 MG TA: 50 | 30 days supply | Qty: 30 | Fill #5

## 2019-02-18 MED FILL — PANTOPRAZOLE SOD DR 40 MG T: 40 | 30 days supply | Qty: 30 | Fill #1

## 2019-02-18 MED FILL — $COLCRYS 0.6 MG TABLET: 0.6 | 15 days supply | Qty: 30 | Fill #1

## 2019-02-18 MED FILL — FUROSEMIDE 40 MG TAB: 40 | 90 days supply | Qty: 90 | Fill #0

## 2019-02-18 MED FILL — ALLOPURINOL 300 MG TAB: 300 | 30 days supply | Qty: 30 | Fill #2

## 2019-02-21 MED FILL — CARVEDILOL 25 MG TABLET: 25 | 30 days supply | Qty: 60 | Fill #2

## 2019-02-24 ENCOUNTER — Ambulatory Visit: Payer: Medicaid Other | Attending: Family Medicine | Admitting: Family Medicine

## 2019-02-24 ENCOUNTER — Encounter: Payer: Self-pay | Admitting: Family Medicine

## 2019-02-24 ENCOUNTER — Other Ambulatory Visit: Payer: Self-pay

## 2019-02-24 VITALS — BP 152/116 | HR 105 | Temp 98.1°F | Ht 73.0 in | Wt 381.8 lb

## 2019-02-24 DIAGNOSIS — Z79899 Other long term (current) drug therapy: Secondary | ICD-10-CM | POA: Diagnosis not present

## 2019-02-24 DIAGNOSIS — Z8719 Personal history of other diseases of the digestive system: Secondary | ICD-10-CM

## 2019-02-24 DIAGNOSIS — Z7901 Long term (current) use of anticoagulants: Secondary | ICD-10-CM | POA: Insufficient documentation

## 2019-02-24 DIAGNOSIS — I4892 Unspecified atrial flutter: Secondary | ICD-10-CM | POA: Insufficient documentation

## 2019-02-24 DIAGNOSIS — I5022 Chronic systolic (congestive) heart failure: Secondary | ICD-10-CM | POA: Insufficient documentation

## 2019-02-24 DIAGNOSIS — M109 Gout, unspecified: Secondary | ICD-10-CM | POA: Diagnosis not present

## 2019-02-24 DIAGNOSIS — E119 Type 2 diabetes mellitus without complications: Secondary | ICD-10-CM | POA: Insufficient documentation

## 2019-02-24 DIAGNOSIS — I110 Hypertensive heart disease with heart failure: Secondary | ICD-10-CM | POA: Diagnosis not present

## 2019-02-24 DIAGNOSIS — I10 Essential (primary) hypertension: Secondary | ICD-10-CM | POA: Diagnosis not present

## 2019-02-24 DIAGNOSIS — Z8249 Family history of ischemic heart disease and other diseases of the circulatory system: Secondary | ICD-10-CM | POA: Insufficient documentation

## 2019-02-24 DIAGNOSIS — I4891 Unspecified atrial fibrillation: Secondary | ICD-10-CM

## 2019-02-24 DIAGNOSIS — Z6841 Body Mass Index (BMI) 40.0 and over, adult: Secondary | ICD-10-CM

## 2019-02-24 DIAGNOSIS — K920 Hematemesis: Secondary | ICD-10-CM | POA: Insufficient documentation

## 2019-02-24 MED ORDER — FUROSEMIDE 40 MG PO TABS: 40.0000 mg | ORAL_TABLET | Freq: Two times a day (BID) | ORAL | 3 refills | Status: DC

## 2019-02-24 MED ORDER — LOSARTAN POTASSIUM 50 MG PO TABS: 50.0000 mg | ORAL_TABLET | Freq: Every day | ORAL | 3 refills | Status: DC

## 2019-02-24 NOTE — Progress Notes (Signed)
Subjective:  Patient ID: Joe Blake, male    DOB: May 03, 1966  Age: 53 y.o. MRN: [id number]  CC: Hypertension   HPI Joe Blake is a 53 year old male with history of Prediabetes (A1c 5.6) hypertension, gout, congestive heart failure (EF 30-35%), atrial fibrillation with RVR/atrial flutter status post DCCV on 03/2018 (currently on anticoagulation with Xarelto) here for follow-up visit. He had a recent hospitalization at [facility] for upper GI bleed after he had presented with hematemesis and was found to have hemoglobin of 7.8 on presentation. His most recent hemoglobin is 9.1 and he denies hematemesis, hematochezia and he was scheduled to see GI for a follow-up upper endoscopy which he is yet to undergo due to the ongoing pandemic.  He complains of dyspnea which is chronic and states in between mowing his lawn he has to sit to rest several times.  Denies pedal edema and has a two-pillow orthopnea; weight reveals a 7 pound weight gain in the last 1 month but he informs me his weight fluctuates.  He endorses compliance with his medications.  Denies chest pain, wheezing or cough. He had a telemedicine visit with the cardiology clinic close to 1 month ago.  Currently on 40 mg of Lasix daily but states he has to take Lasix twice daily for symptom relief on some occasions.  His blood pressure is elevated and he took his medications less than 2 hours ago as he states he wakes up late.  Does not exercise and compliance with a low-sodium diet cannot be ascertained.  He denies additional concerns today.  Past Medical History:  Diagnosis Date  . Acute systolic HF (heart failure) ([place])   . Atrial fibrillation ([place])   . Diabetes mellitus without complication ([place])   . Hypertension   . Morbid obesity ([place])     Past Surgical History:  Procedure Laterality Date  . ABDOMINAL SURGERY    . APPENDECTOMY     at 53 years old  . BIOPSY  08/11/2018   Procedure: BIOPSY;  Surgeon: Laurence Spates, MD;   Location: [facility] ENDOSCOPY;  Service: Endoscopy;;  . CARDIOVERSION N/A 03/18/2018   Procedure: CARDIOVERSION;  Surgeon: Skeet Latch, MD;  Location: [facility] ENDOSCOPY;  Service: Cardiovascular;  Laterality: N/A;  . ESOPHAGOGASTRODUODENOSCOPY (EGD) WITH PROPOFOL N/A 08/11/2018   Procedure: ESOPHAGOGASTRODUODENOSCOPY (EGD) WITH PROPOFOL;  Surgeon: Laurence Spates, MD;  Location: [facility] ENDOSCOPY;  Service: Endoscopy;  Laterality: N/A;    Family History  Problem Relation Age of Onset  . Heart attack Paternal Grandfather   . Hyperlipidemia Mother   . Hypertension Mother     No Known Allergies  Outpatient Medications Prior to Visit  Medication Sig Dispense Refill  . allopurinol (ZYLOPRIM) 300 MG tablet Take 1 tablet (300 mg total) by mouth daily. 30 tablet 6  . carvedilol (COREG) 25 MG tablet Take 1 tablet (25 mg total) by mouth 2 (two) times daily with a meal. 180 tablet 3  . colchicine 0.6 MG tablet Take 1 tablet (0.6 mg total) by mouth once as needed (take 1.2mg  and then 0.6mg  1 hour later if still having symptoms). 30 tablet 2  . pantoprazole (PROTONIX) 40 MG tablet Take 1 tablet (40 mg total) by mouth daily. 30 tablet 1  . rivaroxaban (XARELTO) 20 MG TABS tablet Take 1 tablet (20 mg total) by mouth daily with supper. 30 tablet 6  . furosemide (LASIX) 40 MG tablet Take 1 tablet (40 mg total) by mouth daily. 30 tablet 2  . losartan (COZAAR)  50 MG tablet Take 1 tablet (50 mg total) by mouth daily. 30 tablet 3   No facility-administered medications prior to visit.      ROS Review of Systems  Constitutional: Negative for activity change and appetite change.  HENT: Negative for sinus pressure and sore throat.   Eyes: Negative for visual disturbance.  Respiratory: Positive for shortness of breath. Negative for cough and chest tightness.   Cardiovascular: Negative for chest pain and leg swelling.  Gastrointestinal: Negative for abdominal distention, abdominal pain, constipation and diarrhea.   Endocrine: Negative.   Genitourinary: Negative for dysuria.  Musculoskeletal: Negative for joint swelling and myalgias.  Skin: Negative for rash.  Allergic/Immunologic: Negative.   Neurological: Negative for weakness, light-headedness and numbness.  Psychiatric/Behavioral: Negative for dysphoric mood and suicidal ideas.    Objective:  BP (!) 152/116   Pulse (!) 105   Temp 98.1 F (36.7 C) (Oral)   Ht 6\' 1"  (1.854 m)   Wt (!) 381 lb 12.8 oz (173.2 kg)   SpO2 96%   BMI 50.37 kg/m   BP/Weight 02/24/2019 01/28/2019 11/23/252  Systolic BP 270 - 623  Diastolic BP 762 - 831  Wt. (Lbs) 381.8 374 374  BMI 50.37 49.34 49.34      Physical Exam Constitutional:      Appearance: He is well-developed.  Neck:     Comments: Slightly elevated JVD Cardiovascular:     Rate and Rhythm: Normal rate. Rhythm irregular.     Heart sounds: Normal heart sounds. No murmur.  Pulmonary:     Effort: Pulmonary effort is normal.     Breath sounds: Normal breath sounds. No wheezing or rales.  Chest:     Chest wall: No tenderness.  Abdominal:     General: Bowel sounds are normal. There is no distension.     Palpations: Abdomen is soft. There is no mass.     Tenderness: There is no abdominal tenderness.  Musculoskeletal: Normal range of motion.  Neurological:     Mental Status: He is alert and oriented to person, place, and time.     CMP Latest Ref Rng & Units 11/21/2018 11/20/2018 08/15/2018  Glucose 65 - 99 mg/dL 133(H) 105(H) 147(H)  BUN 6 - 24 mg/dL 18 20 20   Creatinine 0.76 - 1.27 mg/dL 1.37(H) 1.41(H) 1.12  Sodium 134 - 144 mmol/L 144 142 140  Potassium 3.5 - 5.2 mmol/L 4.5 4.4 3.6  Chloride 96 - 106 mmol/L 98 102 108  CO2 20 - 29 mmol/L 23 26 24   Calcium 8.7 - 10.2 mg/dL 9.6 9.1 8.6(L)  Total Protein 6.0 - 8.5 g/dL - 6.6 -  Total Bilirubin 0.0 - 1.2 mg/dL - 0.2 -  Alkaline Phos 39 - 117 IU/L - 94 -  AST 0 - 40 IU/L - 14 -  ALT 0 - 44 IU/L - 9 -    Lipid Panel  No results found  for: CHOL, TRIG, HDL, CHOLHDL, VLDL, LDLCALC, LDLDIRECT  CBC    Component Value Date/Time   WBC 9.2 11/20/2018 1556   WBC 7.6 08/15/2018 0620   RBC 4.48 11/20/2018 1556   RBC 2.64 (L) 08/15/2018 0620   HGB 9.1 (L) 11/20/2018 1556   HCT 32.8 (L) 11/20/2018 1556   PLT 425 11/20/2018 1556   MCV 73 (L) 11/20/2018 1556   MCH 20.3 (L) 11/20/2018 1556   MCH 29.9 08/15/2018 0620   MCHC 27.7 (L) 11/20/2018 1556   MCHC 30.6 08/15/2018 0620   RDW 17.9 (H) 11/20/2018 1556  LYMPHSABS 1.5 11/20/2018 1556   MONOABS 0.9 08/10/2018 1453   EOSABS 0.5 (H) 11/20/2018 1556   BASOSABS 0.1 11/20/2018 1556    Lab Results  Component Value Date   HGBA1C 5.6 08/11/2018    Assessment & Plan:   1. Atrial fibrillation with RVR (HCC) Not in sinus rhythm He just took his medications 2 hours ago Last seen by cardiology last month Compliance has been emphasized  2. Chronic systolic congestive heart failure (HCC) NYHA III He is chronically dyspneic We will send of BNP meanwhile I have increased Lasix to 40 mg twice daily Weight loss would be beneficial Daily weights, low-sodium diet, continue Lasix, carvedilol, ARB - Brain natriuretic peptide  3. History of GI bleed He is yet to have follow-up EGD Due to fatigue and dyspnea we will send off CBC - CBC with Differential/Platelet  4. Body mass index (BMI) 45.0-49.9, adult (HCC) Advised weight loss will be beneficial Recommended exercise, counseled on reducing portion sizes  5. Essential hypertension Uncontrolled blood pressure Recently took blood pressure medications No regimen change today   Meds ordered this encounter  Medications  . furosemide (LASIX) 40 MG tablet    Sig: Take 1 tablet (40 mg total) by mouth 2 (two) times daily.    Dispense:  60 tablet    Refill:  3  . losartan (COZAAR) 50 MG tablet    Sig: Take 1 tablet (50 mg total) by mouth daily.    Dispense:  30 tablet    Refill:  3    Follow-up: Return in about 3 months  (around 05/27/2019) for medical conditions.       Charlott Rakes, MD, FAAFP. [facility] and [place], [place]   02/24/2019, 3:45 PM

## 2019-02-24 NOTE — Progress Notes (Signed)
Patient is having SOB.  Patient just took medications 2 hours ago.

## 2019-02-24 NOTE — Patient Instructions (Signed)

## 2019-02-25 LAB — CBC WITH DIFFERENTIAL/PLATELET
Basophils Absolute: 0.1 10*3/uL (ref 0.0–0.2)
Basos: 1 %
EOS (ABSOLUTE): 0.4 10*3/uL (ref 0.0–0.4)
Eos: 4 %
Hematocrit: 40.5 % (ref 37.5–51.0)
Hemoglobin: 11.5 g/dL — ABNORMAL LOW (ref 13.0–17.7)
Immature Grans (Abs): 0 10*3/uL (ref 0.0–0.1)
Immature Granulocytes: 0 %
Lymphocytes Absolute: 1.9 10*3/uL (ref 0.7–3.1)
Lymphs: 20 %
MCH: 21.8 pg — ABNORMAL LOW (ref 26.6–33.0)
MCHC: 28.4 g/dL — ABNORMAL LOW (ref 31.5–35.7)
MCV: 77 fL — ABNORMAL LOW (ref 79–97)
Monocytes Absolute: 0.8 10*3/uL (ref 0.1–0.9)
Monocytes: 9 %
Neutrophils Absolute: 6.1 10*3/uL (ref 1.4–7.0)
Neutrophils: 66 %
Platelets: 382 10*3/uL (ref 150–450)
RBC: 5.27 x10E6/uL (ref 4.14–5.80)
RDW: 19.4 % — ABNORMAL HIGH (ref 11.6–15.4)
WBC: 9.2 10*3/uL (ref 3.4–10.8)

## 2019-02-25 LAB — BRAIN NATRIURETIC PEPTIDE: BNP: 144.2 pg/mL — ABNORMAL HIGH (ref 0.0–100.0)

## 2019-03-24 ENCOUNTER — Encounter (HOSPITAL_BASED_OUTPATIENT_CLINIC_OR_DEPARTMENT_OTHER): Payer: Medicaid Other

## 2019-03-25 ENCOUNTER — Other Ambulatory Visit: Payer: Self-pay | Admitting: Physician Assistant

## 2019-03-25 ENCOUNTER — Other Ambulatory Visit: Payer: Self-pay | Admitting: Family Medicine

## 2019-03-25 DIAGNOSIS — D62 Acute posthemorrhagic anemia: Secondary | ICD-10-CM

## 2019-03-25 DIAGNOSIS — M1A0790 Idiopathic chronic gout, unspecified ankle and foot, without tophus (tophi): Secondary | ICD-10-CM

## 2019-03-25 MED FILL — CARVEDILOL 25 MG TABLET: 25 | 30 days supply | Qty: 60 | Fill #3

## 2019-03-25 MED FILL — ALLOPURINOL 300 MG TAB: 300 | 30 days supply | Qty: 30 | Fill #3

## 2019-03-25 MED FILL — LOSARTAN POTASSIUM 50 MG TA: 50 | 30 days supply | Qty: 30 | Fill #6

## 2019-03-26 MED FILL — PANTOPRAZOLE SOD DR 40 MG T: 40 | 30 days supply | Qty: 30 | Fill #0

## 2019-04-28 NOTE — Telephone Encounter (Signed)
Patient is scheduled for CPAP Titration on 8/29. He is scheduled for COVID screening on 8/27/ at 11:30 am prior to titration.  Patient understands his titration study will be done at [facility] sleep lab. Patient understands he will receive a letter in a week or so detailing appointment, date, time, and location. Patient understands to call if he does not receive the letter  in a timely manner. Patient agrees with treatment and thanked me for call.

## 2019-04-29 ENCOUNTER — Telehealth: Payer: Self-pay

## 2019-04-29 NOTE — Telephone Encounter (Signed)
Received patient assistance forms for patient. Contacted patient and he told me received the forms in the mail for his Xarelto and wanted to know if we would fill them out. He stated it would be easier for him to complete his portion and bring the paperwork to the office for [place] to complete and fax. I advised patient to complete paperwork and leave at the front for [place]. He voiced understanding.

## 2019-04-30 MED FILL — PANTOPRAZOLE SOD DR 40 MG T: 40 | 30 days supply | Qty: 30 | Fill #1

## 2019-04-30 MED FILL — ALLOPURINOL 300 MG TAB: 300 | 30 days supply | Qty: 30 | Fill #4

## 2019-04-30 MED FILL — LOSARTAN POTASSIUM 50 MG TA: 50 | 30 days supply | Qty: 30 | Fill #7

## 2019-04-30 MED FILL — CARVEDILOL 25 MG TABLET: 25 | 30 days supply | Qty: 60 | Fill #4

## 2019-04-30 MED FILL — FUROSEMIDE 40 MG TAB: 40 | 30 days supply | Qty: 60 | Fill #0

## 2019-05-01 ENCOUNTER — Other Ambulatory Visit (HOSPITAL_COMMUNITY): Payer: Medicaid Other

## 2019-05-03 ENCOUNTER — Encounter (HOSPITAL_BASED_OUTPATIENT_CLINIC_OR_DEPARTMENT_OTHER): Payer: Medicaid Other | Admitting: Cardiovascular Disease

## 2019-05-15 ENCOUNTER — Other Ambulatory Visit (HOSPITAL_COMMUNITY): Payer: Medicaid Other

## 2019-05-17 ENCOUNTER — Encounter (HOSPITAL_BASED_OUTPATIENT_CLINIC_OR_DEPARTMENT_OTHER): Payer: Medicaid Other | Admitting: Cardiovascular Disease

## 2019-05-26 ENCOUNTER — Other Ambulatory Visit (HOSPITAL_COMMUNITY)
Admission: RE | Admit: 2019-05-26 | Discharge: 2019-05-26 | Disposition: A | Discharge status: Home / Self Care | Payer: Medicaid Other | Source: Ambulatory Visit | Attending: Cardiovascular Disease | Admitting: Cardiovascular Disease

## 2019-05-26 DIAGNOSIS — Z01812 Encounter for preprocedural laboratory examination: Secondary | ICD-10-CM | POA: Insufficient documentation

## 2019-05-26 DIAGNOSIS — Z20828 Contact with and (suspected) exposure to other viral communicable diseases: Secondary | ICD-10-CM | POA: Insufficient documentation

## 2019-05-27 MED FILL — PANTOPRAZOLE SOD DR 40 MG T: 40 | 30 days supply | Qty: 30 | Fill #2

## 2019-05-28 ENCOUNTER — Ambulatory Visit: Payer: Medicaid Other | Attending: Family Medicine | Admitting: Family Medicine

## 2019-05-28 ENCOUNTER — Encounter: Payer: Self-pay | Admitting: Family Medicine

## 2019-05-28 DIAGNOSIS — I4891 Unspecified atrial fibrillation: Secondary | ICD-10-CM | POA: Diagnosis not present

## 2019-05-28 DIAGNOSIS — M1A0790 Idiopathic chronic gout, unspecified ankle and foot, without tophus (tophi): Secondary | ICD-10-CM | POA: Diagnosis not present

## 2019-05-28 DIAGNOSIS — I10 Essential (primary) hypertension: Secondary | ICD-10-CM

## 2019-05-28 DIAGNOSIS — I5042 Chronic combined systolic (congestive) and diastolic (congestive) heart failure: Secondary | ICD-10-CM

## 2019-05-28 DIAGNOSIS — I110 Hypertensive heart disease with heart failure: Secondary | ICD-10-CM

## 2019-05-28 DIAGNOSIS — D62 Acute posthemorrhagic anemia: Secondary | ICD-10-CM | POA: Diagnosis not present

## 2019-05-28 LAB — NOVEL CORONAVIRUS, NAA (HOSP ORDER, SEND-OUT TO REF LAB; TAT 18-24 HRS): SARS-CoV-2, NAA: NOT DETECTED

## 2019-05-28 MED ORDER — LOSARTAN POTASSIUM 50 MG PO TABS: 50.0000 mg | ORAL_TABLET | Freq: Every day | ORAL | 6 refills | Status: DC

## 2019-05-28 MED ORDER — RIVAROXABAN 20 MG PO TABS: 20.0000 mg | ORAL_TABLET | Freq: Every day | ORAL | 6 refills | Status: DC

## 2019-05-28 MED ORDER — PANTOPRAZOLE SODIUM 40 MG PO TBEC: 40.0000 mg | DELAYED_RELEASE_TABLET | Freq: Every day | ORAL | 6 refills | Status: DC

## 2019-05-28 MED ORDER — ALLOPURINOL 300 MG PO TABS: 300.0000 mg | ORAL_TABLET | Freq: Every day | ORAL | 6 refills | Status: DC

## 2019-05-28 MED ORDER — FUROSEMIDE 40 MG PO TABS: 40.0000 mg | ORAL_TABLET | Freq: Two times a day (BID) | ORAL | 6 refills | Status: DC

## 2019-05-28 MED FILL — FUROSEMIDE 40 MG TAB: 40 | 30 days supply | Qty: 60 | Fill #0

## 2019-05-28 NOTE — Progress Notes (Signed)
Patient has been called and DOB has been verified. Patient has been screened and transferred to PCP to start phone visit.     

## 2019-05-28 NOTE — Progress Notes (Signed)
Virtual Visit via Telephone Note  I connected with Joe Blake, on 05/28/2019 at 8:52 AM by telephone due to the COVID-19 pandemic and verified that I am speaking with the correct person using two identifiers.   Consent: I discussed the limitations, risks, security and privacy concerns of performing an evaluation and management service by telephone and the availability of in person appointments. I also discussed with the patient that there may be a patient responsible charge related to this service. The patient expressed understanding and agreed to proceed.   Location of Patient: Home  Location of Provider: Clinic   Persons participating in Telemedicine visit: Jimmye Maule Farrington-CMA Dr. Felecia Shelling     History of Present Illness: Joe Blake is a 53 year old male with history of Prediabetes (A1c 5.6) hypertension, gout, congestive heart failure (EF 30-35% from 05/2018), atrial fibrillation with RVR/atrial flutter status post DCCV on 03/2018 (currently on anticoagulation with Xarelto) here for follow-up visit.  He was supposed to follow up with GI for an EGD due to symptomatic anemia for which he was hospitalized after an upper GI bleed in 08/2018 but this was postponed due to the pandemic. Denies hematochezia, abdominal pain. His blood pressures have been 130-135/80. Denies recent Gout flares. He is concerned about his weight which he has been trying to lose for a while.  Can't walk much because he keeps stopping to rest. He recently got signed up for [employer].  Yet to see cardiology for follow-up but denies chest pain, pedal edema, palpitation but endorses dyspnea on mild exertion.  Past Medical History:  Diagnosis Date  . Acute systolic HF (heart failure) ([place])   . Atrial fibrillation ([place])   . Diabetes mellitus without complication ([place])   . Hypertension   . Morbid obesity ([place])    No Known Allergies  Current Outpatient Medications on File Prior to Visit   Medication Sig Dispense Refill  . allopurinol (ZYLOPRIM) 300 MG tablet Take 1 tablet (300 mg total) by mouth daily. 30 tablet 6  . carvedilol (COREG) 25 MG tablet Take 1 tablet (25 mg total) by mouth 2 (two) times daily with a meal. 180 tablet 3  . COLCRYS 0.6 MG tablet TAKE 1 TABLET (0.6 MG TOTAL) BY MOUTH ONCE AS NEEDED (TAKE 1.2MG  AND THEN 0.6MG  1 HOUR LATER IF STILL HAVING SYMPTOMS). 30 tablet 2  . furosemide (LASIX) 40 MG tablet Take 1 tablet (40 mg total) by mouth 2 (two) times daily. 60 tablet 3  . pantoprazole (PROTONIX) 40 MG tablet TAKE 1 TABLET (40 MG TOTAL) BY MOUTH DAILY. 30 tablet 2  . rivaroxaban (XARELTO) 20 MG TABS tablet Take 1 tablet (20 mg total) by mouth daily with supper. 30 tablet 6  . losartan (COZAAR) 50 MG tablet Take 1 tablet (50 mg total) by mouth daily. 30 tablet 3   No current facility-administered medications on file prior to visit.     Observations/Objective: Awake, alert, oriented x3 Not in acute distress  CMP Latest Ref Rng & Units 11/21/2018 11/20/2018 08/15/2018  Glucose 65 - 99 mg/dL 133(H) 105(H) 147(H)  BUN 6 - 24 mg/dL 18 20 20   Creatinine 0.76 - 1.27 mg/dL 1.37(H) 1.41(H) 1.12  Sodium 134 - 144 mmol/L 144 142 140  Potassium 3.5 - 5.2 mmol/L 4.5 4.4 3.6  Chloride 96 - 106 mmol/L 98 102 108  CO2 20 - 29 mmol/L 23 26 24   Calcium 8.7 - 10.2 mg/dL 9.6 9.1 8.6(L)  Total Protein 6.0 - 8.5 g/dL -  6.6 -  Total Bilirubin 0.0 - 1.2 mg/dL - 0.2 -  Alkaline Phos 39 - 117 IU/L - 94 -  AST 0 - 40 IU/L - 14 -  ALT 0 - 44 IU/L - 9 -    Lipid Panel  No results found for: CHOL, TRIG, HDL, CHOLHDL, VLDL, LDLCALC, LDLDIRECT  CBC Latest Ref Rng & Units 02/24/2019 11/20/2018 08/19/2018  WBC 3.4 - 10.8 x10E3/uL 9.2 9.2 7.2  Hemoglobin 13.0 - 17.7 g/dL 11.5(L) 9.1(L) 8.3(L)  Hematocrit 37.5 - 51.0 % 40.5 32.8(L) 26.1(L)  Platelets 150 - 450 x10E3/uL 382 425 409      Assessment and Plan: 1. Idiopathic chronic gout of foot without tophus, unspecified  laterality Stable - allopurinol (ZYLOPRIM) 300 MG tablet; Take 1 tablet (300 mg total) by mouth daily.  Dispense: 30 tablet; Refill: 6  2. Acute blood loss anemia Last hemoglobin was 11.5 Thought to be of GI etiology He is asymptomatic at this time Encouraged to schedule appointment with GI for endoscopy - pantoprazole (PROTONIX) 40 MG tablet; Take 1 tablet (40 mg total) by mouth daily.  Dispense: 30 tablet; Refill: 6  3. Hypertensive heart disease with chronic combined systolic and diastolic congestive heart failure (HCC) EF of 30 to 35% NYHA III Continue Lasix, beta-blocker, ARB Fluid restriction, low-sodium cardiac diet - furosemide (LASIX) 40 MG tablet; Take 1 tablet (40 mg total) by mouth 2 (two) times daily.  Dispense: 60 tablet; Refill: 6  4. Atrial fibrillation with RVR (HCC) Stable Follow-up with cardiology - rivaroxaban (XARELTO) 20 MG TABS tablet; Take 1 tablet (20 mg total) by mouth daily with supper.  Dispense: 30 tablet; Refill: 6  5. Essential hypertension Controlled Counseled on blood pressure goal of less than 130/80, low-sodium, DASH diet, medication compliance, 150 minutes of moderate intensity exercise per week. Discussed medication compliance, adverse effects. - losartan (COZAAR) 50 MG tablet; Take 1 tablet (50 mg total) by mouth daily.  Dispense: 30 tablet; Refill: 6   Follow Up Instructions: 3 months-call for an appointment   I discussed the assessment and treatment plan with the patient. The patient was provided an opportunity to ask questions and all were answered. The patient agreed with the plan and demonstrated an understanding of the instructions.   The patient was advised to call back or seek an in-person evaluation if the symptoms worsen or if the condition fails to improve as anticipated.     I provided 21 minutes total of non-face-to-face time during this encounter including median intraservice time, reviewing previous notes, labs, imaging,  medications, management and patient verbalized understanding.     Charlott Rakes, MD, FAAFP. [facility] and [place], [place]   05/28/2019, 8:52 AM

## 2019-05-29 ENCOUNTER — Other Ambulatory Visit: Payer: Self-pay

## 2019-05-29 ENCOUNTER — Ambulatory Visit (HOSPITAL_BASED_OUTPATIENT_CLINIC_OR_DEPARTMENT_OTHER): Payer: Medicaid Other | Attending: Cardiovascular Disease | Admitting: Cardiovascular Disease

## 2019-05-29 DIAGNOSIS — Z7901 Long term (current) use of anticoagulants: Secondary | ICD-10-CM | POA: Diagnosis not present

## 2019-05-29 DIAGNOSIS — I493 Ventricular premature depolarization: Secondary | ICD-10-CM | POA: Insufficient documentation

## 2019-05-29 DIAGNOSIS — G4733 Obstructive sleep apnea (adult) (pediatric): Secondary | ICD-10-CM | POA: Insufficient documentation

## 2019-05-29 DIAGNOSIS — I4819 Other persistent atrial fibrillation: Secondary | ICD-10-CM

## 2019-05-29 DIAGNOSIS — Z79899 Other long term (current) drug therapy: Secondary | ICD-10-CM | POA: Diagnosis not present

## 2019-06-09 ENCOUNTER — Encounter (HOSPITAL_BASED_OUTPATIENT_CLINIC_OR_DEPARTMENT_OTHER): Payer: Self-pay | Admitting: Cardiovascular Disease

## 2019-06-09 ENCOUNTER — Encounter: Payer: Self-pay | Admitting: Family Medicine

## 2019-06-09 NOTE — Procedures (Signed)
Patient Name: Joe Blake, Joe Blake Date: 05/29/2019 Gender: Male D.O.B: 04/26/1966 Age (years): 66 Referring Provider: Shelva Majestic MD, ABSM Height (inches): 73 Interpreting Physician: Shelva Majestic MD, ABSM Weight (lbs): 360 RPSGT: Zadie Rhine BMI: 21 MRN: [id number] Neck Size: 18.50  CLINICAL INFORMATION The patient is referred for a BiPAP titration to treat sleep apnea.  Date of HST: 07/28/2018:  AHI 53.2/h; O2 saturation nadir 60% with 133 minutes < 89%.  SLEEP STUDY TECHNIQUE As per the AASM Manual for the Scoring of Sleep and Associated Events v2.3 (April 2016) with a hypopnea requiring 4% desaturations.  The channels recorded and monitored were frontal, central and occipital EEG, electrooculogram (EOG), submentalis EMG (chin), nasal and oral airflow, thoracic and abdominal wall motion, anterior tibialis EMG, snore microphone, electrocardiogram, and pulse oximetry. Bilevel positive airway pressure (BPAP) was initiated at the beginning of the study and titrated to treat sleep-disordered breathing.  MEDICATIONS  allopurinol (ZYLOPRIM) 300 MG tablet    carvedilol (COREG) 25 MG tablet    COLCRYS 0.6 MG tablet    furosemide (LASIX) 40 MG tablet    losartan (COZAAR) 50 MG tablet    pantoprazole (PROTONIX) 40 MG tablet    rivaroxaban (XARELTO) 20 MG TABS tablet    Medications self-administered by patient taken the night of the study : N/A  RESPIRATORY PARAMETERS Optimal IPAP Pressure (cm): 26 AHI at Optimal Pressure (/hr) 0.0 Optimal EPAP Pressure (cm): 22   Overall Minimal O2 (%): 75.0 Minimal O2 at Optimal Pressure (%): 90.0  SLEEP ARCHITECTURE Start Time: 10:22:32 PM Stop Time: 4:59:20 AM Total Time (min): 396.8 Total Sleep Time (min): 388 Sleep Latency (min): 7.2 Sleep Efficiency (%): 97.8% REM Latency (min): 48.0 WASO (min): 1.6 Stage N1 (%): 0.8% Stage N2 (%): 50.4% Stage N3 (%): 12.9% Stage R (%): 36 Supine (%): 61.23 Arousal Index (/hr): 8.4   CARDIAC  DATA The 2 lead EKG demonstrated sinus rhythm. The mean heart rate was 92.4 beats per minute. Other EKG findings include: Atrial Fibrillation, PVCs.  LEG MOVEMENT DATA The total Periodic Limb Movements of Sleep (PLMS) were 0. The PLMS index was 0.0. A PLMS index of <15 is considered normal in adults.  IMPRESSIONS - CPAP was initiated at 7 cm and titrated to 21 cm. Due to continued events BiPAP was initiated at 23/19 and was titrated to 26/22. AHI at 25/21 was 10.7/h and at 26/22  was 0 with O2 nadir at 90%. - Central sleep apnea was not noted during this titration (CAI = 0.0/h). - Severe oxygen desaturations to a nadir of 75% at 15 cm CPAP pressure. - No snoring was audible during this study. - 2-lead EKG demonstrated: Atrial Fibrillation, PVCs - Clinically significant periodic limb movements were not noted during this study. Arousals associated with PLMs were rare.  DIAGNOSIS - Obstructive Sleep Apnea (327.23 [G47.33 ICD-10])  RECOMMENDATIONS - Recommend an initial trial of BiPAP therapy with EPR at 26/22 cm H2O with heated humidification. A Large size Fisher[employer]Paykel Full Face Mask Simplus mask was used for the titration. - Effort should be made to optimize nasal and oropharyngeal patency. - Avoid alcohol, sedatives and other CNS depressants that may worsen sleep apnea and disrupt normal sleep architecture. - Sleep hygiene should be reviewed to assess factors that may improve sleep quality. - Weight management and regular exercise should be initiated or continued. - Recommend a download in 30 days and sleep clinic evaluation after 4 weeks of therapy.  [Electronically signed] 06/09/2019 09:52 AM  Shelva Majestic  MD, Tomasa Hose Diplomate, American Board of Sleep Medicine   NPI: [id number] [place] PH: [phone]   FX: [phone] [place]

## 2019-06-10 ENCOUNTER — Telehealth: Payer: Self-pay

## 2019-06-10 MED FILL — FUROSEMIDE 40 MG TAB: 40 | 90 days supply | Qty: 180 | Fill #1

## 2019-06-10 MED FILL — PANTOPRAZOLE SOD DR 40 MG T: 40 | 90 days supply | Qty: 90 | Fill #0

## 2019-06-10 MED FILL — ALLOPURINOL 300 MG TAB: 300 | 90 days supply | Qty: 90 | Fill #0

## 2019-06-10 MED FILL — LOSARTAN POTASSIUM 50 MG TA: 50 | 90 days supply | Qty: 90 | Fill #0

## 2019-06-10 MED FILL — XARELTO 20 MG TABLET: 20 | 90 days supply | Qty: 90 | Fill #0

## 2019-06-10 NOTE — Telephone Encounter (Signed)
Called patient to let him know I recieved his forms he dropped off and I have faxed over the completed forms to patient assistance and will contact him once I hear back. He voiced understanding. Copy of forms sent to be scanned into patients chart.

## 2019-06-10 NOTE — Progress Notes (Signed)
Patient notified sleep study has been completed. Orders for BIPAP has been sent to [facility].

## 2019-06-26 ENCOUNTER — Encounter: Payer: Self-pay | Admitting: Cardiology

## 2019-06-26 ENCOUNTER — Other Ambulatory Visit: Payer: Self-pay

## 2019-06-26 ENCOUNTER — Ambulatory Visit: Payer: Medicaid Other | Admitting: Cardiology

## 2019-06-26 VITALS — BP 110/70 | HR 81 | Ht 73.0 in | Wt 388.8 lb

## 2019-06-26 DIAGNOSIS — N289 Disorder of kidney and ureter, unspecified: Secondary | ICD-10-CM

## 2019-06-26 DIAGNOSIS — I5042 Chronic combined systolic (congestive) and diastolic (congestive) heart failure: Secondary | ICD-10-CM

## 2019-06-26 DIAGNOSIS — I110 Hypertensive heart disease with heart failure: Secondary | ICD-10-CM | POA: Diagnosis not present

## 2019-06-26 MED ORDER — FUROSEMIDE 40 MG PO TABS: 40.0000 mg | ORAL_TABLET | Freq: Every day | ORAL | 6 refills | Status: DC

## 2019-06-26 NOTE — Progress Notes (Signed)
Cardiology Office Note:    Date:  06/26/2019   ID:  Joe Blake, DOB Jun 13, 1966, MRN [id number]  PCP:  Joe Rakes, MD  Cardiologist:  Joe Breeding, MD  Electrophysiologist:  None   Referring MD: Joe Rakes, MD   Increased DOe  History of Present Illness:    Joe Blake is a 53 y.o. male with a hx of presumed NICM, PAF, morbid obesity with a BMI of 50, OSA documented on sleep study Sept 2020 (not yet on BiPap), and past GI bleed Dec 2019. He presents today for routine follow up. He was quite SOB when he came in the office.  His weight is significantly up- 388 lbs from 360 lbs in June. His PCP did suggest he increase his Lasix to 40 mg BID but he admitted to me he is only taking it QD because of frequent urination interfering with his new job.  He has started a [employer]. He knows to avoid salt and has been compliant with his medications-except for the BID Lasix.    Past Medical History:  Diagnosis Date  . Acute systolic HF (heart failure) ([place])   . Atrial fibrillation ([place])   . Diabetes mellitus without complication ([place])   . Hypertension   . Morbid obesity ([place])     Past Surgical History:  Procedure Laterality Date  . ABDOMINAL SURGERY    . APPENDECTOMY     at 53 years old  . BIOPSY  08/11/2018   Procedure: BIOPSY;  Surgeon: Laurence Spates, MD;  Location: [facility] ENDOSCOPY;  Service: Endoscopy;;  . CARDIOVERSION N/A 03/18/2018   Procedure: CARDIOVERSION;  Surgeon: Joe Latch, MD;  Location: [facility] ENDOSCOPY;  Service: Cardiovascular;  Laterality: N/A;  . ESOPHAGOGASTRODUODENOSCOPY (EGD) WITH PROPOFOL N/A 08/11/2018   Procedure: ESOPHAGOGASTRODUODENOSCOPY (EGD) WITH PROPOFOL;  Surgeon: Laurence Spates, MD;  Location: [facility] ENDOSCOPY;  Service: Endoscopy;  Laterality: N/A;    Current Medications: Current Meds  Medication Sig  . allopurinol (ZYLOPRIM) 300 MG tablet Take 1 tablet (300 mg total) by mouth daily.  . carvedilol (COREG) 25 MG tablet Take 1 tablet  (25 mg total) by mouth 2 (two) times daily with a meal.  . COLCRYS 0.6 MG tablet TAKE 1 TABLET (0.6 MG TOTAL) BY MOUTH ONCE AS NEEDED (TAKE 1.2MG  AND THEN 0.6MG  1 HOUR LATER IF STILL HAVING SYMPTOMS).  . furosemide (LASIX) 40 MG tablet Take 1 tablet (40 mg total) by mouth 2 (two) times daily.  Marland Kitchen losartan (COZAAR) 50 MG tablet Take 1 tablet (50 mg total) by mouth daily.  . pantoprazole (PROTONIX) 40 MG tablet Take 1 tablet (40 mg total) by mouth daily.  . rivaroxaban (XARELTO) 20 MG TABS tablet Take 1 tablet (20 mg total) by mouth daily with supper.     Allergies:   Patient has no known allergies.   Social History   Socioeconomic History  . Marital status: Single    Spouse name: Not on file  . Number of children: Not on file  . Years of education: Not on file  . Highest education level: Not on file  Occupational History  . Not on file  Social Needs  . Financial resource strain: Somewhat hard  . Food insecurity    Worry: Patient refused    Inability: Patient refused  . Transportation needs    Medical: No    Non-medical: No  Tobacco Use  . Smoking status: Never Smoker  . Smokeless tobacco: Never Used  Substance and Sexual Activity  .  Alcohol use: No    Frequency: Never  . Drug use: No  . Sexual activity: Yes    Partners: Female  Lifestyle  . Physical activity    Days per week: Patient refused    Minutes per session: Patient refused  . Stress: Only a little  Relationships  . Social Herbalist on phone: More than three times a week    Gets together: Twice a week    Attends religious service: Patient refused    Active member of club or organization: No    Attends meetings of clubs or organizations: Never    Relationship status: Never married  Other Topics Concern  . Not on file  Social History Narrative  . Not on file     Family History: The patient's family history includes Heart attack in his paternal grandfather; Hyperlipidemia in his mother;  Hypertension in his mother.  ROS:   Please see the history of present illness.  Fall at home recently- broke his tooth (tripped)    All other systems reviewed and are negative.  EKGs/Labs/Other Studies Reviewed:    The following studies were reviewed today: Sleep study 06/02/2019  EKG:  EKG is ordered today.  The ekg ordered today demonstrates AF with CVR  Recent Labs: 11/20/2018: ALT 9 11/21/2018: BUN 18; Creatinine, Ser 1.37; Potassium 4.5; Sodium 144 02/24/2019: BNP 144.2; Hemoglobin 11.5; Platelets 382  Recent Lipid Panel No results found for: CHOL, TRIG, HDL, CHOLHDL, VLDL, LDLCALC, LDLDIRECT  Physical Exam:    VS:  BP 110/70   Pulse 81   Ht 6\' 1"  (1.854 m)   Wt (!) 388 lb 12.8 oz (176.4 kg)   SpO2 96%   BMI 51.30 kg/m     Wt Readings from Last 3 Encounters:  06/26/19 (!) 388 lb 12.8 oz (176.4 kg)  05/29/19 (!) 360 lb (163.3 kg)  02/24/19 (!) 381 lb 12.8 oz (173.2 kg)     GEN: Morbidly obese Caucasian male, no acute distress, but SOB with any activity HEENT: Normal NECK: No JVD; No carotid bruits LYMPHATICS: No lymphadenopathy CARDIAC:irregularly irregular, no murmurs, rubs, gallops RESPIRATORY:  Clear to auscultation without rales, wheezing or rhonchi  ABDOMEN: Soft, non-tender, non-distended MUSCULOSKELETAL:  1+ chronic LE edema, No deformity  SKIN: Warm and dry NEUROLOGIC:  Alert and oriented x 3 PSYCHIATRIC:  Normal affect   ASSESSMENT:    Acute on chronic combined CHF- I was ready to admit him but we'll try increasing his diuretic first.  Sleep apnea- BiPap recommended and [place] contacted but the patient has not heard anything yet.  I will ask Mariann Laster to look into this- he needs BiPap ASAP.  Persistent atrial fibrillation (HCC) AF with CVR-   Anticoagulated CHADS VASC=3, on Xarelto  GI bleed H/O GI bleed- PUD- Dec 2019.   HTN (hypertension) controlled  CRI-3- Last SCr 1.37 on 11/21/2018.   Morbid obesity- BMI  51   PLAN:    Increase Lasix to 80 mg daily for 5 days, then go back to 40 mg daily.  I'll check labs today and see him back in a couple of weeks. Check BMP, BNP. CBC today.    Medication Adjustments/Labs and Tests Ordered: Current medicines are reviewed at length with the patient today.  Concerns regarding medicines are outlined above.  No orders of the defined types were placed in this encounter.  No orders of the defined types were placed in this encounter.   There are no Patient Instructions on  file for this visit.   Angelena Form, PA-C  06/26/2019 4:03 PM    [facility]

## 2019-06-26 NOTE — Patient Instructions (Signed)
Medication Instructions:  INCREASE Lasix to 80mg  or two 40mg  tablets at one time for 5 days then go back to 40mg  once a day *If you need a refill on your cardiac medications before your next appointment, please call your pharmacy*  Lab Work: Your physician recommends that you return for lab work in: TODAY-BMET, BNP, CBC If you have labs (blood work) drawn today and your tests are completely normal, you will receive your results only by: Marland Kitchen MyChart Message (if you have MyChart) OR . A paper copy in the mail If you have any lab test that is abnormal or we need to change your treatment, we will call you to review the results.  Testing/Procedures: NONE   Follow-Up: At [facility], you and your health needs are our priority.  As part of our continuing mission to provide you with exceptional heart care, we have created designated Provider Care Teams.  These Care Teams include your primary Cardiologist (physician) and Advanced Practice Providers (APPs -  Physician Assistants and Nurse Practitioners) who all work together to provide you with the care you need, when you need it.  Your next appointment:   3-4 WEEKS   The format for your next appointment:   In Person  Provider:   Kerin Ransom, PA-C  Other Instructions

## 2019-06-27 LAB — CBC WITH DIFFERENTIAL/PLATELET
Basophils Absolute: 0.1 10*3/uL (ref 0.0–0.2)
Basos: 1 %
EOS (ABSOLUTE): 0.2 10*3/uL (ref 0.0–0.4)
Eos: 3 %
Hematocrit: 33.7 % — ABNORMAL LOW (ref 37.5–51.0)
Hemoglobin: 9.9 g/dL — ABNORMAL LOW (ref 13.0–17.7)
Immature Grans (Abs): 0 10*3/uL (ref 0.0–0.1)
Immature Granulocytes: 0 %
Lymphocytes Absolute: 1 10*3/uL (ref 0.7–3.1)
Lymphs: 14 %
MCH: 22.5 pg — ABNORMAL LOW (ref 26.6–33.0)
MCHC: 29.4 g/dL — ABNORMAL LOW (ref 31.5–35.7)
MCV: 77 fL — ABNORMAL LOW (ref 79–97)
Monocytes Absolute: 0.6 10*3/uL (ref 0.1–0.9)
Monocytes: 8 %
Neutrophils Absolute: 5.1 10*3/uL (ref 1.4–7.0)
Neutrophils: 74 %
Platelets: 268 10*3/uL (ref 150–450)
RBC: 4.4 x10E6/uL (ref 4.14–5.80)
RDW: 16.2 % — ABNORMAL HIGH (ref 11.6–15.4)
WBC: 6.9 10*3/uL (ref 3.4–10.8)

## 2019-06-27 LAB — BASIC METABOLIC PANEL
BUN/Creatinine Ratio: 13 (ref 9–20)
BUN: 22 mg/dL (ref 6–24)
CO2: 23 mmol/L (ref 20–29)
Calcium: 9 mg/dL (ref 8.7–10.2)
Chloride: 107 mmol/L — ABNORMAL HIGH (ref 96–106)
Creatinine, Ser: 1.63 mg/dL — ABNORMAL HIGH (ref 0.76–1.27)
GFR calc Af Amer: 55 mL/min/{1.73_m2} — ABNORMAL LOW (ref >59)
GFR calc non Af Amer: 48 mL/min/{1.73_m2} — ABNORMAL LOW (ref >59)
Glucose: 106 mg/dL — ABNORMAL HIGH (ref 65–99)
Potassium: 4.4 mmol/L (ref 3.5–5.2)
Sodium: 142 mmol/L (ref 134–144)

## 2019-06-27 LAB — PRO B NATRIURETIC PEPTIDE: NT-Pro BNP: 2899 pg/mL — ABNORMAL HIGH (ref 0–121)

## 2019-07-23 ENCOUNTER — Encounter: Payer: Self-pay | Admitting: Cardiology

## 2019-07-23 ENCOUNTER — Ambulatory Visit: Payer: Medicaid Other | Admitting: Cardiology

## 2019-07-23 ENCOUNTER — Other Ambulatory Visit: Payer: Self-pay

## 2019-07-23 VITALS — BP 128/88 | HR 85 | Temp 96.8°F | Ht 73.0 in | Wt 371.0 lb

## 2019-07-23 DIAGNOSIS — I5021 Acute systolic (congestive) heart failure: Secondary | ICD-10-CM

## 2019-07-23 DIAGNOSIS — G4733 Obstructive sleep apnea (adult) (pediatric): Secondary | ICD-10-CM

## 2019-07-23 DIAGNOSIS — N1831 Chronic kidney disease, stage 3a: Secondary | ICD-10-CM | POA: Diagnosis not present

## 2019-07-23 DIAGNOSIS — Z7901 Long term (current) use of anticoagulants: Secondary | ICD-10-CM

## 2019-07-23 DIAGNOSIS — I4819 Other persistent atrial fibrillation: Secondary | ICD-10-CM | POA: Diagnosis not present

## 2019-07-23 NOTE — Progress Notes (Signed)
Cardiology Office Note:    Date:  07/23/2019   ID:  Joe Blake, DOB [birth year], MRN [id number]  PCP:  Charlott Rakes, MD  Cardiologist:  Minus Breeding, MD  Electrophysiologist:  None   Referring MD: Charlott Rakes, MD   Chief Complaint  Patient presents with  . Follow-up  From office visit 06/26/2019  History of Present Illness:    Joe Blake is a 53 y.o. male with a hx of presumed NICM, PAF, morbid obesity with a BMI of 50, OSA documented on sleep study Sept 2020 (not yet on BiPap), and past GI bleed Dec 2019. He was seen in the office 06/26/2019. He was quite SOB when he came in the office.  His weight was significantly up- 388 lbs from 360 lbs in June. His PCP had suggested he increase his Lasix to 40 mg BID but he admitted to me he is only taking it QD because of frequent urination interfering with his new job.   I increased his Lasix to 80 mg daily for 5 days, he was the  To go back to 40 mg daily.  Labs done 06/26/2019 showed a slight bump in his SCr to 1.63 and a pro BNP of 2889.   He returns today for follow up.  He feels better, less SOB. He tells me he found that Lasix 80 mg alternating with 40 mg seems ed to work best,  He noted re accumilation of fluid on just 40 mg daily.  His weight is down 17 lbs- to 371 lbs. He still hasn't gotten his BiPap- I'm not sure why, he says he was never contacted.   Past Medical History:  Diagnosis Date  . Acute systolic HF (heart failure) ([place])   . Atrial fibrillation ([place])   . Diabetes mellitus without complication ([place])   . Hypertension   . Morbid obesity ([place])     Past Surgical History:  Procedure Laterality Date  . ABDOMINAL SURGERY    . APPENDECTOMY     at 53 years old  . BIOPSY  08/11/2018   Procedure: BIOPSY;  Surgeon: Laurence Spates, MD;  Location: [facility] ENDOSCOPY;  Service: Endoscopy;;  . CARDIOVERSION N/A 03/18/2018   Procedure: CARDIOVERSION;  Surgeon: Skeet Latch, MD;  Location[facility] ENDOSCOPY;  Service:  Cardiovascular;  Laterality: N/A;  . ESOPHAGOGASTRODUODENOSCOPY (EGD) WITH PROPOFOL N/A 08/11/2018   Procedure: ESOPHAGOGASTRODUODENOSCOPY (EGD) WITH PROPOFOL;  Surgeon: Laurence Spates, MD;  Location: [facility] ENDOSCOPY;  Service: Endoscopy;  Laterality: N/A;    Current Medications: Current Meds  Medication Sig  . allopurinol (ZYLOPRIM) 300 MG tablet Take 1 tablet (300 mg total) by mouth daily.  . carvedilol (COREG) 25 MG tablet Take 1 tablet (25 mg total) by mouth 2 (two) times daily with a meal.  . COLCRYS 0.6 MG tablet TAKE 1 TABLET (0.6 MG TOTAL) BY MOUTH ONCE AS NEEDED (TAKE 1.2MG  AND THEN 0.6MG  1 HOUR LATER IF STILL HAVING SYMPTOMS).  . furosemide (LASIX) 40 MG tablet Take 1 tablet (40 mg total) by mouth daily.  Marland Kitchen losartan (COZAAR) 50 MG tablet Take 1 tablet (50 mg total) by mouth daily.  . pantoprazole (PROTONIX) 40 MG tablet Take 1 tablet (40 mg total) by mouth daily.  . rivaroxaban (XARELTO) 20 MG TABS tablet Take 1 tablet (20 mg total) by mouth daily with supper.     Allergies:   Patient has no known allergies.   Social History   Socioeconomic History  . Marital status: Single    Spouse name: Not  on file  . Number of children: Not on file  . Years of education: Not on file  . Highest education level: Not on file  Occupational History  . Not on file  Social Needs  . Financial resource strain: Somewhat hard  . Food insecurity    Worry: Patient refused    Inability: Patient refused  . Transportation needs    Medical: No    Non-medical: No  Tobacco Use  . Smoking status: Never Smoker  . Smokeless tobacco: Never Used  Substance and Sexual Activity  . Alcohol use: No    Frequency: Never  . Drug use: No  . Sexual activity: Yes    Partners: Female  Lifestyle  . Physical activity    Days per week: Patient refused    Minutes per session: Patient refused  . Stress: Only a little  Relationships  . Social Herbalist on phone: More than three times a week    Gets  together: Twice a week    Attends religious service: Patient refused    Active member of club or organization: No    Attends meetings of clubs or organizations: Never    Relationship status: Never married  Other Topics Concern  . Not on file  Social History Narrative  . Not on file     Family History: The patient's family history includes Heart attack in his paternal grandfather; Hyperlipidemia in his mother; Hypertension in his mother.  ROS:   Please see the history of present illness.     All other systems reviewed and are negative.  EKGs/Labs/Other Studies Reviewed:    The following studies were reviewed today: Echo Sept 2019- EF 30-35%  Recent Labs: 11/20/2018: ALT 9 02/24/2019: BNP 144.2 06/26/2019: BUN 22; Creatinine, Ser 1.63; Hemoglobin 9.9; NT-Pro BNP 2,899; Platelets 268; Potassium 4.4; Sodium 142  Recent Lipid Panel No results found for: CHOL, TRIG, HDL, CHOLHDL, VLDL, LDLCALC, LDLDIRECT  Physical Exam:    VS:  BP 128/88   Pulse 85   Temp (!) 96.8 F (36 C) (Temporal)   Ht 6\' 1"  (1.854 m)   Wt (!) 371 lb (168.3 kg)   SpO2 96%   BMI 48.95 kg/m     Wt Readings from Last 3 Encounters:  07/23/19 (!) 371 lb (168.3 kg)  06/26/19 (!) 388 lb 12.8 oz (176.4 kg)  05/29/19 (!) 360 lb (163.3 kg)     GEN: Morbidly obese Caucasian ,male, well developed in no acute distress HEENT: Normal NECK: No JVD; No carotid bruits LYMPHATICS: No lymphadenopathy CARDIAC: irregularly irregular, no murmurs, rubs, gallops RESPIRATORY:  Clear to auscultation without rales, wheezing or rhonchi  ABDOMEN: Soft, non-tender, non-distended MUSCULOSKELETAL:  No edema; No deformity  SKIN: Warm and dry NEUROLOGIC:  Alert and oriented x 3 PSYCHIATRIC:  Normal affect   ASSESSMENT:    Acute on chronic combined CHF- I was ready to admit him when I saw him 06/26/2019 but we decided to try increasing his diuretic first. He is definitely improved today though he may still be 5-10 lbs up.    Sleep apnea- BiPap recommended and [place] contacted but the patient has not heard anything yet.  I will ask Mariann Laster to look into this- he needs BiPap ASAP.  Persistent atrial fibrillation (HCC) AF with CVR-   Anticoagulated CHADS VASC=3, onXarelto  GI bleed H/O GI bleed- PUD- Dec 2019.   HTN (hypertension) controlled  CRI-3- Last SCr 1.63 on 06/26/2019   Morbid obesity- BMI  48.9    PLAN:    OK to continue Lasix 80 mg alternating with 40 mg QOD.  I explained that our goal was to keep him between 360-365 lbs.  Check BMP and BNP today.  I have sent another message to Mariann Laster about this patient BiPap.    Medication Adjustments/Labs and Tests Ordered: Current medicines are reviewed at length with the patient today.  Concerns regarding medicines are outlined above.  No orders of the defined types were placed in this encounter.  No orders of the defined types were placed in this encounter.   There are no Patient Instructions on file for this visit.   Signed, Kerin Ransom, PA-C  07/23/2019 3:22 PM    [facility]

## 2019-07-23 NOTE — Patient Instructions (Addendum)
Medication Instructions:  TAKE Lasix 80mg  every other day rotating with 40mg  once a day YOUR GOAL WEIGHT IS 360-365 ADJUST LASIX ACCORDINGLY *If you need a refill on your cardiac medications before your next appointment, please call your pharmacy*  Lab Work: Your physician recommends that you return for lab work in: TODAY-BMET, BNP If you have labs (blood work) drawn today and your tests are completely normal, you will receive your results only by: Marland Kitchen MyChart Message (if you have MyChart) OR . A paper copy in the mail If you have any lab test that is abnormal or we need to change your treatment, we will call you to review the results.  Testing/Procedures: NONE   Follow-Up: At [facility], you and your health needs are our priority.  As part of our continuing mission to provide you with exceptional heart care, we have created designated Provider Care Teams.  These Care Teams include your primary Cardiologist (physician) and Advanced Practice Providers (APPs -  Physician Assistants and Nurse Practitioners) who all work together to provide you with the care you need, when you need it.  Your next appointment:   6 month(s)  The format for your next appointment:   In Person  Provider:   Kerin Ransom, PA-C   Other Instructions A note has been sent to our sleep coordinator about your bi-pap

## 2019-07-24 ENCOUNTER — Telehealth: Payer: Self-pay | Admitting: *Deleted

## 2019-07-24 LAB — BASIC METABOLIC PANEL
BUN/Creatinine Ratio: 13 (ref 9–20)
BUN: 22 mg/dL (ref 6–24)
CO2: 24 mmol/L (ref 20–29)
Calcium: 9.5 mg/dL (ref 8.7–10.2)
Chloride: 101 mmol/L (ref 96–106)
Creatinine, Ser: 1.65 mg/dL — ABNORMAL HIGH (ref 0.76–1.27)
GFR calc Af Amer: 54 mL/min/{1.73_m2} — ABNORMAL LOW (ref >59)
GFR calc non Af Amer: 47 mL/min/{1.73_m2} — ABNORMAL LOW (ref >59)
Glucose: 117 mg/dL — ABNORMAL HIGH (ref 65–99)
Potassium: 4.6 mmol/L (ref 3.5–5.2)
Sodium: 141 mmol/L (ref 134–144)

## 2019-07-24 LAB — PRO B NATRIURETIC PEPTIDE: NT-Pro BNP: 3686 pg/mL — ABNORMAL HIGH (ref 0–121)

## 2019-07-24 NOTE — Telephone Encounter (Signed)
Received a call from [facility] @ [employer] informing me that Medicaid has denied paying for patient's BIPAP machine. She plans to call them to "fight" their decision. She will call me back with the final decision.

## 2019-10-30 ENCOUNTER — Other Ambulatory Visit: Payer: Self-pay | Admitting: Family Medicine

## 2019-10-30 ENCOUNTER — Encounter: Payer: Self-pay | Admitting: Family Medicine

## 2019-10-30 MED ORDER — FUROSEMIDE 40 MG PO TABS: 40.0000 mg | ORAL_TABLET | Freq: Every day | ORAL | 1 refills | Status: DC

## 2020-01-07 ENCOUNTER — Encounter: Payer: Self-pay | Admitting: Family Medicine

## 2020-01-09 MED FILL — ALLOPURINOL 300 MG TAB: 300 | 90 days supply | Qty: 90 | Fill #1

## 2020-01-09 MED FILL — FUROSEMIDE 40 MG TAB: 40 | 90 days supply | Qty: 90 | Fill #0

## 2020-01-09 MED FILL — PANTOPRAZOLE SOD DR 40 MG T: 40 | 90 days supply | Qty: 90 | Fill #1

## 2020-01-09 MED FILL — XARELTO 20 MG TABLET: 20 | 90 days supply | Qty: 90 | Fill #1

## 2020-01-09 MED FILL — LOSARTAN POTASSIUM 50 MG TA: 50 | 90 days supply | Qty: 90 | Fill #1

## 2020-01-15 ENCOUNTER — Ambulatory Visit: Payer: Medicaid Other | Admitting: Family

## 2020-01-20 ENCOUNTER — Encounter: Payer: Self-pay | Admitting: Family

## 2020-01-20 ENCOUNTER — Ambulatory Visit: Payer: Medicaid Other | Attending: Family | Admitting: Family

## 2020-01-20 ENCOUNTER — Other Ambulatory Visit: Payer: Self-pay

## 2020-01-20 ENCOUNTER — Other Ambulatory Visit: Payer: Self-pay | Admitting: Family

## 2020-01-20 ENCOUNTER — Encounter: Payer: Self-pay | Admitting: Gastroenterology

## 2020-01-20 VITALS — BP 142/96 | HR 107 | Temp 96.3°F | Resp 16 | Wt 383.6 lb

## 2020-01-20 DIAGNOSIS — M1A0790 Idiopathic chronic gout, unspecified ankle and foot, without tophus (tophi): Secondary | ICD-10-CM | POA: Diagnosis not present

## 2020-01-20 DIAGNOSIS — I110 Hypertensive heart disease with heart failure: Secondary | ICD-10-CM | POA: Diagnosis not present

## 2020-01-20 DIAGNOSIS — I5042 Chronic combined systolic (congestive) and diastolic (congestive) heart failure: Secondary | ICD-10-CM | POA: Insufficient documentation

## 2020-01-20 DIAGNOSIS — I4892 Unspecified atrial flutter: Secondary | ICD-10-CM | POA: Insufficient documentation

## 2020-01-20 DIAGNOSIS — I4891 Unspecified atrial fibrillation: Secondary | ICD-10-CM

## 2020-01-20 DIAGNOSIS — Z79899 Other long term (current) drug therapy: Secondary | ICD-10-CM | POA: Insufficient documentation

## 2020-01-20 DIAGNOSIS — N183 Chronic kidney disease, stage 3 unspecified: Secondary | ICD-10-CM | POA: Insufficient documentation

## 2020-01-20 DIAGNOSIS — Z7901 Long term (current) use of anticoagulants: Secondary | ICD-10-CM | POA: Insufficient documentation

## 2020-01-20 DIAGNOSIS — I447 Left bundle-branch block, unspecified: Secondary | ICD-10-CM | POA: Diagnosis not present

## 2020-01-20 DIAGNOSIS — D229 Melanocytic nevi, unspecified: Secondary | ICD-10-CM

## 2020-01-20 DIAGNOSIS — G4730 Sleep apnea, unspecified: Secondary | ICD-10-CM | POA: Insufficient documentation

## 2020-01-20 DIAGNOSIS — I10 Essential (primary) hypertension: Secondary | ICD-10-CM | POA: Diagnosis not present

## 2020-01-20 DIAGNOSIS — R7303 Prediabetes: Secondary | ICD-10-CM | POA: Insufficient documentation

## 2020-01-20 DIAGNOSIS — Z8249 Family history of ischemic heart disease and other diseases of the circulatory system: Secondary | ICD-10-CM | POA: Insufficient documentation

## 2020-01-20 DIAGNOSIS — D62 Acute posthemorrhagic anemia: Secondary | ICD-10-CM | POA: Insufficient documentation

## 2020-01-20 DIAGNOSIS — Z1211 Encounter for screening for malignant neoplasm of colon: Secondary | ICD-10-CM

## 2020-01-20 DIAGNOSIS — Z131 Encounter for screening for diabetes mellitus: Secondary | ICD-10-CM

## 2020-01-20 DIAGNOSIS — I130 Hypertensive heart and chronic kidney disease with heart failure and stage 1 through stage 4 chronic kidney disease, or unspecified chronic kidney disease: Secondary | ICD-10-CM | POA: Diagnosis not present

## 2020-01-20 MED ORDER — ALLOPURINOL 300 MG PO TABS: 300.0000 mg | ORAL_TABLET | Freq: Every day | ORAL | 2 refills | Status: DC

## 2020-01-20 MED ORDER — CARVEDILOL 25 MG PO TABS: 25.0000 mg | ORAL_TABLET | Freq: Two times a day (BID) | ORAL | 2 refills | Status: DC

## 2020-01-20 MED ORDER — PANTOPRAZOLE SODIUM 40 MG PO TBEC: 40.0000 mg | DELAYED_RELEASE_TABLET | Freq: Every day | ORAL | 2 refills | Status: DC

## 2020-01-20 MED ORDER — LOSARTAN POTASSIUM 50 MG PO TABS: 50.0000 mg | ORAL_TABLET | Freq: Every day | ORAL | 2 refills | Status: DC

## 2020-01-20 MED ORDER — FUROSEMIDE 40 MG PO TABS: 40.0000 mg | ORAL_TABLET | Freq: Every day | ORAL | 2 refills | Status: DC

## 2020-01-20 MED ORDER — RIVAROXABAN 20 MG PO TABS: 20.0000 mg | ORAL_TABLET | Freq: Every day | ORAL | 2 refills | Status: DC

## 2020-01-20 MED FILL — CARVEDILOL 25 MG TABLET: 25 | 90 days supply | Qty: 180 | Fill #0

## 2020-01-20 NOTE — Progress Notes (Addendum)
Patient ID: Joe Blake, male    DOB: [birth year]  MRN: [id number]  CC: Medication refills  Subjective: Joe Blake is a 54 y.o. male with history of chronic systolic congestive heart failure, persistent atrial fibrillation, chronic kidney disease stage 3 GFR 30-59, and morbid obesity who presents for medication refills.  1. HYPERTENSION FOLLOW-UP: Currently taking: see medication list Med Adherence: []  Yes    [x]  No Medication side effects: [x]  Yes    []  No Adherence with salt restriction: [x]  Yes    []  No Exercise: Yes [x]  No []   Home Monitoring?: []  Yes    [x]  No Monitoring Frequency: []  Yes    [x]  No Home BP results range: []  Yes    [x]  No Smoking []  Yes [x]  No SOB? [x]  Yes, sometimes   Chest Pain?: []  Yes    [x]  No Leg swelling?: [x]  Yes, especially during the day but goes away at night Headaches?: []  Yes    [x]  No Dizziness? [x]  Yes, sometimes happened twice since December 2020  Comments: Reports he quit taking the Carvedilol in December 2020 because the medication drains his energy, he feels sore, and cant move when he takes it. Reports it  takes 2 to 3 days to get that medication out of his system. States he prefers not to add an a replacement medication to his regimen. Reports going to cardiology for management and intends to make an appointment soon. Reports he has not taken his blood pressure medication today and he has not eaten anything.  Last visit 05/28/2019 with Dr. Margarita Rana. During that encounter blood pressure was controlled. Losartan was refilled at that time.   2. ANEMIA FOLLOW-UP: Reports he is still taking Pantoprazole as prescribed. Patient stable at this time but still has concerns for decreased hemoglobin levels. Reports he has not followed up with GI for endoscopy as recommended.   Last visit 05/28/2019 with Dr. Margarita Rana. During that encounter anemia considered likely related to GI causes. Patient was asymptomatic at that time. Patient was encouraged to schedule an  appointment with GI for endoscopy.  3. HEART FAILURE FOLLOW-UP: Currently taking:  See med list Taking ACEI/ARB? Yes, taking an ARB/Losartan Taking beta blocker?  No, denies taking Carvedilol since December 2020 related to side effects of medication  Med Adherence:  Endorses taking Losartan as prescribed and not taking Carvedilol. Medication side effects:  is intolerable, reports Carvedilol drains his energy and makes his body feel sore. Reports he has not taken Lasix for the past days because he knew he was coming to his medical appointments and didn't want to keep going to the restroom. Reports he will resume taking Lasix soon. Adherence with salt restriction:  yes Shortness of breath?  yes, sometimes Leg swelling?  yes, sometimes especially during the day but gets better when resting at night Last echocardiogram:  05/30/2018 Last ejection fraction:  30 - 35% Followed by Cardiology?  yes, Kerin Ransom, PA-C Date last seen by Cardiology: 07/23/2019  Last visit 05/28/2019 with Dr. Margarita Rana. During that encounter patient counseled to continue Lasix, beta-blocker, and ARB.  4. A-FIB FOLLOW-UP: Today denies chest pain. Reports shortness of breath sometimes. Intends to schedule a follow-up appointment with cardiology soon. Endorses that he taken Xarelto as prescribed.  Last visit 05/28/2019 with Dr. Margarita Rana. During that encounter patient stable and encouraged to follow-up with cardiology.  5. GOUT FOLLOW-UP:  Reports stable on a normal day without any significant flares. Reports his trigger for gout flare tends to be  corn syrup.   Last visit 05/27/2019 with Dr. Margarita Rana. During that encounter patient stable. Zyloprim refilled at that time.  6. ITCHING MOLE: Reports mole on the left side of neck has been there for 30 years without complication. In the last month has been intensely itching. Denies change in size, bleeding, and break in skin. Only sore and painful when scratching. Denies changes in  soaps, lotions, and cologne, shampoo. Reports he has tried three types of itching creams of which he cannot recall the name. Requesting referral to dermatologist for further evaluation.  Patient Active Problem List   Diagnosis Date Noted  . History of GI bleed 01/28/2019  . Hematemesis 08/10/2018  . Atrial fibrillation with RVR ([place]) 08/10/2018  . Acute blood loss anemia 08/10/2018  . Sleep apnea 06/14/2018  . Plantar fasciitis 05/29/2018  . Atrial flutter ([place]) 04/22/2018  . Renal insufficiency 01/02/2018  . Gout 12/03/2017  . Prediabetes 12/03/2017  . Chronic anticoagulation 11/30/2017  . Morbid obesity ([place])   . Acute systolic HF (heart failure) ([place])   . Persistent atrial fibrillation ([place]) 11/19/2017  . Chronic systolic congestive heart failure ([place]) 11/19/2017  . Essential hypertension 11/19/2017  . Obesity, Class III, BMI 40-49.9 (morbid obesity) ([place]) 11/19/2017  . CKD (chronic kidney disease) stage 3, GFR 30-59 ml/min 11/19/2017  . LBBB (left bundle branch block) 11/19/2017     Current Outpatient Medications on File Prior to Visit  Medication Sig Dispense Refill  . allopurinol (ZYLOPRIM) 300 MG tablet Take 1 tablet (300 mg total) by mouth daily. 30 tablet 6  . carvedilol (COREG) 25 MG tablet Take 1 tablet (25 mg total) by mouth 2 (two) times daily with a meal. 180 tablet 3  . COLCRYS 0.6 MG tablet TAKE 1 TABLET (0.6 MG TOTAL) BY MOUTH ONCE AS NEEDED (TAKE 1.2MG  AND THEN 0.6MG  1 HOUR LATER IF STILL HAVING SYMPTOMS). 30 tablet 2  . furosemide (LASIX) 40 MG tablet Take 1 tablet (40 mg total) by mouth daily. Take 80mg  one day and 40mg  the next take as directed 90 tablet 1  . losartan (COZAAR) 50 MG tablet Take 1 tablet (50 mg total) by mouth daily. 30 tablet 6  . pantoprazole (PROTONIX) 40 MG tablet Take 1 tablet (40 mg total) by mouth daily. 30 tablet 6  . rivaroxaban (XARELTO) 20 MG TABS tablet Take 1 tablet (20 mg total) by mouth daily with supper. 30 tablet 6   No current  facility-administered medications on file prior to visit.    No Known Allergies  Social History   Socioeconomic History  . Marital status: Single    Spouse name: Not on file  . Number of children: Not on file  . Years of education: Not on file  . Highest education level: Not on file  Occupational History  . Not on file  Tobacco Use  . Smoking status: Never Smoker  . Smokeless tobacco: Never Used  Substance and Sexual Activity  . Alcohol use: No  . Drug use: No  . Sexual activity: Yes    Partners: Female  Other Topics Concern  . Not on file  Social History Narrative  . Not on file   Social Determinants of Health   Financial Resource Strain:   . Difficulty of Paying Living Expenses:   Food Insecurity:   . Worried About Charity fundraiser in the Last Year:   . Arboriculturist in the Last Year:   Transportation Needs:   . Lack of Transportation (  Medical):   Marland Kitchen Lack of Transportation (Non-Medical):   Physical Activity:   . Days of Exercise per Week:   . Minutes of Exercise per Session:   Stress:   . Feeling of Stress :   Social Connections:   . Frequency of Communication with Friends and Family:   . Frequency of Social Gatherings with Friends and Family:   . Attends Religious Services:   . Active Member of Clubs or Organizations:   . Attends Archivist Meetings:   Marland Kitchen Marital Status:   Intimate Partner Violence:   . Fear of Current or Ex-Partner:   . Emotionally Abused:   Marland Kitchen Physically Abused:   . Sexually Abused:     Family History  Problem Relation Age of Onset  . Heart attack Paternal Grandfather   . Hyperlipidemia Mother   . Hypertension Mother     Past Surgical History:  Procedure Laterality Date  . ABDOMINAL SURGERY    . APPENDECTOMY     at 54 years old  . BIOPSY  08/11/2018   Procedure: BIOPSY;  Surgeon: Laurence Spates, MD;  Location: [facility] ENDOSCOPY;  Service: Endoscopy;;  . CARDIOVERSION N/A 03/18/2018   Procedure: CARDIOVERSION;  Surgeon:  Skeet Latch, MD;  Location: [facility] ENDOSCOPY;  Service: Cardiovascular;  Laterality: N/A;  . ESOPHAGOGASTRODUODENOSCOPY (EGD) WITH PROPOFOL N/A 08/11/2018   Procedure: ESOPHAGOGASTRODUODENOSCOPY (EGD) WITH PROPOFOL;  Surgeon: Laurence Spates, MD;  Location: [facility] ENDOSCOPY;  Service: Endoscopy;  Laterality: N/A;    ROS: Review of Systems Negative except as stated above  PHYSICAL EXAM: Vitals with BMI 01/20/2020 07/23/2019 06/26/2019  Height - 6\' 1"  6\' 1"   Weight 383 lbs 10 oz 371 lbs 388 lbs 13 oz  BMI - 0000000 [id number]  Systolic A999333 0000000 A999333  Diastolic 96 88 70  Pulse [id number] 85 81  SpO2- 97%, room air Temperature- 96.3 F, oral  Physical Exam  General appearance - alert, well appearing, and in no distress, oriented to person, place, and time and overweight Mental status - alert, oriented to person, place, and time, normal mood, behavior, speech, dress, motor activity, and thought processes Neck - supple, no significant adenopathy, mole on left lateral neck about pinpoint size without erythema and discharge with normal pigmentation Lymphatics - no palpable lymphadenopathy, no hepatosplenomegaly Chest - clear to auscultation, no wheezes, rales or rhonchi, symmetric air entry, no tachypnea, retractions or cyanosis Heart - normal rate, regular rhythm, normal S1, S2, no murmurs, rubs, clicks or gallops Skin - normal coloration and turgor, no rashes  ASSESSMENT AND PLAN: 1. Essential hypertension: -Blood pressure not at goal today. Patient reports he did not take medication prior to appointment. -Continue Losartan as prescribed. Reports he is not taking Carvedilol as prescribed because of the side effects and not interested in adding a new blood pressure medication at this time. Patient plans to follow-up with cardiology soon. -Counseled on blood pressure goal of less than 130/80, low-sodium, DASH diet, medication compliance, 150 minutes of moderate intensity exercise per week. Discussed medication  compliance, adverse effects. -Obtain BMP today to assess electrolytes and kidney function. -Follow-up with primary physician in 3 months or sooner if needed. - Basic Metabolic Panel - carvedilol (COREG) 25 MG tablet; Take 1 tablet (25 mg total) by mouth 2 (two) times daily with a meal.  Dispense: 180 tablet; Refill: 2 - losartan (COZAAR) 50 MG tablet; Take 1 tablet (50 mg total) by mouth daily.  Dispense: 30 tablet; Refill: 2  2. Hypertensive heart disease with chronic combined systolic  and diastolic congestive heart failure ([place]): -Continue Carvedilol, Furosemide, and Losartan as prescribed. -Fluid restriction and low-sodium cardiac diet. -Follow-up with cardiology and primary provider as scheduled. - Basic Metabolic Panel - carvedilol (COREG) 25 MG tablet; Take 1 tablet (25 mg total) by mouth 2 (two) times daily with a meal.  Dispense: 180 tablet; Refill: 2 - furosemide (LASIX) 40 MG tablet; Take 1 tablet (40 mg total) by mouth daily. Take 80mg  one day and 40mg  the next take as directed  Dispense: 30 tablet; Refill: 2 - losartan (COZAAR) 50 MG tablet; Take 1 tablet (50 mg total) by mouth daily.  Dispense: 30 tablet; Refill: 2  3. Atrial fibrillation with RVR ([place]): -Currently stable. Continue Rivaroxaban as prescribed.  -Follow-up with cardiology as scheduled. - rivaroxaban (XARELTO) 20 MG TABS tablet; Take 1 tablet (20 mg total) by mouth daily with supper.  Dispense: 30 tablet; Refill: 2  4. Acute blood loss anemia: -Continue Pantoprazole as prescribed. -Referral to gastroenterology for endoscopy as recommended by primary physician on 05/28/2019 for further evaluation of anemia. -Obtain CBC with differential to evaluate blood count. -Follow-up with primary physician as needed. - CBC With Differential - pantoprazole (PROTONIX) 40 MG tablet; Take 1 tablet (40 mg total) by mouth daily.  Dispense: 30 tablet; Refill: 2 - Ambulatory referral to Gastroenterology  5. Idiopathic chronic gout  of foot without tophus, unspecified laterality: -Currently stable.  -Continue Allopurinol as prescribed. -Follow-up with primary physician as needed. - allopurinol (ZYLOPRIM) 300 MG tablet; Take 1 tablet (300 mg total) by mouth daily.  Dispense: 30 tablet; Refill: 2  6. Change in skin mole: -Referral to dermatology, per patient request, for further evaluation of mole on neck. -Offered treatment for itching. Patient declined. - Ambulatory referral to Dermatology  Patient was given the opportunity to ask questions.  Patient verbalized understanding of the plan and was able to repeat key elements of the plan. Patient was given clear instructions to go to Emergency Department or return to medical center if symptoms don't improve, worsen, or new problems develop.The patient verbalized understanding.  Camillia Herter, NP

## 2020-01-20 NOTE — Progress Notes (Signed)
Pt states his pain his coming from gout

## 2020-01-20 NOTE — Patient Instructions (Addendum)
Continue medications as prescribed. Referral to dermatology and gastroenterology. Follow-up with primary physician in 3 months or sooner if needed. Hypertension, Adult Hypertension is another name for high blood pressure. High blood pressure forces your heart to work harder to pump blood. This can cause problems over time. There are two numbers in a blood pressure reading. There is a top number (systolic) over a bottom number (diastolic). It is best to have a blood pressure that is below 120/80. Healthy choices can help lower your blood pressure, or you may need medicine to help lower it. What are the causes? The cause of this condition is not known. Some conditions may be related to high blood pressure. What increases the risk?  Smoking.  Having type 2 diabetes mellitus, high cholesterol, or both.  Not getting enough exercise or physical activity.  Being overweight.  Having too much fat, sugar, calories, or salt (sodium) in your diet.  Drinking too much alcohol.  Having long-term (chronic) kidney disease.  Having a family history of high blood pressure.  Age. Risk increases with age.  Race. You may be at higher risk if you are African American.  Gender. Men are at higher risk than women before age 69. After age 56, women are at higher risk than men.  Having obstructive sleep apnea.  Stress. What are the signs or symptoms?  High blood pressure may not cause symptoms. Very high blood pressure (hypertensive crisis) may cause: ? Headache. ? Feelings of worry or nervousness (anxiety). ? Shortness of breath. ? Nosebleed. ? A feeling of being sick to your stomach (nausea). ? Throwing up (vomiting). ? Changes in how you see. ? Very bad chest pain. ? Seizures. How is this treated?  This condition is treated by making healthy lifestyle changes, such as: ? Eating healthy foods. ? Exercising more. ? Drinking less alcohol.  Your health care provider may prescribe medicine if  lifestyle changes are not enough to get your blood pressure under control, and if: ? Your top number is above 130. ? Your bottom number is above 80.  Your personal target blood pressure may vary. Follow these instructions at home: Eating and drinking   If told, follow the DASH eating plan. To follow this plan: ? Fill one half of your plate at each meal with fruits and vegetables. ? Fill one fourth of your plate at each meal with whole grains. Whole grains include whole-wheat pasta, brown rice, and whole-grain bread. ? Eat or drink low-fat dairy products, such as skim milk or low-fat yogurt. ? Fill one fourth of your plate at each meal with low-fat (lean) proteins. Low-fat proteins include fish, chicken without skin, eggs, beans, and tofu. ? Avoid fatty meat, cured and processed meat, or chicken with skin. ? Avoid pre-made or processed food.  Eat less than 1,500 mg of salt each day.  Do not drink alcohol if: ? Your doctor tells you not to drink. ? You are pregnant, may be pregnant, or are planning to become pregnant.  If you drink alcohol: ? Limit how much you use to:  0-1 drink a day for women.  0-2 drinks a day for men. ? Be aware of how much alcohol is in your drink. In the [place] one drink equals one 12 oz bottle of beer (355 mL), one 5 oz glass of wine (148 mL), or one 1 oz glass of hard liquor (44 mL). Lifestyle   Work with your doctor to stay at a healthy weight or to lose  weight. Ask your doctor what the best weight is for you.  Get at least 30 minutes of exercise most days of the week. This may include walking, swimming, or biking.  Get at least 30 minutes of exercise that strengthens your muscles (resistance exercise) at least 3 days a week. This may include lifting weights or doing Pilates.  Do not use any products that contain nicotine or tobacco, such as cigarettes, e-cigarettes, and chewing tobacco. If you need help quitting, ask your doctor.  Check your blood  pressure at home as told by your doctor.  Keep all follow-up visits as told by your doctor. This is important. Medicines  Take over-the-counter and prescription medicines only as told by your doctor. Follow directions carefully.  Do not skip doses of blood pressure medicine. The medicine does not work as well if you skip doses. Skipping doses also puts you at risk for problems.  Ask your doctor about side effects or reactions to medicines that you should watch for. Contact a doctor if you:  Think you are having a reaction to the medicine you are taking.  Have headaches that keep coming back (recurring).  Feel dizzy.  Have swelling in your ankles.  Have trouble with your vision. Get help right away if you:  Get a very bad headache.  Start to feel mixed up (confused).  Feel weak or numb.  Feel faint.  Have very bad pain in your: ? Chest. ? Belly (abdomen).  Throw up more than once.  Have trouble breathing. Summary  Hypertension is another name for high blood pressure.  High blood pressure forces your heart to work harder to pump blood.  For most people, a normal blood pressure is less than 120/80.  Making healthy choices can help lower blood pressure. If your blood pressure does not get lower with healthy choices, you may need to take medicine. This information is not intended to replace advice given to you by your health care provider. Make sure you discuss any questions you have with your health care provider. Document Revised: 05/01/2018 Document Reviewed: 05/01/2018 Elsevier Patient Education  2020 [employer].

## 2020-01-21 LAB — CBC WITH DIFFERENTIAL
Basophils Absolute: 0.1 10*3/uL (ref 0.0–0.2)
Basos: 1 %
EOS (ABSOLUTE): 0.2 10*3/uL (ref 0.0–0.4)
Eos: 2 %
Hematocrit: 42 % (ref 37.5–51.0)
Hemoglobin: 11.8 g/dL — ABNORMAL LOW (ref 13.0–17.7)
Immature Grans (Abs): 0 10*3/uL (ref 0.0–0.1)
Immature Granulocytes: 0 %
Lymphocytes Absolute: 1.4 10*3/uL (ref 0.7–3.1)
Lymphs: 15 %
MCH: 23.1 pg — ABNORMAL LOW (ref 26.6–33.0)
MCHC: 28.1 g/dL — ABNORMAL LOW (ref 31.5–35.7)
MCV: 82 fL (ref 79–97)
Monocytes Absolute: 0.9 10*3/uL (ref 0.1–0.9)
Monocytes: 10 %
Neutrophils Absolute: 7 10*3/uL (ref 1.4–7.0)
Neutrophils: 72 %
RBC: 5.1 x10E6/uL (ref 4.14–5.80)
RDW: 17.2 % — ABNORMAL HIGH (ref 11.6–15.4)
WBC: 9.6 10*3/uL (ref 3.4–10.8)

## 2020-01-21 LAB — BASIC METABOLIC PANEL
BUN/Creatinine Ratio: 14 (ref 9–20)
BUN: 21 mg/dL (ref 6–24)
CO2: 21 mmol/L (ref 20–29)
Calcium: 9.3 mg/dL (ref 8.7–10.2)
Chloride: 106 mmol/L (ref 96–106)
Creatinine, Ser: 1.53 mg/dL — ABNORMAL HIGH (ref 0.76–1.27)
GFR calc Af Amer: 59 mL/min/{1.73_m2} — ABNORMAL LOW (ref >59)
GFR calc non Af Amer: 51 mL/min/{1.73_m2} — ABNORMAL LOW (ref >59)
Glucose: 125 mg/dL — ABNORMAL HIGH (ref 65–99)
Potassium: 4.4 mmol/L (ref 3.5–5.2)
Sodium: 145 mmol/L — ABNORMAL HIGH (ref 134–144)

## 2020-01-22 NOTE — Progress Notes (Signed)
Please call patient with update.   Fasting blood sugar higher than normal. This means you may be at increased risk of developing diabetes. A heart healthy diet consistent of baked or broiled lean meats such as chicken or fish and vegetables along with moderate intensity exercise at least 30  minutes daily as tolerated may help decrease blood sugar. Follow-up with primary physician at next appointment.  Kidney function not within normal limits but about the same since last visit 6 months ago. Patient has history of CKD3. Follow-up with primary physician at next appointment.  Hemoglobin low but improved some since last visit. Patient has been referred to GI for an EGD by his primary physician since June 2020. He has not made appointment as of yet. Continue Protonix as prescribed.

## 2020-01-23 NOTE — Progress Notes (Signed)
Order placed for colonoscopy to screen for colon cancer.   Order placed for A1C within 1 week to screen for pre-diabetes. Schedule appointment and come to appointment fasting.

## 2020-01-23 NOTE — Addendum Note (Signed)
Addended by: Camillia Herter on: 01/23/2020 08:51 AM   Modules accepted: Orders

## 2020-01-24 ENCOUNTER — Telehealth: Payer: Self-pay

## 2020-01-24 NOTE — Telephone Encounter (Signed)
Contacted pt to go over lab results pt didn't answer and was unable to lvm due to vm being full

## 2020-02-18 DIAGNOSIS — D224 Melanocytic nevi of scalp and neck: Secondary | ICD-10-CM | POA: Diagnosis not present

## 2020-02-20 DIAGNOSIS — D239 Other benign neoplasm of skin, unspecified: Secondary | ICD-10-CM | POA: Diagnosis not present

## 2020-02-25 ENCOUNTER — Encounter: Payer: Self-pay | Admitting: Gastroenterology

## 2020-02-25 ENCOUNTER — Ambulatory Visit (INDEPENDENT_AMBULATORY_CARE_PROVIDER_SITE_OTHER): Payer: Medicaid Other | Admitting: Gastroenterology

## 2020-02-25 VITALS — BP 136/90 | HR 80 | Ht 73.0 in | Wt 364.6 lb

## 2020-02-25 DIAGNOSIS — I5022 Chronic systolic (congestive) heart failure: Secondary | ICD-10-CM | POA: Diagnosis not present

## 2020-02-25 DIAGNOSIS — Z8719 Personal history of other diseases of the digestive system: Secondary | ICD-10-CM

## 2020-02-25 DIAGNOSIS — N1831 Chronic kidney disease, stage 3a: Secondary | ICD-10-CM

## 2020-02-25 DIAGNOSIS — G4733 Obstructive sleep apnea (adult) (pediatric): Secondary | ICD-10-CM

## 2020-02-25 DIAGNOSIS — I4891 Unspecified atrial fibrillation: Secondary | ICD-10-CM | POA: Diagnosis not present

## 2020-02-25 DIAGNOSIS — Z8711 Personal history of peptic ulcer disease: Secondary | ICD-10-CM

## 2020-02-25 DIAGNOSIS — D509 Iron deficiency anemia, unspecified: Secondary | ICD-10-CM | POA: Diagnosis not present

## 2020-02-25 NOTE — Progress Notes (Signed)
[facility] Consult Note:  History: Joe Blake 02/25/2020  Referring provider: Charlott Rakes, MD  Reason for consult/chief complaint: Anemia (December 2019, acute blood loss, vomiting large volumes of blood)   Subjective  HPI: Hospital admission December 2019 for hematemesis on Xarelto for A. fib.  Hemoglobin 8.3, MCV 87.  EGD by Dr. Leonie Blake of [facility] revealed hiatal hernia and small distal gastric clean-based ulcers felt likely to be the source.  H. pylori negative on biopsy.  From last cardiology office note 07/23/2019: "Joe Blake is a 54 y.o. male with a hx of presumed NICM, PAF, morbid obesity with a BMI of 50, OSA documented on sleep study Sept 2020 (not yet on BiPap), and past GI bleed Dec 2019. He was seen in the office 06/26/2019. He was quite SOB when he came in the office.  His weight was significantly up- 388 lbs from 360 lbs in June. His PCP had suggested he increase his Lasix to 40 mg BID but he admitted to me he is only taking it QD because of frequent urination interfering with his new job.    I increased his Lasix to 80 mg daily for 5 days, he was the  To go back to 40 mg daily.  Labs done 06/26/2019 showed a slight bump in his SCr to 1.63 and a pro BNP of 2889.    He returns today for follow up.  He feels better, less SOB. He tells me he found that Lasix 80 mg alternating with 40 mg seems ed to work best,  He noted re accumilation of fluid on just 40 mg daily.  His weight is down 17 lbs- to 371 lbs. He still hasn't gotten his BiPap- I'm not sure why, he says he was never contacted" Echo Sept 2019 EF 30-35%   This is a 54 year old man referred by primary care for chronic anemia.  He is unable to provide much history, limited health literacy.  Primary care was concerned there could be a GI cause of the anemia.  Joe Blake disagrees since he does not see any blood in his stool.  He does not have chronic abdominal pain, constipation or diarrhea.  He  denies dysphagia odynophagia or vomiting. As far as he knows, he had an ulcer in 2019 and was told to follow-up afterwards but it did not occur.  ROS:  Review of Systems  Constitutional: Negative for appetite change and unexpected weight change.  HENT: Negative for mouth sores and voice change.   Eyes: Negative for pain and redness.  Respiratory: Negative for cough and shortness of breath.   Cardiovascular: Positive for leg swelling. Negative for chest pain and palpitations.  Genitourinary: Negative for dysuria and hematuria.  Musculoskeletal: Negative for arthralgias and myalgias.  Skin: Negative for pallor and rash.  Neurological: Negative for weakness and headaches.  Hematological: Negative for adenopathy.     Past Medical History: Past Medical History:  Diagnosis Date  . Acute systolic HF (heart failure) ([place])   . Atrial fibrillation ([place])   . Diabetes mellitus without complication ([place])   . Hiatal hernia   . History of esophageal ulcer   . History of gastric ulcer   . Hypertension   . Morbid obesity ([place])    Joe Blake does not think he has sleep apnea because "I go to sleep at night and I wake up fine in the morning"  Past Surgical History: Past Surgical History:  Procedure Laterality Date  . ABDOMINAL SURGERY    .  APPENDECTOMY     at 54 years old  . BIOPSY  08/11/2018   Procedure: BIOPSY;  Surgeon: Joe Spates, MD;  Location: [facility];  Service: Endoscopy;;  . CARDIOVERSION N/A 03/18/2018   Procedure: CARDIOVERSION;  Surgeon: Joe Latch, MD;  Location: [facility] ENDOSCOPY;  Service: Cardiovascular;  Laterality: N/A;  . ESOPHAGOGASTRODUODENOSCOPY (EGD) WITH PROPOFOL N/A 08/11/2018   Procedure: ESOPHAGOGASTRODUODENOSCOPY (EGD) WITH PROPOFOL;  Surgeon: Joe Spates, MD;  Location: [facility];  Service: Endoscopy;  Laterality: N/A;     Family History: Family History  Problem Relation Age of Onset  . Heart attack Paternal Grandfather   . Hyperlipidemia Mother     . Hypertension Mother     Social History: Social History   Socioeconomic History  . Marital status: Single    Spouse name: Not on file  . Number of children: Not on file  . Years of education: Not on file  . Highest education level: Not on file  Occupational History  . Not on file  Tobacco Use  . Smoking status: Never Smoker  . Smokeless tobacco: Never Used  Vaping Use  . Vaping Use: Never used  Substance and Sexual Activity  . Alcohol use: No  . Drug use: No  . Sexual activity: Yes    Partners: Female  Other Topics Concern  . Not on file  Social History Narrative  . Not on file   Social Determinants of Health   Financial Resource Strain:   . Difficulty of Paying Living Expenses:   Food Insecurity:   . Worried About Charity fundraiser in the Last Year:   . Arboriculturist in the Last Year:   Transportation Needs:   . Film/video editor (Medical):   Marland Kitchen Lack of Transportation (Non-Medical):   Physical Activity:   . Days of Exercise per Week:   . Minutes of Exercise per Session:   Stress:   . Feeling of Stress :   Social Connections:   . Frequency of Communication with Friends and Family:   . Frequency of Social Gatherings with Friends and Family:   . Attends Religious Services:   . Active Member of Clubs or Organizations:   . Attends Archivist Meetings:   Marland Kitchen Marital Status:     Allergies: No Known Allergies  Outpatient Meds: Current Outpatient Medications  Medication Sig Dispense Refill  . allopurinol (ZYLOPRIM) 300 MG tablet Take 1 tablet (300 mg total) by mouth daily. 30 tablet 2  . COLCRYS 0.6 MG tablet TAKE 1 TABLET (0.6 MG TOTAL) BY MOUTH ONCE AS NEEDED (TAKE 1.2MG  AND THEN 0.6MG  1 HOUR LATER IF STILL HAVING SYMPTOMS). 30 tablet 2  . furosemide (LASIX) 40 MG tablet Take 1 tablet (40 mg total) by mouth daily. Take 80mg  one day and 40mg  the next take as directed 30 tablet 2  . losartan (COZAAR) 50 MG tablet Take 1 tablet (50 mg total) by  mouth daily. 30 tablet 2  . pantoprazole (PROTONIX) 40 MG tablet Take 1 tablet (40 mg total) by mouth daily. 30 tablet 2  . rivaroxaban (XARELTO) 20 MG TABS tablet Take 1 tablet (20 mg total) by mouth daily with supper. 30 tablet 2  . carvedilol (COREG) 25 MG tablet Take 1 tablet (25 mg total) by mouth 2 (two) times daily with a meal. (Patient not taking: Reported on 02/25/2020) 180 tablet 2   No current facility-administered medications for this visit.    He decided to start an OTC iron tablet  every other day   ___________________________________________________________________ Objective   Exam:  BP 136/90   Pulse 80   Ht 6\' 1"  (1.854 m)   Wt (!) 364 lb 9.6 oz (165.4 kg)   BMI 48.10 kg/m    General: Morbidly obese, ambulatory and gets on exam table without assistance, restricted affect  Eyes: sclera anicteric, no redness  ENT: oral mucosa moist without lesions, no cervical or supraclavicular lymphadenopathy  CV: RRR without murmur, S1/S2, no JVD, + peripheral edema  Resp: clear to auscultation bilaterally, normal RR and effort noted  GI: soft, no tenderness, with active bowel sounds.  Exam limited by body habitus  Skin; warm and dry, no rash or jaundice noted  Neuro: awake, alert and oriented x 3. Normal gross motor function and fluent speech  Labs:  CBC Latest Ref Rng & Units 01/20/2020 06/26/2019 02/24/2019  WBC 3.4 - 10.8 x10E3/uL 9.6 6.9 9.2  Hemoglobin 13.0 - 17.7 g/dL 11.8(L) 9.9(L) 11.5(L)  Hematocrit 37.5 - 51.0 % 42.0 33.7(L) 40.5  Platelets 150 - 450 x10E3/uL - 268 382   CMP Latest Ref Rng & Units 01/20/2020 07/23/2019 06/26/2019  Glucose 65 - 99 mg/dL 125(H) 117(H) 106(H)  BUN 6 - 24 mg/dL 21 22 22   Creatinine 0.76 - 1.27 mg/dL 1.53(H) 1.65(H) 1.63(H)  Sodium 134 - 144 mmol/L 145(H) 141 142  Potassium 3.5 - 5.2 mmol/L 4.4 4.6 4.4  Chloride 96 - 106 mmol/L 106 101 107(H)  CO2 20 - 29 mmol/L 21 24 23   Calcium 8.7 - 10.2 mg/dL 9.3 9.5 9.0  Total Protein  6.0 - 8.5 g/dL - - -  Total Bilirubin 0.0 - 1.2 mg/dL - - -  Alkaline Phos 39 - 117 IU/L - - -  AST 0 - 40 IU/L - - -  ALT 0 - 44 IU/L - - -   No iron levels on file in this EMR   Assessment: Encounter Diagnoses  Name Primary?  Marland Kitchen Hx of gastric ulcer Yes  . Microcytic anemia   . Atrial fibrillation with RVR ([place])   . Morbid obesity ([place])   . Chronic systolic congestive heart failure ([place])   . Stage 3a chronic kidney disease   . Obstructive sleep apnea syndrome     Mild ulcer disease on the inpatient EGD, but it may have caused the bleeding in the setting of oral anticoagulation. Chronic anemia, seems most likely from chronic kidney disease. Reasonable to do endoscopic work-up to rule out source of chronic GI blood loss.  Plan:  I recommended EGD and colonoscopy, which would have to be done in the hospital outpatient endoscopy lab due to the patient's BMI and medical conditions.  The benefits and risks of the planned procedure were described in detail with the patient or (when appropriate) their health care proxy.  Risks were outlined as including, but not limited to, bleeding, infection, perforation, adverse medication reaction leading to cardiac or pulmonary decompensation, pancreatitis (if ERCP).  The limitation of incomplete mucosal visualization was also discussed.  No guarantees or warranties were given.  Patient at increased risk for cardiopulmonary complications of procedure due to medical comorbidities.  My next available date is in early August, but he says he cannot do that because he will be out of town the entire month of August for "a [employer]".  He also does not know if he could find someone to be his care partner for the day.  I offered to have our office contact him when the September schedule  hospital procedures is available, and he was agreeable to that.  Meanwhile, it is not clear that he still needs to be on twice daily acid suppression, it seems maybe  this is just been continued since the upper endoscopy in 2019.  I recommended decreasing it to once a day.  Recommend following up with primary care to follow blood counts and check iron levels at some point.  If his iron levels remain low despite his reported OTC iron supplement, dose should be increased or changed to IV iron.  Thank you for the courtesy of this consult.  Please call me with any questions or concerns.  Nelida Meuse III  CC: Referring provider noted above

## 2020-02-25 NOTE — Patient Instructions (Signed)
If you are age 53 or older, your body mass index should be between 23-30. Your Body mass index is 48.1 kg/m. If this is out of the aforementioned range listed, please consider follow up with your Primary Care Provider.  If you are age 22 or younger, your body mass index should be between 19-25. Your Body mass index is 48.1 kg/m. If this is out of the aformentioned range listed, please consider follow up with your Primary Care Provider.   We will contact you with September hospital scoping availability dates and times when released.  It has been recommended to you by your physician that you have a(n) EGD/Colonoscopy completed. Per your request, we did not schedule the procedure(s) today. Please contact our office at [phone] should you decide to have the procedure completed. You will be scheduled for a pre-visit and procedure at that time.   It was a pleasure to see you today!  Dr. Loletha Carrow

## 2020-03-04 DIAGNOSIS — D239 Other benign neoplasm of skin, unspecified: Secondary | ICD-10-CM | POA: Diagnosis not present

## 2020-03-04 DIAGNOSIS — I10 Essential (primary) hypertension: Secondary | ICD-10-CM | POA: Diagnosis not present

## 2020-03-18 ENCOUNTER — Telehealth: Payer: Self-pay | Admitting: Cardiology

## 2020-03-19 NOTE — Telephone Encounter (Signed)
PT is scheduled 10/21 at 1:45

## 2020-03-22 DIAGNOSIS — H5213 Myopia, bilateral: Secondary | ICD-10-CM | POA: Diagnosis not present

## 2020-03-25 ENCOUNTER — Encounter: Payer: Self-pay | Admitting: Family Medicine

## 2020-03-25 ENCOUNTER — Other Ambulatory Visit: Payer: Self-pay | Admitting: Family Medicine

## 2020-03-25 DIAGNOSIS — I110 Hypertensive heart disease with heart failure: Secondary | ICD-10-CM

## 2020-03-25 MED ORDER — FUROSEMIDE 40 MG PO TABS: 40.0000 mg | ORAL_TABLET | Freq: Every day | ORAL | 1 refills | Status: DC

## 2020-03-26 MED FILL — FUROSEMIDE 40 MG TAB: 40 | 30 days supply | Qty: 90 | Fill #0

## 2020-04-05 DIAGNOSIS — H5213 Myopia, bilateral: Secondary | ICD-10-CM | POA: Diagnosis not present

## 2020-04-09 ENCOUNTER — Encounter: Payer: Self-pay | Admitting: Family Medicine

## 2020-04-09 ENCOUNTER — Other Ambulatory Visit: Payer: Self-pay | Admitting: Family Medicine

## 2020-04-09 ENCOUNTER — Other Ambulatory Visit: Payer: Self-pay

## 2020-04-09 DIAGNOSIS — D62 Acute posthemorrhagic anemia: Secondary | ICD-10-CM

## 2020-04-09 DIAGNOSIS — I4891 Unspecified atrial fibrillation: Secondary | ICD-10-CM

## 2020-04-09 MED ORDER — RIVAROXABAN 20 MG PO TABS: 20.0000 mg | ORAL_TABLET | Freq: Every day | ORAL | 1 refills | Status: DC

## 2020-04-09 MED ORDER — PANTOPRAZOLE SODIUM 40 MG PO TBEC: 40.0000 mg | DELAYED_RELEASE_TABLET | Freq: Every day | ORAL | 1 refills | Status: DC

## 2020-04-09 MED FILL — PANTOPRAZOLE SOD DR 40 MG T: 40 | 90 days supply | Qty: 90 | Fill #0

## 2020-04-09 MED FILL — XARELTO 20 MG TABLET: 20 | 90 days supply | Qty: 90 | Fill #0

## 2020-04-21 ENCOUNTER — Other Ambulatory Visit: Payer: Self-pay | Admitting: Family Medicine

## 2020-04-21 ENCOUNTER — Other Ambulatory Visit: Payer: Self-pay

## 2020-04-21 ENCOUNTER — Ambulatory Visit: Payer: Medicaid Other | Attending: Family Medicine | Admitting: Family Medicine

## 2020-04-21 DIAGNOSIS — I5042 Chronic combined systolic (congestive) and diastolic (congestive) heart failure: Secondary | ICD-10-CM | POA: Diagnosis not present

## 2020-04-21 DIAGNOSIS — I110 Hypertensive heart disease with heart failure: Secondary | ICD-10-CM | POA: Diagnosis not present

## 2020-04-21 DIAGNOSIS — Z1211 Encounter for screening for malignant neoplasm of colon: Secondary | ICD-10-CM

## 2020-04-21 DIAGNOSIS — I10 Essential (primary) hypertension: Secondary | ICD-10-CM

## 2020-04-21 DIAGNOSIS — M1A0790 Idiopathic chronic gout, unspecified ankle and foot, without tophus (tophi): Secondary | ICD-10-CM

## 2020-04-21 MED ORDER — LOSARTAN POTASSIUM 50 MG PO TABS: 50.0000 mg | ORAL_TABLET | Freq: Every day | ORAL | 6 refills | Status: DC

## 2020-04-21 MED ORDER — ALLOPURINOL 300 MG PO TABS: 300.0000 mg | ORAL_TABLET | Freq: Every day | ORAL | 6 refills | Status: DC

## 2020-04-21 MED FILL — LOSARTAN POTASSIUM 50 MG TA: 50 | 90 days supply | Qty: 90 | Fill #0

## 2020-04-21 NOTE — Progress Notes (Signed)
Patient has no concerns today. 

## 2020-04-21 NOTE — Progress Notes (Signed)
Virtual Visit via Telephone Note  I connected with Joe Blake, on 04/21/2020 at 1:38 PM by telephone due to the COVID-19 pandemic and verified that I am speaking with the correct person using two identifiers.   Consent: I discussed the limitations, risks, security and privacy concerns of performing an evaluation and management service by telephone and the availability of in person appointments. I also discussed with the patient that there may be a patient responsible charge related to this service. The patient expressed understanding and agreed to proceed.   Location of Patient: Home  Location of Provider: Clinic   Persons participating in Telemedicine visit: Axzel Rockhill Farrington-CMA Dr. Margarita Rana     History of Present Illness: Joe Blake a 54 year old male with history of Prediabetes (A1c 5.6) hypertension, gout, congestive heart failure (EF 30-35% from 05/2018), atrial fibrillationwith RVR/atrial flutterstatus post DCCV on 03/2018 (currently on anticoagulation with Xarelto) here for follow-up visit.   Seen by GI and EGD/colonoscopy recommended for evaluation which he is yet to schedule States he does not want a Colonoscopy  He is doing well from a Cardiac stand point; denies presence of pedal edema, weight, chest pain. Unable to exercise as he get dyspneic on mild exertion. He is compliant with his Lasix, Xarelto and Coreg. Denies recent gout flares and has been compliant with allopurinol.  He has no additional concerns today. Past Medical History:  Diagnosis Date  . Acute systolic HF (heart failure) ([place])   . Atrial fibrillation ([place])   . Diabetes mellitus without complication ([place])   . Hiatal hernia   . History of esophageal ulcer   . History of gastric ulcer   . Hypertension   . Morbid obesity ([place])    No Known Allergies  Current Outpatient Medications on File Prior to Visit  Medication Sig Dispense Refill  . allopurinol (ZYLOPRIM) 300 MG tablet Take 1  tablet (300 mg total) by mouth daily. 30 tablet 2  . carvedilol (COREG) 25 MG tablet Take 1 tablet (25 mg total) by mouth 2 (two) times daily with a meal. 180 tablet 2  . COLCRYS 0.6 MG tablet TAKE 1 TABLET (0.6 MG TOTAL) BY MOUTH ONCE AS NEEDED (TAKE 1.2MG  AND THEN 0.6MG  1 HOUR LATER IF STILL HAVING SYMPTOMS). 30 tablet 2  . furosemide (LASIX) 40 MG tablet Take 1 tablet (40 mg total) by mouth daily. Take 80mg  one day and 40mg  the next take as directed 90 tablet 1  . pantoprazole (PROTONIX) 40 MG tablet Take 1 tablet (40 mg total) by mouth daily. 90 tablet 1  . rivaroxaban (XARELTO) 20 MG TABS tablet Take 1 tablet (20 mg total) by mouth daily with supper. 90 tablet 1  . losartan (COZAAR) 50 MG tablet Take 1 tablet (50 mg total) by mouth daily. 30 tablet 2   No current facility-administered medications on file prior to visit.    Observations/Objective: Awake, alert, oriented x3 Not in acute distress  Assessment and Plan: 1. Essential hypertension Controlled Counseled on blood pressure goal of less than 130/80, low-sodium, DASH diet, medication compliance, 150 minutes of moderate intensity exercise per week. Discussed medication compliance, adverse effects. - losartan (COZAAR) 50 MG tablet; Take 1 tablet (50 mg total) by mouth daily.  Dispense: 30 tablet; Refill: 6  2. Idiopathic chronic gout of foot without tophus, unspecified laterality No recent flares - allopurinol (ZYLOPRIM) 300 MG tablet; Take 1 tablet (300 mg total) by mouth daily.  Dispense: 30 tablet; Refill: 6  3. 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  Dispense: 30 tablet; Refill:  6  4. Screening for colon cancer He is opposed to colonoscopy so I have ordered Cologuard - Cologuard   Follow Up Instructions: 3 months for chronic disease management   I discussed the assessment and treatment plan with the patient. The patient was provided an opportunity to ask questions and all were answered. The patient agreed with the plan and demonstrated an understanding of the instructions.   The patient was advised to call back or seek an in-person evaluation if the symptoms worsen or if the condition fails to improve as anticipated.     I provided 18 minutes total of non-face-to-face time during this encounter including median intraservice time, reviewing previous notes, investigations, ordering medications, medical decision making, coordinating care and patient verbalized understanding at the end of the visit.     Charlott Rakes, MD, FAAFP. [facility] [place], [place]   04/21/2020, 1:38 PM

## 2020-04-22 ENCOUNTER — Encounter: Payer: Self-pay | Admitting: Family Medicine

## 2020-05-17 MED FILL — FUROSEMIDE 40 MG TAB: 40 | 90 days supply | Qty: 90 | Fill #1

## 2020-06-01 ENCOUNTER — Encounter: Payer: Self-pay | Admitting: Cardiology

## 2020-06-01 ENCOUNTER — Telehealth (INDEPENDENT_AMBULATORY_CARE_PROVIDER_SITE_OTHER): Payer: Medicaid Other | Admitting: Cardiology

## 2020-06-01 ENCOUNTER — Other Ambulatory Visit: Payer: Self-pay | Admitting: Cardiology

## 2020-06-01 VITALS — HR 82 | Ht 73.0 in | Wt 365.0 lb

## 2020-06-01 DIAGNOSIS — I10 Essential (primary) hypertension: Secondary | ICD-10-CM | POA: Diagnosis not present

## 2020-06-01 DIAGNOSIS — I5022 Chronic systolic (congestive) heart failure: Secondary | ICD-10-CM

## 2020-06-01 DIAGNOSIS — I110 Hypertensive heart disease with heart failure: Secondary | ICD-10-CM | POA: Diagnosis not present

## 2020-06-01 DIAGNOSIS — Z79899 Other long term (current) drug therapy: Secondary | ICD-10-CM

## 2020-06-01 DIAGNOSIS — I4891 Unspecified atrial fibrillation: Secondary | ICD-10-CM | POA: Diagnosis not present

## 2020-06-01 DIAGNOSIS — I4819 Other persistent atrial fibrillation: Secondary | ICD-10-CM | POA: Diagnosis not present

## 2020-06-01 DIAGNOSIS — N1831 Chronic kidney disease, stage 3a: Secondary | ICD-10-CM

## 2020-06-01 DIAGNOSIS — Z7901 Long term (current) use of anticoagulants: Secondary | ICD-10-CM

## 2020-06-01 DIAGNOSIS — G4733 Obstructive sleep apnea (adult) (pediatric): Secondary | ICD-10-CM

## 2020-06-01 DIAGNOSIS — I5042 Chronic combined systolic (congestive) and diastolic (congestive) heart failure: Secondary | ICD-10-CM | POA: Diagnosis not present

## 2020-06-01 DIAGNOSIS — E66813 Obesity, class 3: Secondary | ICD-10-CM

## 2020-06-01 MED ORDER — FUROSEMIDE 80 MG PO TABS: ORAL_TABLET | ORAL | 3 refills | Status: DC

## 2020-06-01 MED ORDER — LOSARTAN POTASSIUM 50 MG PO TABS
50.0000 mg | ORAL_TABLET | Freq: Every day | ORAL | 3 refills | Status: DC
Start: 2020-06-01 — End: 2020-06-01

## 2020-06-01 MED ORDER — LOSARTAN POTASSIUM 50 MG PO TABS: 50.0000 mg | ORAL_TABLET | Freq: Every day | ORAL | 3 refills | Status: DC

## 2020-06-01 MED ORDER — RIVAROXABAN 20 MG PO TABS: 20.0000 mg | ORAL_TABLET | Freq: Every day | ORAL | 3 refills | Status: DC

## 2020-06-01 MED FILL — LOSARTAN POTASSIUM 50 MG TA: 50 | 90 days supply | Qty: 90 | Fill #0

## 2020-06-01 NOTE — Progress Notes (Signed)
Virtual Visit via Telephone Note   This visit type was conducted due to national recommendations for restrictions regarding the COVID-19 Pandemic (e.g. social distancing) in an effort to limit this patient's exposure and mitigate transmission in our community.  Due to his co-morbid illnesses, this patient is at least at moderate risk for complications without adequate follow up.  This format is felt to be most appropriate for this patient at this time.  The patient did not have access to video technology/had technical difficulties with video requiring transitioning to audio format only (telephone).  All issues noted in this document were discussed and addressed.  No physical exam could be performed with this format.  Please refer to the patient's chart for his  consent to telehealth for [facility].    Date:  06/01/2020   ID:  Joe Blake, DOB 1966-09-03, MRN [id number] The patient was identified using 2 identifiers.  Patient Location: Home Provider Location: Home Office  PCP:  Charlott Rakes, MD  Cardiologist:  Minus Breeding, MD  Electrophysiologist:  None   Evaluation Performed:  Follow-Up Visit  Chief Complaint:  none  History of Present Illness:    Joe Blake is a 54 y.o. male with a hx of presumed NICM, PAF,morbidobesitywith a BMI of 50,OSAdocumented on sleep study Sept 2020 (not yet on BiPap), and past GI bleed Dec 2019.He was seen in the office 06/26/2019. He was quite SOB when he came in the office. His weight was significantly up- 388 lbs from 360 lbs in June. His PCP had suggested he increase his Lasix to 40 mg BID but he admitted to me he was only taking it QD because of frequent urination interfering with his new job. I increased his Lasix to 80 mg daily for 5 days, then he was to go back to 40 mg daily.  Labs done 06/26/2019 showed a slight bump in his SCr to 1.63 and a pro BNP of 2889.    I saw him in follows up 07/23/2019 and he was much improved, his weight was  down from 383 lbs to 371 lbs.  His SCr was stable 1.53.  His BNP was actually higher than in Oct- 3686- despite the fact that he felt better and had lost several pounds.  The patient was contacted today for routine follow-up.  He is running low on some of his medications.  His weight is unchanged.  Overall he says he feels like he is doing pretty well, he does not have problems with increased dyspnea on exertion unless he skips a day or 2 of his diuretics.  He tells me he is going to move in with his cousin who was a Marine scientist.  They are both going to go on the keto diet.   The patient does not have symptoms concerning for COVID-19 infection (fever, chills, cough, or new shortness of breath).    Past Medical History:  Diagnosis Date  . Acute systolic HF (heart failure) ([place])   . Atrial fibrillation ([place])   . Diabetes mellitus without complication ([place])   . Hiatal hernia   . History of esophageal ulcer   . History of gastric ulcer   . Hypertension   . Morbid obesity ([place])    Past Surgical History:  Procedure Laterality Date  . ABDOMINAL SURGERY    . APPENDECTOMY     at 54 years old  . BIOPSY  08/11/2018   Procedure: BIOPSY;  Surgeon: Laurence Spates, MD;  [facility];  Service:  Endoscopy;;  . CARDIOVERSION N/A 03/18/2018   Procedure: CARDIOVERSION;  Surgeon: Skeet Latch, MD;  Location: [facility] ENDOSCOPY;  Service: Cardiovascular;  Laterality: N/A;  . ESOPHAGOGASTRODUODENOSCOPY (EGD) WITH PROPOFOL N/A 08/11/2018   Procedure: ESOPHAGOGASTRODUODENOSCOPY (EGD) WITH PROPOFOL;  Surgeon: Laurence Spates, MD;  [facility]COPY;  Service: Endoscopy;  Laterality: N/A;     Current Meds  Medication Sig  . allopurinol (ZYLOPRIM) 300 MG tablet Take 1 tablet (300 mg total) by mouth daily.  . carvedilol (COREG) 25 MG tablet Take 1 tablet (25 mg total) by mouth 2 (two) times daily with a meal.  . COLCRYS 0.6 MG tablet TAKE 1 TABLET (0.6 MG TOTAL) BY MOUTH ONCE AS NEEDED (TAKE 1.2MG  AND  THEN 0.6MG  1 HOUR LATER IF STILL HAVING SYMPTOMS).  . furosemide (LASIX) 40 MG tablet Take 80 mg(2 tablets) one day and 40 mg(1 tablet) the next day  . losartan (COZAAR) 50 MG tablet Take 1 tablet (50 mg total) by mouth daily.  . pantoprazole (PROTONIX) 40 MG tablet Take 1 tablet (40 mg total) by mouth daily.  . rivaroxaban (XARELTO) 20 MG TABS tablet Take 1 tablet (20 mg total) by mouth daily with supper.  . [DISCONTINUED] furosemide (LASIX) 40 MG tablet Take 1 tablet (40 mg total) by mouth daily. Take 80mg  one day and 40mg  the next take as directed (Patient taking differently: Take 80mg  one day and 40mg  the next take as directed )     Allergies:   Patient has no known allergies.   Social History   Tobacco Use  . Smoking status: Never Smoker  . Smokeless tobacco: Never Used  Vaping Use  . Vaping Use: Never used  Substance Use Topics  . Alcohol use: No  . Drug use: No     Family Hx: The patient's family history includes Heart attack in his paternal grandfather; Hyperlipidemia in his mother; Hypertension in his mother.  ROS:   Please see the history of present illness.    All other systems reviewed and are negative.   Prior CV studies:   The following studies were reviewed today:  Sleep study 05/25/2019- Recommend an initial trial of BiPAP therapy with EPR at 26/22 cm H2O with heated humidification. A Large size [employer] Full Face Mask Simplus mask was used for the titration.   Labs/Other Tests and Data Reviewed:    EKG:  An ECG dated 06/26/2019 was personally reviewed today and demonstrated:  AF with VR 82, LAD, NSST changes  Recent Labs: 06/26/2019: Platelets 268 07/23/2019: NT-Pro BNP 3,686 01/20/2020: BUN 21; Creatinine, Ser 1.53; Hemoglobin 11.8; Potassium 4.4; Sodium 145   Recent Lipid Panel No results found for: CHOL, TRIG, HDL, CHOLHDL, LDLCALC, LDLDIRECT  Wt Readings from Last 3 Encounters:  06/01/20 (!) 365 lb (165.6 kg)  02/25/20 (!) 364 lb 9.6 oz  (165.4 kg)  01/20/20 (!) 383 lb 9.6 oz (174 kg)     Objective:    Vital Signs:  Pulse 82   Ht 6\' 1"  (1.854 m)   Wt (!) 365 lb (165.6 kg)   SpO2 97%   BMI 48.16 kg/m    VITAL SIGNS:  reviewed  ASSESSMENT & PLAN:    Acute on chronic combined CHF- I was ready to admit him when I saw him 06/26/2019 but we decided to try increasing his diuretic first. He is definitely improved today though he may still be 5-10 lbs up.   Sleep apnea- BiPap recommended and [facility] contacted but the patient has not heard  anything yet. I will ask Mariann Laster to look into this- he needs BiPap ASAP.  Persistent atrial fibrillation (HCC) AF with CVR-   Anticoagulated CHADS VASC=3, onXarelto  GI bleed H/O GI bleed- PUD- Dec 2019.   HTN (hypertension) controlled  CRI-3- Last SCr 1.63on 06/26/2019   Morbid obesity- BMI 48.9  Sleep apnea- Documented Sept 2020- we have been unable to get him BiPap.  Plan: Renew meds- OV in 6 months.  I'll reach out again to [facility] to see what the hold up is on his Bi pap.  COVID-19 Education: The signs and symptoms of COVID-19 were discussed with the patient and how to seek care for testing (follow up with PCP or arrange E-visit).  The importance of social distancing was discussed today.  Time:   Today, I have spent 20 minutes with the patient with telehealth technology discussing the above problems.     Medication Adjustments/Labs and Tests Ordered: Current medicines are reviewed at length with the patient today.  Concerns regarding medicines are outlined above.   Tests Ordered: No orders of the defined types were placed in this encounter.   Medication Changes: No orders of the defined types were placed in this encounter.   Follow Up:  In Person In 6 months.  Check BMP and BNP.  Renew meds  Signed, Kerin Ransom, [place]  06/01/2020 2:04 PM    [facility]

## 2020-06-01 NOTE — Patient Instructions (Signed)
Medication Instructions:  Continue current medications  *If you need a refill on your cardiac medications before your next appointment, please call your pharmacy*   Lab Work: BMP and BNP  If you have labs (blood work) drawn today and your tests are completely normal, you will receive your results only by: Marland Kitchen MyChart Message (if you have MyChart) OR . A paper copy in the mail If you have any lab test that is abnormal or we need to change your treatment, we will call you to review the results.   Testing/Procedures: None Ordered   Follow-Up: At [facility], you and your health needs are our priority.  As part of our continuing mission to provide you with exceptional heart care, we have created designated Provider Care Teams.  These Care Teams include your primary Cardiologist (physician) and Advanced Practice Providers (APPs -  Physician Assistants and Nurse Practitioners) who all work together to provide you with the care you need, when you need it.  We recommend signing up for the patient portal called "MyChart".  Sign up information is provided on this After Visit Summary.  MyChart is used to connect with patients for Virtual Visits (Telemedicine).  Patients are able to view lab/test results, encounter notes, upcoming appointments, etc.  Non-urgent messages can be sent to your provider as well.   To learn more about what you can do with MyChart, go to [email or web address].    Your next appointment:   6 month(s)  The format for your next appointment:   In Person  Provider:   You may see Minus Breeding, MD or one of the following Advanced Practice Providers on your designated Care Team:    Rosaria Ferries, PA-C  Jory Sims, DNP, ANP

## 2020-06-09 MED FILL — FUROSEMIDE 80 MG TAB: 80 | 90 days supply | Qty: 135 | Fill #0

## 2020-06-09 MED FILL — LOSARTAN POTASSIUM 50 MG TA: 50 | 90 days supply | Qty: 90 | Fill #0

## 2020-06-24 ENCOUNTER — Encounter: Payer: Self-pay | Admitting: Cardiology

## 2020-06-24 ENCOUNTER — Other Ambulatory Visit: Payer: Self-pay

## 2020-06-24 ENCOUNTER — Ambulatory Visit (INDEPENDENT_AMBULATORY_CARE_PROVIDER_SITE_OTHER): Payer: Medicaid Other | Admitting: Cardiology

## 2020-06-24 VITALS — BP 130/90 | HR 113 | Ht 73.0 in | Wt 365.0 lb

## 2020-06-24 DIAGNOSIS — I5022 Chronic systolic (congestive) heart failure: Secondary | ICD-10-CM | POA: Diagnosis not present

## 2020-06-24 DIAGNOSIS — Z79899 Other long term (current) drug therapy: Secondary | ICD-10-CM | POA: Diagnosis not present

## 2020-06-24 NOTE — Patient Instructions (Signed)
Medication Instructions:  Continue current medications  *If you need a refill on your cardiac medications before your next appointment, please call your pharmacy*   Lab Work: BMP and CBC  If you have labs (blood work) drawn today and your tests are completely normal, you will receive your results only by: Marland Kitchen MyChart Message (if you have MyChart) OR . A paper copy in the mail If you have any lab test that is abnormal or we need to change your treatment, we will call you to review the results.   Testing/Procedures: None Ordered   Follow-Up: At [facility], you and your health needs are our priority.  As part of our continuing mission to provide you with exceptional heart care, we have created designated Provider Care Teams.  These Care Teams include your primary Cardiologist (physician) and Advanced Practice Providers (APPs -  Physician Assistants and Nurse Practitioners) who all work together to provide you with the care you need, when you need it.  We recommend signing up for the patient portal called "MyChart".  Sign up information is provided on this After Visit Summary.  MyChart is used to connect with patients for Virtual Visits (Telemedicine).  Patients are able to view lab/test results, encounter notes, upcoming appointments, etc.  Non-urgent messages can be sent to your provider as well.   To learn more about what you can do with MyChart, go to [email or web address].    Your next appointment:   6 month(s)  The format for your next appointment:   In Person  Provider:   You may see Minus Breeding, MD or one of the following Advanced Practice Providers on your designated Care Team:    Rosaria Ferries, PA-C  Jory Sims, DNP, ANP

## 2020-06-24 NOTE — Progress Notes (Signed)
Cardiology Office Note:    Date:  06/24/2020   ID:  Joe Blake, DOB 1965/10/15, MRN [id number]  PCP:  Charlott Rakes, MD  Cardiologist:  Minus Breeding, MD  Electrophysiologist:  None   Referring MD: Charlott Rakes, MD   No chief complaint on file.   History of Present Illness:    Joe Blake is a 54 y.o. male with a hx of presumed NICM, CAF, morbid obesity with a BMI of 50, OSA documented on sleep study Sept 2020 (not yet on BiPap), and past GI bleed Dec 2019. His last echo was Sept 2019- EF 30-35%.  He has done well from a cardiac standpoint since I spoke to him last (virtual visit).  He has had some issues with gout recently and has been on colchicine PRN. He has "good days and bad days".  He is following a low sodium diet.   Past Medical History:  Diagnosis Date  . Acute systolic HF (heart failure) ([place])   . Atrial fibrillation ([place])   . Diabetes mellitus without complication ([place])   . Hiatal hernia   . History of esophageal ulcer   . History of gastric ulcer   . Hypertension   . Morbid obesity ([place])     Past Surgical History:  Procedure Laterality Date  . ABDOMINAL SURGERY    . APPENDECTOMY     at 54 years old  . BIOPSY  08/11/2018   Procedure: BIOPSY;  Surgeon: Laurence Spates, MD;  Location: [facility] ENDOSCOPY;  Service: Endoscopy;;  . CARDIOVERSION N/A 03/18/2018   Procedure: CARDIOVERSION;  Surgeon: Skeet Latch, MD;  Location: [facility] ENDOSCOPY;  Service: Cardiovascular;  Laterality: N/A;  . ESOPHAGOGASTRODUODENOSCOPY (EGD) WITH PROPOFOL N/A 08/11/2018   Procedure: ESOPHAGOGASTRODUODENOSCOPY (EGD) WITH PROPOFOL;  Surgeon: Laurence Spates, MD;  Location: [facility] ENDOSCOPY;  Service: Endoscopy;  Laterality: N/A;    Current Medications: Current Meds  Medication Sig  . allopurinol (ZYLOPRIM) 300 MG tablet Take 1 tablet (300 mg total) by mouth daily.  . carvedilol (COREG) 25 MG tablet Take 1 tablet (25 mg total) by mouth 2 (two) times daily with a meal.  . COLCRYS 0.6 MG tablet  TAKE 1 TABLET (0.6 MG TOTAL) BY MOUTH ONCE AS NEEDED (TAKE 1.2MG  AND THEN 0.6MG  1 HOUR LATER IF STILL HAVING SYMPTOMS).  . furosemide (LASIX) 80 MG tablet Take 80 mg(1 tablets) in the morning and 40 mg(1/2 tablet) in the evening  . losartan (COZAAR) 50 MG tablet Take 1 tablet (50 mg total) by mouth daily.  . pantoprazole (PROTONIX) 40 MG tablet Take 1 tablet (40 mg total) by mouth daily.  . rivaroxaban (XARELTO) 20 MG TABS tablet Take 1 tablet (20 mg total) by mouth daily with supper.     Allergies:   Patient has no known allergies.   Social History   Socioeconomic History  . Marital status: Single    Spouse name: Not on file  . Number of children: Not on file  . Years of education: Not on file  . Highest education level: Not on file  Occupational History  . Not on file  Tobacco Use  . Smoking status: Never Smoker  . Smokeless tobacco: Never Used  Vaping Use  . Vaping Use: Never used  Substance and Sexual Activity  . Alcohol use: No  . Drug use: No  . Sexual activity: Yes    Partners: Female  Other Topics Concern  . Not on file  Social History Narrative  . Not on file   Social  Determinants of Health   Financial Resource Strain:   . Difficulty of Paying Living Expenses: Not on file  Food Insecurity:   . Worried About Charity fundraiser in the Last Year: Not on file  . Ran Out of Food in the Last Year: Not on file  Transportation Needs:   . Lack of Transportation (Medical): Not on file  . Lack of Transportation (Non-Medical): Not on file  Physical Activity:   . Days of Exercise per Week: Not on file  . Minutes of Exercise per Session: Not on file  Stress:   . Feeling of Stress : Not on file  Social Connections:   . Frequency of Communication with Friends and Family: Not on file  . Frequency of Social Gatherings with Friends and Family: Not on file  . Attends Religious Services: Not on file  . Active Member of Clubs or Organizations: Not on file  . Attends Theatre manager Meetings: Not on file  . Marital Status: Not on file     Family History: The patient's family history includes Heart attack in his paternal grandfather; Hyperlipidemia in his mother; Hypertension in his mother.  ROS:   Please see the history of present illness.     All other systems reviewed and are negative.  EKGs/Labs/Other Studies Reviewed:    The following studies were reviewed today: Echo Sept 2019- Study Conclusions   - Left ventricle: The cavity size was mildly dilated. Mildly  increased relative wall thickness. Systolic function was  moderately to severely reduced. The estimated ejection fraction  was in the range of 30% to 35%. Moderate diffuse hypokinesis with  no identifiable regional variations. Acoustic contrast  opacification revealed no evidence ofthrombus.  - Left atrium: The atrium was moderately dilated.  - Right ventricle: Systolic function was mildly reduced.  - Right atrium: The atrium was mildly dilated.  - Pericardium, extracardiac: A trivial pericardial effusion was  identified.   Impressions:   - Compared to 11/20/2017, there is mild improvement in both right  and left ventricular systolic function.   EKG:  EKG is ordered today.  The ekg ordered today demonstrates AF with IVCD, HR 113  Recent Labs: 06/26/2019: Platelets 268 07/23/2019: NT-Pro BNP 3,686 01/20/2020: BUN 21; Creatinine, Ser 1.53; Hemoglobin 11.8; Potassium 4.4; Sodium 145  Recent Lipid Panel No results found for: CHOL, TRIG, HDL, CHOLHDL, VLDL, LDLCALC, LDLDIRECT  Physical Exam:    VS:  BP 130/90   Pulse (!) 113   Ht 6\' 1"  (1.854 m)   Wt (!) 365 lb (165.6 kg)   SpO2 95%   BMI 48.16 kg/m     Wt Readings from Last 3 Encounters:  06/24/20 (!) 365 lb (165.6 kg)  06/01/20 (!) 365 lb (165.6 kg)  02/25/20 (!) 364 lb 9.6 oz (165.4 kg)     GEN: Obese Caucasian male, well developed in no acute distress HEENT: Normal NECK: No JVD; No carotid  bruits CARDIAC: irregularly irregular, no murmurs, rubs, gallops RESPIRATORY:  Clear to auscultation without rales, wheezing or rhonchi  ABDOMEN: Soft, non-tender, non-distended MUSCULOSKELETAL:  No edema; No deformity  SKIN: Warm and dry NEUROLOGIC:  Alert and oriented x 3 PSYCHIATRIC:  Normal affect   ASSESSMENT:    Chronic combined CHF- He appears to be stable- maintaining his weight.  He complains of frequent urination with diuretics and asked about an indwelling catheter. I suggested he speak to his PCP about this.  Sleep apnea- BiPap recommended and [facility] contacted  but the patient says he was denied because of lack of documentation.  I will ask Mariann Laster to look into this again  Persistent atrial fibrillation ([place]) AF with CVR-   Anticoagulated CHADS VASC=3, onXarelto  GI bleed H/O GI bleed- PUD- Dec 2019.   HTN (hypertension) controlled  CRI-3- Last SCr 1.53 May 2021  Morbid obesity- BMI 48  PLAN:    Same Rx- check labs today.  I encouraged him to consider COVID vaccination.  F/U in 6 months   Medication Adjustments/Labs and Tests Ordered: Current medicines are reviewed at length with the patient today.  Concerns regarding medicines are outlined above.  No orders of the defined types were placed in this encounter.  No orders of the defined types were placed in this encounter.   There are no Patient Instructions on file for this visit.   Signed, Kerin Ransom, PA-C  06/24/2020 2:18 PM    [facility]

## 2020-06-25 LAB — CBC
Hematocrit: 45.2 % (ref 37.5–51.0)
Hemoglobin: 14.7 g/dL (ref 13.0–17.7)
MCH: 28.5 pg (ref 26.6–33.0)
MCHC: 32.5 g/dL (ref 31.5–35.7)
MCV: 88 fL (ref 79–97)
Platelets: 272 10*3/uL (ref 150–450)
RBC: 5.15 x10E6/uL (ref 4.14–5.80)
RDW: 15.4 % (ref 11.6–15.4)
WBC: 8.4 10*3/uL (ref 3.4–10.8)

## 2020-06-25 LAB — BASIC METABOLIC PANEL
BUN/Creatinine Ratio: 15 (ref 9–20)
BUN: 21 mg/dL (ref 6–24)
CO2: 25 mmol/L (ref 20–29)
Calcium: 9.7 mg/dL (ref 8.7–10.2)
Chloride: 100 mmol/L (ref 96–106)
Creatinine, Ser: 1.4 mg/dL — ABNORMAL HIGH (ref 0.76–1.27)
GFR calc Af Amer: 66 mL/min/{1.73_m2} (ref >59)
GFR calc non Af Amer: 57 mL/min/{1.73_m2} — ABNORMAL LOW (ref >59)
Glucose: 96 mg/dL (ref 65–99)
Potassium: 4.3 mmol/L (ref 3.5–5.2)
Sodium: 141 mmol/L (ref 134–144)

## 2020-07-27 MED FILL — ALLOPURINOL 300 MG TAB: 300 | 90 days supply | Qty: 90 | Fill #0

## 2020-07-27 MED FILL — PANTOPRAZOLE SOD DR 40 MG T: 40 | 90 days supply | Qty: 90 | Fill #1

## 2020-07-27 MED FILL — CARVEDILOL 25 MG TABLET: 25 | 90 days supply | Qty: 180 | Fill #0

## 2020-08-10 MED FILL — CARVEDILOL 25 MG TABLET: 25 | 90 days supply | Qty: 180 | Fill #0

## 2020-08-10 MED FILL — PANTOPRAZOLE SOD DR 40 MG T: 40 | 90 days supply | Qty: 90 | Fill #1

## 2020-08-10 MED FILL — ALLOPURINOL 300 MG TAB: 300 | 90 days supply | Qty: 90 | Fill #0

## 2020-08-12 LAB — EXTERNAL GENERIC LAB PROCEDURE: COLOGUARD: NEGATIVE

## 2020-08-12 LAB — COLOGUARD
COLOGUARD: NEGATIVE
Cologuard: NEGATIVE

## 2020-08-18 ENCOUNTER — Other Ambulatory Visit: Payer: Self-pay

## 2020-09-12 ENCOUNTER — Encounter (INDEPENDENT_AMBULATORY_CARE_PROVIDER_SITE_OTHER): Payer: Self-pay

## 2020-11-04 MED FILL — ALLOPURINOL 300 MG TAB: 300 | 90 days supply | Qty: 90 | Fill #1

## 2020-11-04 MED FILL — XARELTO 20 MG TABLET: 20 | 90 days supply | Qty: 90 | Fill #0

## 2020-11-04 MED FILL — LOSARTAN POTASSIUM 50 MG TA: 50 | 90 days supply | Qty: 90 | Fill #1

## 2020-11-04 MED FILL — CARVEDILOL 25 MG TABLET: 25 | 90 days supply | Qty: 180 | Fill #1

## 2020-11-04 MED FILL — FUROSEMIDE 80 MG TAB: 80 | 90 days supply | Qty: 135 | Fill #1

## 2020-11-04 MED FILL — PANTOPRAZOLE SOD DR 40 MG T: 40 | 90 days supply | Qty: 90 | Fill #0

## 2020-11-11 ENCOUNTER — Telehealth: Payer: Self-pay | Admitting: *Deleted

## 2020-11-11 NOTE — Telephone Encounter (Signed)
Called patient and informed him that due to time lapse he will need appointment to be seen by Dr Claiborne Billings. Appointment given for Monday. March 14th.

## 2020-11-15 ENCOUNTER — Other Ambulatory Visit: Payer: Self-pay

## 2020-11-15 ENCOUNTER — Other Ambulatory Visit: Payer: Self-pay | Admitting: Cardiovascular Disease

## 2020-11-15 ENCOUNTER — Encounter: Payer: Self-pay | Admitting: Cardiovascular Disease

## 2020-11-15 ENCOUNTER — Ambulatory Visit (INDEPENDENT_AMBULATORY_CARE_PROVIDER_SITE_OTHER): Payer: Medicaid Other | Admitting: Cardiovascular Disease

## 2020-11-15 VITALS — BP 140/92 | HR 123 | Ht 73.0 in | Wt 359.0 lb

## 2020-11-15 DIAGNOSIS — Z79899 Other long term (current) drug therapy: Secondary | ICD-10-CM | POA: Diagnosis not present

## 2020-11-15 DIAGNOSIS — I4819 Other persistent atrial fibrillation: Secondary | ICD-10-CM | POA: Diagnosis not present

## 2020-11-15 DIAGNOSIS — G4733 Obstructive sleep apnea (adult) (pediatric): Secondary | ICD-10-CM

## 2020-11-15 DIAGNOSIS — I10 Essential (primary) hypertension: Secondary | ICD-10-CM | POA: Diagnosis not present

## 2020-11-15 DIAGNOSIS — I428 Other cardiomyopathies: Secondary | ICD-10-CM | POA: Diagnosis not present

## 2020-11-15 DIAGNOSIS — Z7901 Long term (current) use of anticoagulants: Secondary | ICD-10-CM | POA: Diagnosis not present

## 2020-11-15 DIAGNOSIS — I447 Left bundle-branch block, unspecified: Secondary | ICD-10-CM

## 2020-11-15 DIAGNOSIS — I4891 Unspecified atrial fibrillation: Secondary | ICD-10-CM

## 2020-11-15 MED ORDER — METOPROLOL SUCCINATE ER 50 MG PO TB24: 50.0000 mg | ORAL_TABLET | Freq: Every day | ORAL | 3 refills | Status: DC

## 2020-11-15 NOTE — Patient Instructions (Addendum)
Medication Instructions:  Stop taking Carvedilol   Start taking Metoprolol Succinate 50 mg one tablet  Daily    *If you need a refill on your cardiac medications before your next appointment, please call your pharmacy*   Lab Work:  Not needed   Testing/Procedures: Not needed  Follow-Up: At [facility], you and your health needs are our priority.  As part of our continuing mission to provide you with exceptional heart care, we have created designated Provider Care Teams.  These Care Teams include your primary Cardiologist (physician) and Advanced Practice Providers (APPs -  Physician Assistants and Nurse Practitioners) who all work together to provide you with the care you need, when you need it.     Your next appointment:   3 month(s)- Sleep Clinic  The format for your next appointment:   In Person  Provider:   Shelva Majestic, MD   Other Instructions  will be ordering a new  Bi-Pap machine -through [facility]

## 2020-11-15 NOTE — Progress Notes (Signed)
Cardiology Office Note    Date:  11/21/2020   ID:  Joe, Blake 1966-03-03, MRN [id number]  PCP:  Joe Rakes, MD  Cardiologist:  Joe Majestic, MD   New sleep evaluation  History of Present Illness:  Joe Blake is a 55 y.o. male who is a former patient of Dr. Percival Blake and now sees Dr. Charlott Blake.  I he has a history of atrial fibrillation morbid obesity and remote GI bleeding.  In November 2019 due to concerns for obstructive sleep apnea he was referred for a home sleep study which revealed severe sleep apnea with an AHI of 53.2/h.  He has severe oxygen desaturation to a nadir of 60% and time spent less than 89% was 133 minutes.  At that time, the plan was to refer him for a subsequent titration study.  Apparently, with the Covid pandemic, his titration study was canceled.  Ultimately he underwent a BiPAP titration study on May 29, 2019 and he required maximum titration.  AHI 25/21 was 10.7 and he was titrated in the lab up to 26/22.  He had severe oxygen desaturation to 75% at 15 cm of water pressure.  He was in his permanent atrial fibrillation.  Apparently, he was never set up for BiPAP for some reason.  Typically he goes to bed around midnight and wakes up between 9 and 10 AM.  He admits to some leg swelling.  He has been on a medical regimen which is supposed to be carvedilol 25 mg twice a day but he is only taking 12.5 mg daily, Lasix 80 mg in the morning and 40 mg at 4 PM, losartan 50 mg daily in addition to Xarelto.  He has a history of hypertension, and anemia.  Echo Doppler study in September 2019 showed an EF of 30 to 35%.  There was moderate diffuse hypokinesis.  There is no evidence for thrombus.  There was some discussion concerning Entresto but apparently this had never been started.  He has difficulty with sleep.  He admits to snoring.  Admits to some leg swelling.  An Epworth Sleepiness Scale score was calculated in the office today and this endorsed at 11 as shown  below:  Epworth Sleepiness Scale: Situation   Chance of Dozing/Sleeping (0 = never , 1 = slight chance , 2 = moderate chance , 3 = high chance )   sitting and reading 1   watching TV 1   sitting inactive in a public place 2   being a passenger in a motor vehicle for an hour or more 2   lying down in the afternoon 2   sitting and talking to someone 1   sitting quietly after lunch (no alcohol) 2   while stopped for a few minutes in traffic as the driver 1   Total Score  11   He admits to snoring and daytime sleepiness.  He is unaware of bruxism.  He is unaware of No gagging hallucinations or cataplectic events.  Past Medical History:  Diagnosis Date  . Acute systolic HF (heart failure) ([place])   . Atrial fibrillation ([place])   . Diabetes mellitus without complication ([place])   . Hiatal hernia   . History of esophageal ulcer   . History of gastric ulcer   . Hypertension   . Morbid obesity ([place])     Past Surgical History:  Procedure Laterality Date  . ABDOMINAL SURGERY    . APPENDECTOMY     at 55 years  old  . BIOPSY  08/11/2018   Procedure: BIOPSY;  Surgeon: Laurence Spates, MD;  Location: [facility] ENDOSCOPY;  Service: Endoscopy;;  . CARDIOVERSION N/A 03/18/2018   Procedure: CARDIOVERSION;  Surgeon: Skeet Latch, MD;  Location: [facility]COPY;  Service: Cardiovascular;  Laterality: N/A;  . ESOPHAGOGASTRODUODENOSCOPY (EGD) WITH PROPOFOL N/A 08/11/2018   Procedure: ESOPHAGOGASTRODUODENOSCOPY (EGD) WITH PROPOFOL;  Surgeon: Laurence Spates, MD;  Location: [facility] ENDOSCOPY;  Service: Endoscopy;  Laterality: N/A;    Current Medications: Outpatient Medications Prior to Visit  Medication Sig Dispense Refill  . allopurinol (ZYLOPRIM) 300 MG tablet Take 1 tablet (300 mg total) by mouth daily. 30 tablet 6  . furosemide (LASIX) 80 MG tablet Take 80 mg(1 tablets) in the morning and 40 mg(1/2 tablet) in the evening 135 tablet 3  . losartan (COZAAR) 50 MG tablet Take 1 tablet (50 mg total) by mouth daily. 90  tablet 3  . pantoprazole (PROTONIX) 40 MG tablet Take 1 tablet (40 mg total) by mouth daily. 90 tablet 1  . rivaroxaban (XARELTO) 20 MG TABS tablet Take 1 tablet (20 mg total) by mouth daily with supper. 90 tablet 3  . carvedilol (COREG) 25 MG tablet Take 1 tablet (25 mg total) by mouth 2 (two) times daily with a meal. 180 tablet 2  . COLCRYS 0.6 MG tablet TAKE 1 TABLET (0.6 MG TOTAL) BY MOUTH ONCE AS NEEDED (TAKE 1.2MG AND THEN 0.6MG 1 HOUR LATER IF STILL HAVING SYMPTOMS). (Patient not taking: Reported on 11/15/2020) 30 tablet 2   No facility-administered medications prior to visit.     Allergies:   Patient has no known allergies.   Social History   Socioeconomic History  . Marital status: Single    Spouse name: Not on file  . Number of children: Not on file  . Years of education: Not on file  . Highest education level: Not on file  Occupational History  . Not on file  Tobacco Use  . Smoking status: Never Smoker  . Smokeless tobacco: Never Used  Vaping Use  . Vaping Use: Never used  Substance and Sexual Activity  . Alcohol use: No  . Drug use: No  . Sexual activity: Yes    Partners: Female  Other Topics Concern  . Not on file  Social History Narrative  . Not on file   Social Determinants of Health   Financial Resource Strain: Not on file  Food Insecurity: Not on file  Transportation Needs: Not on file  Physical Activity: Not on file  Stress: Not on file  Social Connections: Not on file     Family History:  The patient's family history includes Heart attack in his paternal grandfather; Hyperlipidemia in his mother; Hypertension in his mother.   ROS General: Negative; No fevers, chills, or night sweats;  HEENT: Negative; No changes in vision or hearing, sinus congestion, difficulty swallowing Pulmonary: Negative; No cough, wheezing, shortness of breath, hemoptysis Cardiovascular: Chronic atrial fibrillation, hypertension GI: GERD GU: Negative; No dysuria,  hematuria, or difficulty voiding Musculoskeletal: Negative; no myalgias, joint pain, or weakness Hematologic/Oncology: Negative; no easy bruising, bleeding Endocrine: Negative; no heat/cold intolerance; no diabetes Neuro: Negative; no changes in balance, headaches Skin: Negative; No rashes or skin lesions Psychiatric: Negative; No behavioral problems, depression Sleep: Positive for snoring, daytime sleepiness, nobruxism, restless legs, hypnogognic hallucinations, no cataplexy Other comprehensive 14 point system review is negative.   PHYSICAL EXAM:   VS:  BP (!) 140/92 (BP Location: Left Arm, Patient Position: Sitting)   Pulse Marland Kitchen)  123   Ht 6' 1"  (1.854 m)   Wt (!) 359 lb (162.8 kg)   SpO2 92%   BMI 47.36 kg/m     Repeat blood pressure by me was 140/90  Wt Readings from Last 3 Encounters:  11/15/20 (!) 359 lb (162.8 kg)  06/24/20 (!) 365 lb (165.6 kg)  06/01/20 (!) 365 lb (165.6 kg)    General: Alert, oriented, no distress.  Skin: normal turgor, no rashes, warm and dry HEENT: Normocephalic, atraumatic. Pupils equal round and reactive to light; sclera anicteric; extraocular muscles intact;  Nose without nasal septal hypertrophy Mouth/Parynx benign; Mallinpatti scale3/4 Neck: No JVD, no carotid bruits; normal carotid upstroke Lungs: clear to ausculatation and percussion; no wheezing or rales Chest wall: without tenderness to palpitation Heart: PMI not displaced, tachycardic and irregularly irregular consistent with A. fib, s1 s2 normal, 1/6 systolic murmur, no diastolic murmur, no rubs, gallops, thrills, or heaves Abdomen: soft, nontender; no hepatosplenomehaly, BS+; abdominal aorta nontender and not dilated by palpation. Back: no CVA tenderness Pulses 2+ Musculoskeletal: full range of motion, normal strength, no joint deformities Extremities: 1+ lower extremity edema, no clubbing cyanosis  Homan's sign negative  Neurologic: grossly nonfocal; Cranial nerves grossly  wnl Psychologic: Normal mood and affect   Studies/Labs Reviewed:   EKG:  EKG is ordered today.  ECG (independently read by me): Atrial fibrillation at 123, LBBB  Recent Labs: BMP Latest Ref Rng & Units 06/24/2020 01/20/2020 07/23/2019  Glucose 65 - 99 mg/dL 96 125(H) 117(H)  BUN 6 - 24 mg/dL 21 21 22   Creatinine 0.76 - 1.27 mg/dL 1.40(H) 1.53(H) 1.65(H)  BUN/Creat Ratio 9 - 20 15 14 13   Sodium 134 - 144 mmol/L 141 145(H) 141  Potassium 3.5 - 5.2 mmol/L 4.3 4.4 4.6  Chloride 96 - 106 mmol/L 100 106 101  CO2 20 - 29 mmol/L 25 21 24   Calcium 8.7 - 10.2 mg/dL 9.7 9.3 9.5     Hepatic Function Latest Ref Rng & Units 11/20/2018 08/10/2018 11/23/2017  Total Protein 6.0 - 8.5 g/dL 6.6 6.7 -  Albumin 3.8 - 4.9 g/dL 3.9 3.6 3.2(L)  AST 0 - 40 IU/L 14 20 -  ALT 0 - 44 IU/L 9 14 -  Alk Phosphatase 39 - 117 IU/L 94 54 -  Total Bilirubin 0.0 - 1.2 mg/dL 0.2 1.0 -    CBC Latest Ref Rng & Units 06/24/2020 01/20/2020 06/26/2019  WBC 3.4 - 10.8 x10E3/uL 8.4 9.6 6.9  Hemoglobin 13.0 - 17.7 g/dL 14.7 11.8(L) 9.9(L)  Hematocrit 37.5 - 51.0 % 45.2 42.0 33.7(L)  Platelets 150 - 450 x10E3/uL 272 - 268   Lab Results  Component Value Date   MCV 88 06/24/2020   MCV 82 01/20/2020   MCV 77 (L) 06/26/2019   Lab Results  Component Value Date   TSH 4.111 11/22/2017   Lab Results  Component Value Date   HGBA1C 5.6 08/11/2018     BNP    Component Value Date/Time   BNP 144.2 (H) 02/24/2019 1552   BNP 248.5 (H) 11/19/2017 2010    ProBNP    Component Value Date/Time   PROBNP 3,686 (H) 07/23/2019 1544     Lipid Panel  No results found for: CHOL, TRIG, HDL, CHOLHDL, VLDL, LDLCALC, LDLDIRECT, LABVLDL   RADIOLOGY: No results found.   Additional studies/ records that were reviewed today include:   07/28/2018 CLINICAL INFORMATION Sleep Study Type: HST  Indication for sleep study: snoring, OSA  Epworth Sleepiness Score: 5  SLEEP STUDY  TECHNIQUE A multi-channel overnight  portable sleep study was performed. The channels recorded were: nasal airflow, thoracic respiratory movement, and oxygen saturation with a pulse oximetry. Snoring was also monitored.  MEDICATIONS     allopurinol (ZYLOPRIM) 300 MG tablet             carvedilol (COREG) 25 MG tablet         colchicine 0.6 MG tablet         furosemide (LASIX) 40 MG tablet         losartan (COZAAR) 50 MG tablet         rivaroxaban (XARELTO) 20 MG TABS tablet      Patient self administered medications include: N/A.  SLEEP ARCHITECTURE Patient was studied for 357.3 minutes. The sleep efficiency was 100.0 % and the patient was supine for 69.5%. The arousal index was 0.0 per hour.  RESPIRATORY PARAMETERS The overall AHI was 53.2 per hour, with a central apnea index of 0.0 per hour.  The oxygen nadir was 60% during sleep.  CARDIAC DATA Mean heart rate during sleep was 82.6 bpm.  IMPRESSIONS - Severe obstructive sleep apnea occurred during this study (AHI = 53.2/h). - No significant central sleep apnea occurred during this study (CAI = 0.0/h). - Severe oxygen desaturation to a nadir of  60%. Time spent < 89% was 133.1 minutes. - Patient snored 34.3% during the sleep.  DIAGNOSIS - Obstructive Sleep Apnea (327.23 [G47.33 ICD-10]) - Nocturnal Hypoxemia (327.26 [G47.36 ICD-10])  RECOMMENDATIONS - In this patient with significant cardiovascular comorbidities including CHF,  atrial fibrillation and severe OSA with significant oxygen desaturation to 60%, recommend an in-lab CPAP titration study.  - Efforts should be made to optimize nasal and oropharyngeal patency. - Positional therapy avoiding supine position during sleep. - Avoid alcohol, sedatives and other CNS depressants that may worsen sleep apnea and disrupt normal sleep architecture. - Sleep hygiene should be reviewed to assess factors that may improve sleep quality. - Weight management (BMI 50) and regular exercise should be initiated or  continued  05/29/2019 CLINICAL INFORMATION The patient is referred for a BiPAP titration to treat sleep apnea.  Date of HST: 07/28/2018:  AHI 53.2/h; O2 saturation nadir 60% with 133 minutes < 89%.  SLEEP STUDY TECHNIQUE As per the AASM Manual for the Scoring of Sleep and Associated Events v2.3 (April 2016) with a hypopnea requiring 4% desaturations.  The channels recorded and monitored were frontal, central and occipital EEG, electrooculogram (EOG), submentalis EMG (chin), nasal and oral airflow, thoracic and abdominal wall motion, anterior tibialis EMG, snore microphone, electrocardiogram, and pulse oximetry. Bilevel positive airway pressure (BPAP) was initiated at the beginning of the study and titrated to treat sleep-disordered breathing.  MEDICATIONS  allopurinol (ZYLOPRIM) 300 MG tablet    carvedilol (COREG) 25 MG tablet    COLCRYS 0.6 MG tablet    furosemide (LASIX) 40 MG tablet    losartan (COZAAR) 50 MG tablet    pantoprazole (PROTONIX) 40 MG tablet    rivaroxaban (XARELTO) 20 MG TABS tablet    Medications self-administered by patient taken the night of the study : N/A  RESPIRATORY PARAMETERS Optimal IPAP Pressure (cm): 26        AHI at Optimal Pressure (/hr) 0.0 Optimal EPAP Pressure (cm):            22          Overall Minimal O2 (%):         75.0     Minimal O2 at  Optimal Pressure (%): 90.0  SLEEP ARCHITECTURE Start Time:      10:22:32 PM    Stop Time:       4:59:20 AM      Total Time (min):         396.8   Total Sleep Time (min):      388 Sleep Latency (min):   7.2       Sleep Efficiency (%):   97.8%  REM Latency (min):    48.0     WASO (min):  1.6 Stage N1 (%): 0.8%    Stage N2 (%): 50.4%  Stage N3 (%): 12.9%  Stage R (%):   36 Supine (%):     61.23   Arousal Index (/hr):     8.4         CARDIAC DATA The 2 lead EKG demonstrated sinus rhythm. The mean heart rate was 92.4 beats per minute. Other EKG findings include: Atrial Fibrillation, PVCs.  LEG  MOVEMENT DATA The total Periodic Limb Movements of Sleep (PLMS) were 0. The PLMS index was 0.0. A PLMS index of <15 is considered normal in adults.  IMPRESSIONS - CPAP was initiated at 7 cm and titrated to 21 cm. Due to continued events BiPAP was initiated at 23/19 and was titrated to 26/22. AHI at 25/21 was 10.7/h and at 26/22  was 0 with O2 nadir at 90%. - Central sleep apnea was not noted during this titration (CAI = 0.0/h). - Severe oxygen desaturations to a nadir of 75% at 15 cm CPAP pressure. - No snoring was audible during this study. - 2-lead EKG demonstrated: Atrial Fibrillation, PVCs - Clinically significant periodic limb movements were not noted during this study. Arousals associated with PLMs were rare.  DIAGNOSIS - Obstructive Sleep Apnea (327.23 [G47.33 ICD-10])  RECOMMENDATIONS - Recommend an initial trial of BiPAP therapy with EPR at 26/22 cm H2O with heated humidification. A Large size Fisher[employer]Paykel Full Face Mask Simplus mask was used for the titration. - Effort should be made to optimize nasal and oropharyngeal patency. - Avoid alcohol, sedatives and other CNS depressants that may worsen sleep apnea and disrupt normal sleep architecture. - Sleep hygiene should be reviewed to assess factors that may improve sleep quality. - Weight management and regular exercise should be initiated or continued. - Recommend a download in 30 days and sleep clinic evaluation after 4 weeks of therapy.  ASSESSMENT:    1. Obstructive sleep apnea syndrome   2. Nonischemic cardiomyopathy ([place])   3. Atrial fibrillation with RVR (H[facility])   4. Persistent atrial fibrillation ([place])   5. Chronic anticoagulation   6. Essential hypertension   7. LBBB (left bundle branch block)   8. Obesity, Class III, BMI 40-49.9 (morbid obesity) ([place])   9. Medication management     PLAN:   Joe Blake is a 55 year old gentleman who has a history of hypertension, reduced LV function with a preserved  nonischemic cardiomyopathy and EF of 30 to 35%, who was diagnosed with severe obstructive sleep apnea.  He has a history of remote GI bleed.  Due to the COVID-19 pandemic, his initial CPAP/BiPAP titration study was delayed but he ultimately underwent a titration study in September 2020 and required maximum dose BiPAP therapy.  He ultimately was prescribed BiPAP therapy but for some reason this is never yet been instituted.  Patient continues to have issues with sleep.  He typically is in bed for approximately 9 hours per night.  With the severity of his  sleep apnea, I will see if we can initiate BiPAP auto therapy with an EPAP minimum of 16 IPAP maximum of 25 and pressure support of 4.  However if insurance denies BiPAP auto therapy without another study we will schedule him for a home study and then BiPAP auto therapy subsequently.  I had a long discussion with him regarding the effects of untreated sleep apnea on his cardiovascular health.  In particular I discussed its effect on blood pressure control, nocturnal arrhythmias, potential contribution to his development of chronic atrial fibrillation, as well as potential for nocturnal ischemia in the setting of apnea mediated nocturnal hypoxia.  I also discussed this implications on glucose metabolism as well as GERD.  He has a history of persistent longstanding atrial fibrillation.  He also has significant LV dysfunction.  He may ultimately be a candidate for Entresto depending upon insurance and ability to obtain the drug.  Presently he has only been taking his carvedilol 12.5 mg daily instead of 25 mg twice a day.  His atrial fibrillation rate is rapid today at 123.  I have recommended he discontinue his once a day carvedilol and in its place will initiate metoprolol succinate 50 mg.  He continues to be anticoagulated on Xarelto.  He is on furosemide 80 mg in the morning and 40 mg at night and losartan.  He may also benefit from subsequent initiation of  spironolactone, but I will for this to his primary cardiologist.  We discussed the importance of weight loss and increased exercise.  BMI is consistent with morbid obesity.  I will see him in several months for follow-up evaluation or sooner as needed.  Medication Adjustments/Labs and Tests Ordered: Current medicines are reviewed at length with the patient today.  Concerns regarding medicines are outlined above.  Medication changes, Labs and Tests ordered today are listed in the Patient Instructions below. Patient Instructions  Medication Instructions:  Stop taking Carvedilol   Start taking Metoprolol Succinate 50 mg one tablet  Daily    *If you need a refill on your cardiac medications before your next appointment, please call your pharmacy*   Lab Work:  Not needed   Testing/Procedures: Not needed  Follow-Up: At [facility], you and your health needs are our priority.  As part of our continuing mission to provide you with exceptional heart care, we have created designated Provider Care Teams.  These Care Teams include your primary Cardiologist (physician) and Advanced Practice Providers (APPs -  Physician Assistants and Nurse Practitioners) who all work together to provide you with the care you need, when you need it.     Your next appointment:   3 month(s)- Sleep Clinic  The format for your next appointment:   In Person  Provider:   Shelva Majestic, MD   Other Instructions  will be ordering a new  Bi-Pap machine -through [facility]    Signed, Joe Majestic, MD  11/21/2020 6:43 PM    [facility] [address] [place], [place], [place]  [address] Phone: [phone]

## 2020-11-17 ENCOUNTER — Telehealth: Payer: Self-pay | Admitting: *Deleted

## 2020-11-17 NOTE — Telephone Encounter (Signed)
PA request for split night sleep study submitted to [facility] via fax.

## 2020-11-21 ENCOUNTER — Encounter: Payer: Self-pay | Admitting: Cardiovascular Disease

## 2020-12-04 ENCOUNTER — Other Ambulatory Visit: Payer: Self-pay

## 2020-12-23 ENCOUNTER — Other Ambulatory Visit: Payer: Self-pay

## 2020-12-23 MED FILL — Metoprolol Succinate Tab ER 24HR 50 MG (Tartrate Equiv): ORAL | 90 days supply | Qty: 90 | Fill #0 | Status: CN

## 2020-12-30 ENCOUNTER — Other Ambulatory Visit: Payer: Self-pay

## 2020-12-30 MED FILL — Metoprolol Succinate Tab ER 24HR 50 MG (Tartrate Equiv): ORAL | 30 days supply | Qty: 30 | Fill #0 | Status: AC

## 2020-12-31 ENCOUNTER — Other Ambulatory Visit (HOSPITAL_COMMUNITY): Payer: Self-pay

## 2021-01-07 ENCOUNTER — Other Ambulatory Visit: Payer: Self-pay

## 2021-01-07 MED ORDER — METOPROLOL SUCCINATE ER 50 MG PO TB24
ORAL_TABLET | Freq: Every day | ORAL | 3 refills | Status: DC
  Filled 2021-01-07: qty 90, fill #0
  Filled 2021-02-04: qty 90, 90d supply, fill #0

## 2021-02-03 ENCOUNTER — Encounter: Payer: Self-pay | Admitting: Family Medicine

## 2021-02-04 ENCOUNTER — Other Ambulatory Visit: Payer: Self-pay | Admitting: Family Medicine

## 2021-02-04 ENCOUNTER — Other Ambulatory Visit: Payer: Self-pay

## 2021-02-04 DIAGNOSIS — D62 Acute posthemorrhagic anemia: Secondary | ICD-10-CM

## 2021-02-04 DIAGNOSIS — M1A0790 Idiopathic chronic gout, unspecified ankle and foot, without tophus (tophi): Secondary | ICD-10-CM

## 2021-02-04 MED ORDER — ALLOPURINOL 300 MG PO TABS
ORAL_TABLET | Freq: Every day | ORAL | 2 refills | Status: DC
  Filled 2021-02-04: qty 90, 90d supply, fill #0

## 2021-02-04 MED ORDER — PANTOPRAZOLE SODIUM 40 MG PO TBEC
DELAYED_RELEASE_TABLET | Freq: Every day | ORAL | 0 refills | Status: DC
  Filled 2021-02-04: qty 90, 90d supply, fill #0

## 2021-02-04 MED FILL — Losartan Potassium Tab 50 MG: ORAL | 90 days supply | Qty: 90 | Fill #0 | Status: AC

## 2021-02-04 MED FILL — Furosemide Tab 80 MG: ORAL | 90 days supply | Qty: 135 | Fill #0 | Status: AC

## 2021-02-04 MED FILL — Rivaroxaban Tab 20 MG: ORAL | 90 days supply | Qty: 90 | Fill #0 | Status: AC

## 2021-02-04 NOTE — Telephone Encounter (Signed)
Medication: Requesting 90 day script for the following - Rx #: [id number] allopurinol (ZYLOPRIM) 300 MG tablet [[id number]] , Rx #: [id number]  pantoprazole (PROTONIX) 40 MG tablet [[id number]]    Has the patient contacted their pharmacy? YES  (Agent: If no, request that the patient contact the pharmacy for the refill.) (Agent: If yes, when and what did the pharmacy advise?)  Preferred Pharmacy (with phone number or street name): [address] [place] [address] Phone: [phone] Fax: [phone] Hours: M-F 8:30a-5:30p    Agent: Please be advised that RX refills may take up to 3 business days. We ask that you follow-up with your pharmacy.

## 2021-02-07 ENCOUNTER — Other Ambulatory Visit: Payer: Self-pay

## 2021-02-10 ENCOUNTER — Other Ambulatory Visit: Payer: Self-pay

## 2021-03-08 ENCOUNTER — Ambulatory Visit (INDEPENDENT_AMBULATORY_CARE_PROVIDER_SITE_OTHER): Payer: Medicaid Other | Admitting: Cardiovascular Disease

## 2021-03-08 ENCOUNTER — Other Ambulatory Visit: Payer: Self-pay

## 2021-03-08 ENCOUNTER — Encounter: Payer: Self-pay | Admitting: Cardiovascular Disease

## 2021-03-08 VITALS — BP 155/91 | HR 116 | Ht 73.0 in | Wt 355.4 lb

## 2021-03-08 DIAGNOSIS — I447 Left bundle-branch block, unspecified: Secondary | ICD-10-CM

## 2021-03-08 DIAGNOSIS — I4891 Unspecified atrial fibrillation: Secondary | ICD-10-CM

## 2021-03-08 DIAGNOSIS — G4733 Obstructive sleep apnea (adult) (pediatric): Secondary | ICD-10-CM | POA: Diagnosis not present

## 2021-03-08 DIAGNOSIS — Z7901 Long term (current) use of anticoagulants: Secondary | ICD-10-CM | POA: Diagnosis not present

## 2021-03-08 DIAGNOSIS — I428 Other cardiomyopathies: Secondary | ICD-10-CM

## 2021-03-08 MED ORDER — METOPROLOL SUCCINATE ER 100 MG PO TB24
100.0000 mg | ORAL_TABLET | Freq: Every day | ORAL | 3 refills | Status: DC
  Filled 2021-03-08 – 2021-05-06 (×2): qty 90, 90d supply, fill #0
  Filled 2021-08-12: qty 90, 90d supply, fill #1
  Filled 2021-11-17: qty 90, 90d supply, fill #0
  Filled 2022-02-07: qty 90, 90d supply, fill #1

## 2021-03-08 NOTE — Progress Notes (Signed)
Cardiology Office Note    Date:  03/11/2021   ID:  Joe Blake, DOB June 17, 1966, MRN [id number]  PCP:  Charlott Rakes, MD  Cardiologist:  Shelva Majestic, MD (sleep); Dr. Percival Spanish  New sleep evaluation  History of Present Illness:  Joe Blake is a 55 y.o. male who is a patient of Dr. Percival Spanish and sees Dr. Charlott Rakes.  He has a history of atrial fibrillation morbid obesity and remote GI bleeding.  In November 2019 due to concerns for obstructive sleep apnea he was referred for a home sleep study which revealed severe sleep apnea with an AHI of 53.2/h.  He has severe oxygen desaturation to a nadir of 60% and time spent less than 89% was 133 minutes.  At that time, the plan was to refer him for a subsequent titration study.  Apparently, with the Covid pandemic, his titration study was canceled.  Ultimately he underwent a BiPAP titration study on May 29, 2019 and he required maximum titration.  AHI at  25/21 was 10.7 and he was titrated in the lab up to 26/22.  He had severe oxygen desaturation to 75% at 15 cm of water pressure.  He was in his permanent atrial fibrillation.    I saw him for my initial sleep evaluation on November 15, 2020.  Apparently, he was never set up for BiPAP for some reason.  Typically he goes to bed around midnight and wakes up between 9 and 10 AM.  He admits to some leg swelling.  He has been on a medical regimen which is supposed to be carvedilol 25 mg twice a day but he is only taking 12.5 mg daily, Lasix 80 mg in the morning and 40 mg at 4 PM, losartan 50 mg daily in addition to Xarelto.  He has a history of hypertension, and anemia.  Echo Doppler study in September 2019 showed an EF of 30 to 35%.  There was moderate diffuse hypokinesis.  There is no evidence for thrombus.  There was some discussion concerning Entresto but apparently this had never been started.  He has difficulty with sleep.  He admits to snoring.  Admits to some leg swelling.  An Epworth Sleepiness Scale  score was calculated in the office today and this endorsed at 11 as shown below:  Epworth Sleepiness Scale: Situation   Chance of Dozing/Sleeping (0 = never , 1 = slight chance , 2 = moderate chance , 3 = high chance )   sitting and reading 1   watching TV 1   sitting inactive in a public place 2   being a passenger in a motor vehicle for an hour or more 2   lying down in the afternoon 2   sitting and talking to someone 1   sitting quietly after lunch (no alcohol) 2   while stopped for a few minutes in traffic as the driver 1   Total Score  11   He admits to snoring and daytime sleepiness.  He is unaware of bruxism.  He is unaware of hypnagogic hallucinations or cataplectic events.  During my initial evaluation, I had a lengthy discussion with him regarding the effects of untreated sleep apnea on his cardiovascular health and in particular discussed its effect on blood pressure control, nocturnal arrhythmias, potential contribution to development of atrial fibrillation as well as nocturnal ischemia in the setting of apnea mediated aided nocturnal hypoxemia.  I also discussed its effects on glucose metabolism, inflammation, and GERD.  He  has a history of persistent longstanding atrial fibrillation and during my initial evaluation atrial fibrillation rate was rapid at 123.  At that time since he had not seen Dr. Percival Spanish in a long time I recommended he discontinue carvedilol which she was only taking once a day and in its place initiated metoprolol succinate 50 mg daily.  He was on furosemide 80 mg in the morning and 40 mg at night in addition to losartan.  I discussed the possibility of future titration to [facility] with his reduced LV function.  Since I last saw him, he apparently there has been difficulty with insurance.  We initially tried to consider initiating BiPAP auto therapy.  If insurance denied BiPAP auto therapy without another study the plan was to schedule him for home study and then  initiate BiPAP auto therapy subsequently.  Apparently insurance denied his follow-up study.  He has not been on treatment.  He has been going to bed around midnight and waking up at 9 AM.  He presents for evaluation.  Past Medical History:  Diagnosis Date   Acute systolic HF (heart failure) (HCC)    Atrial fibrillation (HCC)    Diabetes mellitus without complication ([place])    Hiatal hernia    History of esophageal ulcer    History of gastric ulcer    Hypertension    Morbid obesity ([place])     Past Surgical History:  Procedure Laterality Date   ABDOMINAL SURGERY     APPENDECTOMY     at 55 years old   BIOPSY  08/11/2018   Procedure: BIOPSY;  Surgeon: Laurence Spates, MD;  Location: [facility];  Service: Endoscopy;;   CARDIOVERSION N/A 03/18/2018   Procedure: CARDIOVERSION;  Surgeon: Skeet Latch, MD;  Location: [facility];  Service: Cardiovascular;  Laterality: N/A;   ESOPHAGOGASTRODUODENOSCOPY (EGD) WITH PROPOFOL N/A 08/11/2018   Procedure: ESOPHAGOGASTRODUODENOSCOPY (EGD) WITH PROPOFOL;  Surgeon: Laurence Spates, MD;  Location: [facility];  Service: Endoscopy;  Laterality: N/A;    Current Medications: Outpatient Medications Prior to Visit  Medication Sig Dispense Refill   allopurinol (ZYLOPRIM) 300 MG tablet TAKE 1 TABLET (300 MG TOTAL) BY MOUTH DAILY. 30 tablet 2   COLCRYS 0.6 MG tablet TAKE 1 TABLET (0.6 MG TOTAL) BY MOUTH ONCE AS NEEDED (TAKE 1.2MG AND THEN 0.6MG 1 HOUR LATER IF STILL HAVING SYMPTOMS). 30 tablet 2   furosemide (LASIX) 80 MG tablet TAKE 1 TABLETS BY MOUTH IN THE MORNING AND 1/2 TABLET IN THE EVENING 135 tablet 3   losartan (COZAAR) 50 MG tablet TAKE 1 TABLET (50 MG TOTAL) BY MOUTH DAILY. 90 tablet 3   pantoprazole (PROTONIX) 40 MG tablet TAKE 1 TABLET (40 MG TOTAL) BY MOUTH DAILY. 90 tablet 0   rivaroxaban (XARELTO) 20 MG TABS tablet TAKE 1 TABLET (20 MG TOTAL) BY MOUTH DAILY WITH SUPPER. 90 tablet 3   metoprolol succinate (TOPROL-XL) 50 MG 24 hr tablet TAKE 1  TABLET (50 MG TOTAL) BY MOUTH DAILY. TAKE WITH OR IMMEDIATELY FOLLOWING A MEAL. 90 tablet 3   No facility-administered medications prior to visit.     Allergies:   Patient has no known allergies.   Social History   Socioeconomic History   Marital status: Single    Spouse name: Not on file   Number of children: Not on file   Years of education: Not on file   Highest education level: Not on file  Occupational History   Not on file  Tobacco Use   Smoking status: Never  Smokeless tobacco: Never  Vaping Use   Vaping Use: Never used  Substance and Sexual Activity   Alcohol use: No   Drug use: No   Sexual activity: Yes    Partners: Female  Other Topics Concern   Not on file  Social History Narrative   Not on file   Social Determinants of Health   Financial Resource Strain: Not on file  Food Insecurity: Not on file  Transportation Needs: Not on file  Physical Activity: Not on file  Stress: Not on file  Social Connections: Not on file     Family History:  The patient's family history includes Heart attack in his paternal grandfather; Hyperlipidemia in his mother; Hypertension in his mother.   ROS General: Negative; No fevers, chills, or night sweats;  HEENT: Negative; No changes in vision or hearing, sinus congestion, difficulty swallowing Pulmonary: Negative; No cough, wheezing, shortness of breath, hemoptysis Cardiovascular: Chronic atrial fibrillation, hypertension GI: GERD GU: Negative; No dysuria, hematuria, or difficulty voiding Musculoskeletal: Negative; no myalgias, joint pain, or weakness Hematologic/Oncology: Negative; no easy bruising, bleeding Endocrine: Negative; no heat/cold intolerance; no diabetes Neuro: Negative; no changes in balance, headaches Skin: Negative; No rashes or skin lesions Psychiatric: Negative; No behavioral problems, depression Sleep: Severe obstructive sleep apnea; not yet on BiPAP; positive for snoring, daytime sleepiness, nobruxism,  restless legs, hypnogognic hallucinations, no cataplexy Other comprehensive 14 point system review is negative.   PHYSICAL EXAM:   VS:  BP (!) 155/91   Pulse (!) 116   Ht 6' 1"  (1.854 m)   Wt (!) 355 lb 6.4 oz (161.2 kg)   SpO2 97%   BMI 46.89 kg/m     Repeat blood pressure by me was 126/78.  Wt Readings from Last 3 Encounters:  03/08/21 (!) 355 lb 6.4 oz (161.2 kg)  11/15/20 (!) 359 lb (162.8 kg)  06/24/20 (!) 365 lb (165.6 kg)    General: Alert, oriented, no distress.  Skin: normal turgor, no rashes, warm and dry HEENT: Normocephalic, atraumatic. Pupils equal round and reactive to light; sclera anicteric; extraocular muscles intact;  Nose without nasal septal hypertrophy Mouth/Parynx benign; Mallinpatti scale 3/4 Neck: No JVD, no carotid bruits; normal carotid upstroke Lungs: clear to ausculatation and percussion; no wheezing or rales Chest wall: without tenderness to palpitation Heart: PMI not displaced, irregular irregular with a ventricular rate increased at 116; s1 s2 normal, 1/6 systolic murmur, no diastolic murmur, no rubs, gallops, thrills, or heaves Abdomen: soft, nontender; no hepatosplenomehaly, BS+; abdominal aorta nontender and not dilated by palpation. Back: no CVA tenderness Pulses 2+ Musculoskeletal: full range of motion, normal strength, no joint deformities Extremities: Trace lower extremity edema;  No clubbing cyanosis, Homan's sign negative  Neurologic: grossly nonfocal; Cranial nerves grossly wnl Psychologic: Normal mood and affect    Studies/Labs Reviewed:   EKG:  EKG is ordered today.  Atrial fibrillation at 116; LBBB  ECG (independently read by me): Atrial fibrillation at 123, LBBB  Recent Labs: BMP Latest Ref Rng & Units 06/24/2020 01/20/2020 07/23/2019  Glucose 65 - 99 mg/dL 96 125(H) 117(H)  BUN 6 - 24 mg/dL 21 21 22   Creatinine 0.76 - 1.27 mg/dL 1.40(H) 1.53(H) 1.65(H)  BUN/Creat Ratio 9 - 20 15 14 13   Sodium 134 - 144 mmol/L 141 145(H)  141  Potassium 3.5 - 5.2 mmol/L 4.3 4.4 4.6  Chloride 96 - 106 mmol/L 100 106 101  CO2 20 - 29 mmol/L 25 21 24   Calcium 8.7 - 10.2 mg/dL 9.7 9.3  9.5     Hepatic Function Latest Ref Rng & Units 11/20/2018 08/10/2018 11/23/2017  Total Protein 6.0 - 8.5 g/dL 6.6 6.7 -  Albumin 3.8 - 4.9 g/dL 3.9 3.6 3.2(L)  AST 0 - 40 IU/L 14 20 -  ALT 0 - 44 IU/L 9 14 -  Alk Phosphatase 39 - 117 IU/L 94 54 -  Total Bilirubin 0.0 - 1.2 mg/dL 0.2 1.0 -    CBC Latest Ref Rng & Units 06/24/2020 01/20/2020 06/26/2019  WBC 3.4 - 10.8 x10E3/uL 8.4 9.6 6.9  Hemoglobin 13.0 - 17.7 g/dL 14.7 11.8(L) 9.9(L)  Hematocrit 37.5 - 51.0 % 45.2 42.0 33.7(L)  Platelets 150 - 450 x10E3/uL 272 - 268   Lab Results  Component Value Date   MCV 88 06/24/2020   MCV 82 01/20/2020   MCV 77 (L) 06/26/2019   Lab Results  Component Value Date   TSH 4.111 11/22/2017   Lab Results  Component Value Date   HGBA1C 5.6 08/11/2018     BNP    Component Value Date/Time   BNP 144.2 (H) 02/24/2019 1552   BNP 248.5 (H) 11/19/2017 2010    ProBNP    Component Value Date/Time   PROBNP 3,686 (H) 07/23/2019 1544     Lipid Panel  No results found for: CHOL, TRIG, HDL, CHOLHDL, VLDL, LDLCALC, LDLDIRECT, LABVLDL   RADIOLOGY: No results found.   Additional studies/ records that were reviewed today include:   07/28/2018 CLINICAL INFORMATION Sleep Study Type: HST   Indication for sleep study: snoring, OSA   Epworth Sleepiness Score: 5   SLEEP STUDY TECHNIQUE A multi-channel overnight portable sleep study was performed. The channels recorded were: nasal airflow, thoracic respiratory movement, and oxygen saturation with a pulse oximetry. Snoring was also monitored.   MEDICATIONS     allopurinol (ZYLOPRIM) 300 MG tablet             carvedilol (COREG) 25 MG tablet         colchicine 0.6 MG tablet         furosemide (LASIX) 40 MG tablet         losartan (COZAAR) 50 MG tablet         rivaroxaban (XARELTO) 20 MG TABS  tablet       Patient self administered medications include: N/A.   SLEEP ARCHITECTURE Patient was studied for 357.3 minutes. The sleep efficiency was 100.0 % and the patient was supine for 69.5%. The arousal index was 0.0 per hour.   RESPIRATORY PARAMETERS The overall AHI was 53.2 per hour, with a central apnea index of 0.0 per hour.   The oxygen nadir was 60% during sleep.   CARDIAC DATA Mean heart rate during sleep was 82.6 bpm.   IMPRESSIONS - Severe obstructive sleep apnea occurred during this study (AHI = 53.2/h). - No significant central sleep apnea occurred during this study (CAI = 0.0/h). - Severe oxygen desaturation to a nadir of  60%. Time spent < 89% was 133.1 minutes. - Patient snored 34.3% during the sleep.   DIAGNOSIS - Obstructive Sleep Apnea (327.23 [G47.33 ICD-10]) - Nocturnal Hypoxemia (327.26 [G47.36 ICD-10])   RECOMMENDATIONS - In this patient with significant cardiovascular comorbidities including CHF,  atrial fibrillation and severe OSA with significant oxygen desaturation to 60%, recommend an in-lab CPAP titration study.  - Efforts should be made to optimize nasal and oropharyngeal patency. - Positional therapy avoiding supine position during sleep. - Avoid alcohol, sedatives and other CNS depressants that may worsen sleep apnea  and disrupt normal sleep architecture. - Sleep hygiene should be reviewed to assess factors that may improve sleep quality. - Weight management (BMI 50) and regular exercise should be initiated or continued  05/29/2019 CLINICAL INFORMATION The patient is referred for a BiPAP titration to treat sleep apnea.   Date of HST: 07/28/2018:  AHI 53.2/h; O2 saturation nadir 60% with 133 minutes < 89%.   SLEEP STUDY TECHNIQUE As per the AASM Manual for the Scoring of Sleep and Associated Events v2.3 (April 2016) with a hypopnea requiring 4% desaturations.   The channels recorded and monitored were frontal, central and occipital EEG,  electrooculogram (EOG), submentalis EMG (chin), nasal and oral airflow, thoracic and abdominal wall motion, anterior tibialis EMG, snore microphone, electrocardiogram, and pulse oximetry. Bilevel positive airway pressure (BPAP) was initiated at the beginning of the study and titrated to treat sleep-disordered breathing.   MEDICATIONS  allopurinol (ZYLOPRIM) 300 MG tablet     carvedilol (COREG) 25 MG tablet     COLCRYS 0.6 MG tablet     furosemide (LASIX) 40 MG tablet     losartan (COZAAR) 50 MG tablet     pantoprazole (PROTONIX) 40 MG tablet     rivaroxaban (XARELTO) 20 MG TABS tablet      Medications self-administered by patient taken the night of the study : N/A   RESPIRATORY PARAMETERS Optimal IPAP Pressure (cm): 26        AHI at Optimal Pressure (/hr) 0.0 Optimal EPAP Pressure (cm):            22           Overall Minimal O2 (%):         75.0     Minimal O2 at Optimal Pressure (%): 90.0   SLEEP ARCHITECTURE Start Time:      10:22:32 PM    Stop Time:       4:59:20 AM      Total Time (min):         396.8   Total Sleep Time (min):      388 Sleep Latency (min):   7.2       Sleep Efficiency (%):   97.8%  REM Latency (min):    48.0     WASO (min):  1.6 Stage N1 (%): 0.8%    Stage N2 (%): 50.4%  Stage N3 (%): 12.9%  Stage R (%):   36 Supine (%):     61.23   Arousal Index (/hr):     8.4          CARDIAC DATA The 2 lead EKG demonstrated sinus rhythm. The mean heart rate was 92.4 beats per minute. Other EKG findings include: Atrial Fibrillation, PVCs.   LEG MOVEMENT DATA The total Periodic Limb Movements of Sleep (PLMS) were 0. The PLMS index was 0.0. A PLMS index of <15 is considered normal in adults.   IMPRESSIONS - CPAP was initiated at 7 cm and titrated to 21 cm. Due to continued events BiPAP was initiated at 23/19 and was titrated to 26/22. AHI at 25/21 was 10.7/h and at 26/22  was 0 with O2 nadir at 90%. - Central sleep apnea was not noted during this titration (CAI = 0.0/h). -  Severe oxygen desaturations to a nadir of 75% at 15 cm CPAP pressure. - No snoring was audible during this study. - 2-lead EKG demonstrated: Atrial Fibrillation, PVCs - Clinically significant periodic limb movements were not noted during this study. Arousals associated with PLMs were rare.  DIAGNOSIS - Obstructive Sleep Apnea (327.23 [G47.33 ICD-10])   RECOMMENDATIONS - Recommend an initial trial of BiPAP therapy with EPR at 26/22 cm H2O with heated humidification. A Large size [employer]kel Full Face Mask Simplus mask was used for the titration. - Effort should be made to optimize nasal and oropharyngeal patency. - Avoid alcohol, sedatives and other CNS depressants that may worsen sleep apnea and disrupt normal sleep architecture. - Sleep hygiene should be reviewed to assess factors that may improve sleep quality. - Weight management and regular exercise should be initiated or continued. - Recommend a download in 30 days and sleep clinic evaluation after 4 weeks of therapy.  ASSESSMENT:    1. Obstructive sleep apnea syndrome   2. Atrial fibrillation with RVR ([place])   3. Nonischemic cardiomyopathy ([place])   4. Chronic anticoagulation   5. Obesity, Class III, BMI 40-49.9 (morbid obesity) ([place])   6. LBBB (left bundle branch block)   7. Morbid obesity [facility])    PLAN:   Joe Blake is a 55 year old gentleman who has a history of hypertension, reduced LV function with a preserved nonischemic cardiomyopathy and EF of 30 to 35%, who was diagnosed with severe obstructive sleep apnea.  He has a history of remote GI bleed.  Due to the COVID-19 pandemic, his initial CPAP/BiPAP titration study was delayed but he ultimately underwent a titration study in September 2020 and required maximum dose BiPAP therapy.  He ultimately was prescribed BiPAP therapy but for some reason this is never yet been instituted.  We try to initiate BiPAP auto therapy but this was denied as well as a reevaluation with a home  sleep study.  I did not understand why they would deny his repeat sleep evaluation as well as his initiation of BiPAP therapy.  We will be contacting insurance company again and try to again schedule him for either a split-night study or a home study with BiPAP auto therapy follow-up.  We discussed the importance of weight loss.  He has lost approximately 4 pounds since his last evaluation with me.  BMI continues to be elevated consistent with morbid obesity at 46.9.  His atrial fibrillation rate continues to be increased and I have further recommended titration of metoprolol succinate to 100 mg daily.  He is scheduled to see Dr. Percival Spanish for follow-up cardiology evaluation in September 2020.  Further medication adjustment with guideline directed medical therapy for his reduced LV function most likely will be implemented with consideration for changing losartan to Entresto and adding spironolactone.  I will see him in follow-up of his initiation of BiPAP therapy for follow-up sleep evaluation.    Patient continues to have issues with sleep.  He typically is in bed for approximately 9 hours per night.  With the severity of his sleep apnea, I will see if we can initiate BiPAP auto therapy with an EPAP minimum of 16 IPAP maximum of 25 and pressure support of 4.  However if insurance denies BiPAP auto therapy without another study we will schedule him for a home study and then BiPAP auto therapy subsequently.  I had a long discussion with him regarding the effects of untreated sleep apnea on his cardiovascular health.  In particular I discussed its effect on blood pressure control, nocturnal arrhythmias, potential contribution to his development of chronic atrial fibrillation, as well as potential for nocturnal ischemia in the setting of apnea mediated nocturnal hypoxia.  I also discussed this implications on glucose metabolism as well as GERD.  He has a history of persistent longstanding atrial fibrillation.  He  also has significant LV dysfunction.  He may ultimately be a candidate for Entresto depending upon insurance and ability to obtain the drug.  Presently he has only been taking his carvedilol 12.5 mg daily instead of 25 mg twice a day.  His atrial fibrillation rate is rapid today at 123.  I have recommended he discontinue his once a day carvedilol and in its place will initiate metoprolol succinate 50 mg.  He continues to be anticoagulated on Xarelto.  He is on furosemide 80 mg in the morning and 40 mg at night and losartan.  He may also benefit from subsequent initiation of spironolactone, but I will for this to his primary cardiologist.  We discussed the importance of weight loss and increased exercise.  BMI is consistent with morbid obesity.  I will see him in several months for follow-up evaluation or sooner as needed.  Medication Adjustments/Labs and Tests Ordered: Current medicines are reviewed at length with the patient today.  Concerns regarding medicines are outlined above.  Medication changes, Labs and Tests ordered today are listed in the Patient Instructions below. Patient Instructions  Medication Instructions:  Start Metoprolol 100 mg ( 1 Tablet Daily) *If you need a refill on your cardiac medications before your next appointment, please call your pharmacy*   Lab Work: No Labs If you have labs (blood work) drawn today and your tests are completely normal, you will receive your results only by: [place] (if you have MyChart) OR A paper copy in the mail If you have any lab test that is abnormal or we need to change your treatment, we will call you to review the results.   Testing/Procedures: No Testing   Follow-Up: At [facility], you and your health needs are our priority.  As part of our continuing mission to provide you with exceptional heart care, we have created designated Provider Care Teams.  These Care Teams include your primary Cardiologist (physician) and Advanced  Practice Providers (APPs -  Physician Assistants and Nurse Practitioners) who all work together to provide you with the care you need, when you need it.  We recommend signing up for the patient portal called "MyChart".  Sign up information is provided on this After Visit Summary.  MyChart is used to connect with patients for Virtual Visits (Telemedicine).  Patients are able to view lab/test results, encounter notes, upcoming appointments, etc.  Non-urgent messages can be sent to your provider as well.   To learn more about what you can do with MyChart, go to [email or web address].    Your next appointment:   4-5 month(s)  The format for your next appointment:   In Person  Provider:   Shelva Majestic, MD       Signed, Shelva Majestic, MD  03/11/2021 8:55 AM    [facility] [address], [place], [place], [place]  [address] Phone: [phone]

## 2021-03-08 NOTE — Patient Instructions (Signed)
Medication Instructions:  Start Metoprolol 100 mg ( 1 Tablet Daily) *If you need a refill on your cardiac medications before your next appointment, please call your pharmacy*   Lab Work: No Labs If you have labs (blood work) drawn today and your tests are completely normal, you will receive your results only by: [place] (if you have MyChart) OR A paper copy in the mail If you have any lab test that is abnormal or we need to change your treatment, we will call you to review the results.   Testing/Procedures: No Testing   Follow-Up: At [facility], you and your health needs are our priority.  As part of our continuing mission to provide you with exceptional heart care, we have created designated Provider Care Teams.  These Care Teams include your primary Cardiologist (physician) and Advanced Practice Providers (APPs -  Physician Assistants and Nurse Practitioners) who all work together to provide you with the care you need, when you need it.  We recommend signing up for the patient portal called "MyChart".  Sign up information is provided on this After Visit Summary.  MyChart is used to connect with patients for Virtual Visits (Telemedicine).  Patients are able to view lab/test results, encounter notes, upcoming appointments, etc.  Non-urgent messages can be sent to your provider as well.   To learn more about what you can do with MyChart, go to [email or web address].    Your next appointment:   4-5 month(s)  The format for your next appointment:   In Person  Provider:   Shelva Majestic, MD

## 2021-03-11 ENCOUNTER — Encounter: Payer: Self-pay | Admitting: Cardiovascular Disease

## 2021-04-11 ENCOUNTER — Ambulatory Visit: Payer: Medicaid Other | Attending: Family Medicine | Admitting: Family Medicine

## 2021-04-11 ENCOUNTER — Other Ambulatory Visit: Payer: Self-pay

## 2021-04-11 ENCOUNTER — Encounter: Payer: Self-pay | Admitting: Family Medicine

## 2021-04-11 VITALS — BP 124/82 | HR 72 | Ht 73.0 in | Wt 345.0 lb

## 2021-04-11 DIAGNOSIS — Z124 Encounter for screening for malignant neoplasm of cervix: Secondary | ICD-10-CM

## 2021-04-11 DIAGNOSIS — I4891 Unspecified atrial fibrillation: Secondary | ICD-10-CM | POA: Diagnosis not present

## 2021-04-11 DIAGNOSIS — Z1159 Encounter for screening for other viral diseases: Secondary | ICD-10-CM | POA: Diagnosis not present

## 2021-04-11 DIAGNOSIS — Z79899 Other long term (current) drug therapy: Secondary | ICD-10-CM | POA: Diagnosis not present

## 2021-04-11 DIAGNOSIS — Z23 Encounter for immunization: Secondary | ICD-10-CM | POA: Insufficient documentation

## 2021-04-11 DIAGNOSIS — R7303 Prediabetes: Secondary | ICD-10-CM

## 2021-04-11 DIAGNOSIS — Z7901 Long term (current) use of anticoagulants: Secondary | ICD-10-CM | POA: Diagnosis not present

## 2021-04-11 DIAGNOSIS — I5042 Chronic combined systolic (congestive) and diastolic (congestive) heart failure: Secondary | ICD-10-CM | POA: Diagnosis not present

## 2021-04-11 DIAGNOSIS — K219 Gastro-esophageal reflux disease without esophagitis: Secondary | ICD-10-CM | POA: Diagnosis not present

## 2021-04-11 DIAGNOSIS — M1A0790 Idiopathic chronic gout, unspecified ankle and foot, without tophus (tophi): Secondary | ICD-10-CM | POA: Insufficient documentation

## 2021-04-11 DIAGNOSIS — Z8249 Family history of ischemic heart disease and other diseases of the circulatory system: Secondary | ICD-10-CM | POA: Diagnosis not present

## 2021-04-11 DIAGNOSIS — I110 Hypertensive heart disease with heart failure: Secondary | ICD-10-CM | POA: Insufficient documentation

## 2021-04-11 DIAGNOSIS — G4733 Obstructive sleep apnea (adult) (pediatric): Secondary | ICD-10-CM | POA: Diagnosis not present

## 2021-04-11 MED ORDER — ALLOPURINOL 300 MG PO TABS
ORAL_TABLET | Freq: Every day | ORAL | 2 refills | Status: DC
  Filled 2021-04-11: qty 30, fill #0
  Filled 2021-05-06 (×2): qty 90, 90d supply, fill #0

## 2021-04-11 MED ORDER — ATORVASTATIN CALCIUM 20 MG PO TABS
20.0000 mg | ORAL_TABLET | Freq: Every day | ORAL | 3 refills | Status: DC
Start: 2021-04-11 — End: 2021-11-23
  Filled 2021-04-11: qty 30, 30d supply, fill #0
  Filled 2021-05-06: qty 90, 90d supply, fill #0
  Filled 2021-08-12: qty 30, 30d supply, fill #1

## 2021-04-11 MED ORDER — PANTOPRAZOLE SODIUM 40 MG PO TBEC
DELAYED_RELEASE_TABLET | Freq: Every day | ORAL | 0 refills | Status: DC
  Filled 2021-04-11: qty 90, fill #0
  Filled 2021-05-06: qty 90, 90d supply, fill #0

## 2021-04-11 NOTE — Patient Instructions (Signed)
Low-Purine Eating Plan A low-purine eating plan involves making food choices to limit your intake of purine. Purine is a kind of uric acid. Too much uric acid in your blood can cause certain conditions, such as gout and kidney stones. Eating a low-purinediet can help control these conditions. What are tips for following this plan? Reading food labels Avoid foods with saturated or Trans fat. Check the ingredient list of grains-based foods, such as bread and cereal, to make sure that they contain whole grains. Check the ingredient list of sauces or soups to make sure they do not contain meat or fish. When choosing soft drinks, check the ingredient list to make sure they do not contain high-fructose corn syrup. Shopping  Buy plenty of fresh fruits and vegetables. Avoid buying canned or fresh fish. Buy dairy products labeled as low-fat or nonfat. Avoid buying premade or processed foods. These foods are often high in fat, salt (sodium), and added sugar.  Cooking Use olive oil instead of butter when cooking. Oils like olive oil, canola oil, and sunflower oil contain healthy fats. Meal planning Learn which foods do or do not affect you. If you find out that a food tends to cause your gout symptoms to flare up, avoid eating that food. You can enjoy foods that do not cause problems. If you have any questions about a food item, talk with your dietitian or health care provider. Limit foods high in fat, especially saturated fat. Fat makes it harder for your body to get rid of uric acid. Choose foods that are lower in fat and are lean sources of protein. General guidelines Limit alcohol intake to no more than 1 drink a day for nonpregnant women and 2 drinks a day for men. One drink equals 12 oz of beer, 5 oz of wine, or 1 oz of hard liquor. Alcohol can affect the way your body gets rid of uric acid. Drink plenty of water to keep your urine clear or pale yellow. Fluids can help remove uric acid from your  body. If directed by your health care provider, take a vitamin C supplement. Work with your health care provider and dietitian to develop a plan to achieve or maintain a healthy weight. Losing weight can help reduce uric acid in your blood. What foods are recommended? The items listed may not be a complete list. Talk with your dietitian aboutwhat dietary choices are best for you. Foods low in purines Foods low in purines do not need to be limited. These include: All fruits. All low-purine vegetables, pickles, and olives. Breads, pasta, rice, cornbread, and popcorn. Cake and other baked goods. All dairy foods. Eggs, nuts, and nut butters. Spices and condiments, such as salt, herbs, and vinegar. Plant oils, butter, and margarine. Water, sugar-free soft drinks, tea, coffee, and cocoa. Vegetable-based soups, broths, sauces, and gravies. Foods moderate in purines Foods moderate in purines should be limited to the amounts listed.  cup of asparagus, cauliflower, spinach, mushrooms, or green peas, each day. 2/3 cup uncooked oatmeal, each day.  cup dry wheat bran or wheat germ, each day. 2-3 ounces of meat or poultry, each day. 4-6 ounces of shellfish, such as crab, lobster, oysters, or shrimp, each day. 1 cup cooked beans, peas, or lentils, each day. Soup, broths, or bouillon made from meat or fish. Limit these foods as much as possible. What foods are not recommended? The items listed may not be a complete list. Talk with your dietitian aboutwhat dietary choices are best for you.   Limit your intake of foods high in purines, including: Beer and other alcohol. Meat-based gravy or sauce. Canned or fresh fish, such as: Anchovies, sardines, herring, and tuna. Mussels and scallops. Codfish, trout, and haddock. Bacon. Organ meats, such as: Liver or kidney. Tripe. Sweetbreads (thymus gland or pancreas). Wild game or goose. Yeast or yeast extract supplements. Drinks sweetened with  high-fructose corn syrup. Summary Eating a low-purine diet can help control conditions caused by too much uric acid in the body, such as gout or kidney stones. Choose low-purine foods, limit alcohol, and limit foods high in fat. You will learn over time which foods do or do not affect you. If you find out that a food tends to cause your gout symptoms to flare up, avoid eating that food. This information is not intended to replace advice given to you by your health care provider. Make sure you discuss any questions you have with your healthcare provider. Document Revised: 12/04/2019 Document Reviewed: 12/04/2019 Elsevier Patient Education  2022 Elsevier Inc.  

## 2021-04-11 NOTE — Progress Notes (Signed)
May have gout flare in both elbows.  Needs medication refills.

## 2021-04-11 NOTE — Progress Notes (Signed)
Subjective:  Patient ID: Joe Blake, male    DOB: 1966-03-09  Age: 55 y.o. MRN: [id number]  CC: Gout   HPI [name] is a 55 year old male with history of Prediabetes (A1c 5.6) hypertension, Gout, congestive heart failure (EF 30-35% from 05/2018), atrial fibrillation with RVR/atrial flutter status post DCCV on 03/2018 (currently on anticoagulation with Xarelto), obstructive sleep apnea here for follow-up visit.  Interval History: He runs out air when he walks a long distance but has no chest pain or palpitations; pedal edema is controlled on Furosemide.  He had a recent visit with his cardiologist Dr. Claiborne Billings last month.  Carvedilol was substituted with metoprolol due to tachycardia and hypertension.  Complains he might have be having a Gout flare is his elbows as symptoms started last night and he took some Aleve which did help.  He has been compliant with his allopurinol. He complains of low energy and seeping a lot - he would like his Testosterone level checked as a family member was recently diagnosed with low testosterone. He would like a Tdap today.  Past Medical History:  Diagnosis Date   Acute systolic HF (heart failure) (HCC)    Atrial fibrillation (HCC)    Diabetes mellitus without complication ([place])    Hiatal hernia    History of esophageal ulcer    History of gastric ulcer    Hypertension    Morbid obesity ([place])     Past Surgical History:  Procedure Laterality Date   ABDOMINAL SURGERY     APPENDECTOMY     at 55 years old   BIOPSY  08/11/2018   Procedure: BIOPSY;  Surgeon: Laurence Spates, MD;  Location: [facility];  Service: Endoscopy;;   CARDIOVERSION N/A 03/18/2018   Procedure: CARDIOVERSION;  Surgeon: Skeet Latch, MD;  Location: [facility];  Service: Cardiovascular;  Laterality: N/A;   ESOPHAGOGASTRODUODENOSCOPY (EGD) WITH PROPOFOL N/A 08/11/2018   Procedure: ESOPHAGOGASTRODUODENOSCOPY (EGD) WITH PROPOFOL;  Surgeon: Laurence Spates, MD;  Location: [facility];  Service: Endoscopy;  Laterality: N/A;    Family History  Problem Relation Age of Onset   Heart attack Paternal Grandfather    Hyperlipidemia Mother    Hypertension Mother     No Known Allergies  Outpatient Medications Prior to Visit  Medication Sig Dispense Refill   COLCRYS 0.6 MG tablet TAKE 1 TABLET (0.6 MG TOTAL) BY MOUTH ONCE AS NEEDED (TAKE 1.2MG AND THEN 0.6MG 1 HOUR LATER IF STILL HAVING SYMPTOMS). 30 tablet 2   furosemide (LASIX) 80 MG tablet TAKE 1 TABLETS BY MOUTH IN THE MORNING AND 1/2 TABLET IN THE EVENING 135 tablet 3   losartan (COZAAR) 50 MG tablet TAKE 1 TABLET (50 MG TOTAL) BY MOUTH DAILY. 90 tablet 3   metoprolol succinate (TOPROL-XL) 100 MG 24 hr tablet Take 1 tablet (100 mg total) by mouth daily. Take with or immediately following a meal. 90 tablet 3   rivaroxaban (XARELTO) 20 MG TABS tablet TAKE 1 TABLET (20 MG TOTAL) BY MOUTH DAILY WITH SUPPER. 90 tablet 3   allopurinol (ZYLOPRIM) 300 MG tablet TAKE 1 TABLET (300 MG TOTAL) BY MOUTH DAILY. 30 tablet 2   pantoprazole (PROTONIX) 40 MG tablet TAKE 1 TABLET (40 MG TOTAL) BY MOUTH DAILY. 90 tablet 0   No facility-administered medications prior to visit.     ROS Review of Systems  Constitutional:  Positive for fatigue. Negative for activity change and appetite change.  HENT:  Negative for sinus pressure and sore throat.   Eyes:  Negative for visual disturbance.  Respiratory:  Positive for shortness of breath. Negative for cough and chest tightness.   Cardiovascular:  Negative for chest pain and leg swelling.  Gastrointestinal:  Negative for abdominal distention, abdominal pain, constipation and diarrhea.  Endocrine: Negative.   Genitourinary:  Negative for dysuria.  Musculoskeletal:  Negative for joint swelling and myalgias.  Skin:  Negative for rash.  Allergic/Immunologic: Negative.   Neurological:  Negative for weakness, light-headedness and numbness.  Psychiatric/Behavioral:  Negative for  dysphoric mood and suicidal ideas.    Objective:  BP 124/82   Pulse 72   Ht 6' 1"  (1.854 m)   Wt (!) 345 lb (156.5 kg)   SpO2 95%   BMI 45.52 kg/m   BP/Weight 04/11/2021 03/08/2021 6/37/8588  Systolic BP 502 774 128  Diastolic BP 82 91 92  Wt. (Lbs) 345 355.4 359  BMI 45.52 46.89 47.36      Physical Exam Constitutional:      Appearance: He is well-developed. He is obese.  Cardiovascular:     Rate and Rhythm: Normal rate.     Heart sounds: Normal heart sounds. No murmur heard. Pulmonary:     Effort: Pulmonary effort is normal.     Breath sounds: Normal breath sounds. No wheezing or rales.  Chest:     Chest wall: No tenderness.  Abdominal:     General: Bowel sounds are normal. There is no distension.     Palpations: Abdomen is soft. There is no mass.     Tenderness: There is no abdominal tenderness.  Musculoskeletal:        General: Normal range of motion.     Right lower leg: No edema.     Left lower leg: No edema.  Neurological:     Mental Status: He is alert and oriented to person, place, and time.  Psychiatric:        Mood and Affect: Mood normal.    CMP Latest Ref Rng & Units 06/24/2020 01/20/2020 07/23/2019  Glucose 65 - 99 mg/dL 96 125(H) 117(H)  BUN 6 - 24 mg/dL 21 21 22   Creatinine 0.76 - 1.27 mg/dL 1.40(H) 1.53(H) 1.65(H)  Sodium 134 - 144 mmol/L 141 145(H) 141  Potassium 3.5 - 5.2 mmol/L 4.3 4.4 4.6  Chloride 96 - 106 mmol/L 100 106 101  CO2 20 - 29 mmol/L 25 21 24   Calcium 8.7 - 10.2 mg/dL 9.7 9.3 9.5  Total Protein 6.0 - 8.5 g/dL - - -  Total Bilirubin 0.0 - 1.2 mg/dL - - -  Alkaline Phos 39 - 117 IU/L - - -  AST 0 - 40 IU/L - - -  ALT 0 - 44 IU/L - - -    Lipid Panel  No results found for: CHOL, TRIG, HDL, CHOLHDL, VLDL, LDLCALC, LDLDIRECT  CBC    Component Value Date/Time   WBC 8.4 06/24/2020 1428   WBC 7.6 08/15/2018 0620   RBC 5.15 06/24/2020 1428   RBC 2.64 (L) 08/15/2018 0620   HGB 14.7 06/24/2020 1428   HCT 45.2 06/24/2020 1428    PLT 272 06/24/2020 1428   MCV 88 06/24/2020 1428   MCH 28.5 06/24/2020 1428   MCH 29.9 08/15/2018 0620   MCHC 32.5 06/24/2020 1428   MCHC 30.6 08/15/2018 0620   RDW 15.4 06/24/2020 1428   LYMPHSABS 1.4 01/20/2020 1045   MONOABS 0.9 08/10/2018 1453   EOSABS 0.2 01/20/2020 1045   BASOSABS 0.1 01/20/2020 1045    Lab Results  Component Value  Date   HGBA1C 5.6 08/11/2018    Assessment & Plan:  1. Idiopathic chronic gout of foot without tophus, unspecified laterality He is starting to have an acute flare Advised to take colchicine along with his NSAID - Uric Acid - allopurinol (ZYLOPRIM) 300 MG tablet; TAKE 1 TABLET (300 MG TOTAL) BY MOUTH DAILY.  Dispense: 30 tablet; Refill: 2  2. Gastroesophageal reflux disease without esophagitis Controlled - pantoprazole (PROTONIX) 40 MG tablet; TAKE 1 TABLET (40 MG TOTAL) BY MOUTH DAILY.  Dispense: 90 tablet; Refill: 0 - CMP14+EGFR  3. Prediabetes Diet controlled with A1c of 5.6 - Hemoglobin A1c  4. Screening for hepatitis C - HCV RNA quant rflx ultra or genotyp([facility])  5. 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 Atrial fibrillation with RVR (HCC) Rate controlled on metoprolol Continue anticoagulation with Xarelto  7. Need for Tdap vaccination - Tdap vaccine greater than or equal to 7yo IM   Meds ordered this encounter  Medications   allopurinol (ZYLOPRIM) 300 MG tablet    Sig: TAKE 1 TABLET (300 MG TOTAL) BY MOUTH DAILY.    Dispense:  30 tablet    Refill:  2   pantoprazole (PROTONIX) 40 MG tablet    Sig: TAKE 1 TABLET (40 MG TOTAL) BY MOUTH DAILY.    Dispense:  90 tablet    Refill:  0   atorvastatin (LIPITOR) 20 MG tablet    Sig: Take 1 tablet (20 mg total) by mouth daily.    Dispense:  30 tablet    Refill:  3     Follow-up: Return in about 6 months (around 10/12/2021) for  Chronic medical conditions.       Charlott Rakes, MD, FAAFP. [facility] [place], [place]   04/11/2021, 3:03 PM

## 2021-04-12 ENCOUNTER — Other Ambulatory Visit: Payer: Self-pay | Admitting: Family Medicine

## 2021-04-12 MED ORDER — DAPAGLIFLOZIN PROPANEDIOL 5 MG PO TABS
5.0000 mg | ORAL_TABLET | Freq: Every day | ORAL | 6 refills | Status: DC
  Filled 2021-04-12 – 2021-05-06 (×2): qty 30, 30d supply, fill #0
  Filled 2021-05-06: qty 90, 90d supply, fill #0
  Filled 2021-06-15: qty 90, 90d supply, fill #1
  Filled 2021-08-12: qty 90, 90d supply, fill #2
  Filled 2021-11-17: qty 90, 90d supply, fill #0

## 2021-04-13 ENCOUNTER — Other Ambulatory Visit: Payer: Self-pay

## 2021-04-13 LAB — CMP14+EGFR
ALT: 12 IU/L (ref 0–44)
AST: 18 IU/L (ref 0–40)
Albumin/Globulin Ratio: 1.2 (ref 1.2–2.2)
Albumin: 4.3 g/dL (ref 3.8–4.9)
Alkaline Phosphatase: 167 IU/L — ABNORMAL HIGH (ref 44–121)
BUN/Creatinine Ratio: 17 (ref 9–20)
BUN: 26 mg/dL — ABNORMAL HIGH (ref 6–24)
Bilirubin Total: 1.2 mg/dL (ref 0.0–1.2)
CO2: 25 mmol/L (ref 20–29)
Calcium: 10 mg/dL (ref 8.7–10.2)
Chloride: 97 mmol/L (ref 96–106)
Creatinine, Ser: 1.5 mg/dL — ABNORMAL HIGH (ref 0.76–1.27)
Globulin, Total: 3.6 g/dL (ref 1.5–4.5)
Glucose: 140 mg/dL — ABNORMAL HIGH (ref 65–99)
Potassium: 4.1 mmol/L (ref 3.5–5.2)
Sodium: 139 mmol/L (ref 134–144)
Total Protein: 7.9 g/dL (ref 6.0–8.5)
eGFR: 55 mL/min/{1.73_m2} — ABNORMAL LOW (ref >59)

## 2021-04-13 LAB — HCV RNA QUANT RFLX ULTRA OR GENOTYP: HCV Quant Baseline: NOT DETECTED IU/mL

## 2021-04-13 LAB — HEMOGLOBIN A1C
Est. average glucose Bld gHb Est-mCnc: 140 mg/dL
Hgb A1c MFr Bld: 6.5 % — ABNORMAL HIGH (ref 4.8–5.6)

## 2021-04-13 LAB — URIC ACID: Uric Acid: 7.2 mg/dL (ref 3.8–8.4)

## 2021-04-18 ENCOUNTER — Other Ambulatory Visit: Payer: Self-pay

## 2021-04-20 ENCOUNTER — Other Ambulatory Visit: Payer: Self-pay

## 2021-05-06 ENCOUNTER — Other Ambulatory Visit: Payer: Self-pay

## 2021-05-06 MED FILL — Losartan Potassium Tab 50 MG: ORAL | 90 days supply | Qty: 90 | Fill #1 | Status: AC

## 2021-05-06 MED FILL — Rivaroxaban Tab 20 MG: ORAL | 90 days supply | Qty: 90 | Fill #1 | Status: AC

## 2021-05-06 MED FILL — Furosemide Tab 80 MG: ORAL | 90 days supply | Qty: 135 | Fill #1 | Status: AC

## 2021-05-10 ENCOUNTER — Other Ambulatory Visit: Payer: Self-pay

## 2021-05-19 ENCOUNTER — Ambulatory Visit: Payer: Medicaid Other | Admitting: Cardiology

## 2021-05-22 NOTE — Progress Notes (Signed)
Cardiology Office Note   Date:  05/24/2021   ID:  Joe Blake, Joe Blake 1965-09-05, MRN [id number]  PCP:  Joe Rakes, MD  Cardiologist:   Minus Breeding, MD   Chief Complaint  Patient presents with   Cardiomyopathy       History of Present Illness: Joe Blake is a 55 y.o. male who presents for follow up of atrial fib with RVR.  He was admitted in March and had an EF of 20 - 25%.  He had been admitted at [facility] in [place] in 2013 and was told his heart was "pumping at 33%".  There was no history of cath, or MI and he is a poor candidate for Myoview secondary to morbid obesity .   His last catheterization with [place] was 30 to 35% in 2019.    Since he was last seen he said he had no acute complaints other than some very rare episodes of dizziness.  He says that some days he breathes well and some days a little short of breath.  He does not correlate which he says he does not use.  He rarely eats at [employer].  Otherwise he cooks for himself.  He denies any consistent shortness of breath, PND or orthopnea.  He does not notice any palpitations, presyncope or syncope.  He is actually had about 40 pounds of weight loss by stopping sugar drinks!   Past Medical History:  Diagnosis Date   Acute systolic HF (heart failure) (HCC)    Atrial fibrillation (HCC)    Diabetes mellitus without complication ([place])    Hiatal hernia    History of esophageal ulcer    History of gastric ulcer    Hypertension    Morbid obesity ([place])     Past Surgical History:  Procedure Laterality Date   ABDOMINAL SURGERY     APPENDECTOMY     at 55 years old   BIOPSY  08/11/2018   Procedure: BIOPSY;  Surgeon: Laurence Spates, MD;  Location: [facility];  Service: Endoscopy;;   CARDIOVERSION N/A 03/18/2018   Procedure: CARDIOVERSION;  Surgeon: Skeet Latch, MD;  Location: [facility];  Service: Cardiovascular;  Laterality: N/A;   ESOPHAGOGASTRODUODENOSCOPY (EGD) WITH PROPOFOL N/A 08/11/2018    Procedure: ESOPHAGOGASTRODUODENOSCOPY (EGD) WITH PROPOFOL;  Surgeon: Laurence Spates, MD;  Location: [facility];  Service: Endoscopy;  Laterality: N/A;     Current Outpatient Medications  Medication Sig Dispense Refill   allopurinol (ZYLOPRIM) 300 MG tablet TAKE 1 TABLET (300 MG TOTAL) BY MOUTH DAILY. 30 tablet 2   atorvastatin (LIPITOR) 20 MG tablet Take 1 tablet (20 mg total) by mouth daily. 30 tablet 3   COLCRYS 0.6 MG tablet TAKE 1 TABLET (0.6 MG TOTAL) BY MOUTH ONCE AS NEEDED (TAKE 1.'2MG'$  AND THEN 0.'6MG'$  1 HOUR LATER IF STILL HAVING SYMPTOMS). 30 tablet 2   dapagliflozin propanediol (FARXIGA) 5 MG TABS tablet Take 1 tablet (5 mg total) by mouth daily before breakfast. 30 tablet 6   furosemide (LASIX) 80 MG tablet TAKE 1 TABLETS BY MOUTH IN THE MORNING AND 1/2 TABLET IN THE EVENING 135 tablet 3   metoprolol succinate (TOPROL-XL) 100 MG 24 hr tablet Take 1 tablet (100 mg total) by mouth daily. Take with or immediately following a meal. 90 tablet 3   pantoprazole (PROTONIX) 40 MG tablet TAKE 1 TABLET (40 MG TOTAL) BY MOUTH DAILY. 90 tablet 0   rivaroxaban (XARELTO) 20 MG TABS tablet TAKE 1 TABLET (20 MG TOTAL)  BY MOUTH DAILY WITH SUPPER. 90 tablet 3   sacubitril-valsartan (ENTRESTO) 24-26 MG Take 1 tablet by mouth 2 (two) times daily. 60 tablet 0   No current facility-administered medications for this visit.    Allergies:   Patient has no known allergies.    ROS:  Please see the history of present illness.   Otherwise, review of systems are positive for none.   All other systems are reviewed and negative.    PHYSICAL EXAM: VS:  BP (!) 162/92   Pulse 97   Ht '6\' 1"'$  (1.854 m)   Wt (!) 343 lb 12.8 oz (155.9 kg)   SpO2 97%   BMI 45.36 kg/m  , BMI Body mass index is 45.36 kg/m. GENERAL:  Well appearing NECK:  No jugular venous distention, waveform within normal limits, carotid upstroke brisk and symmetric, no bruits, no thyromegaly LUNGS:  Clear to auscultation bilaterally CHEST:   Unremarkable HEART:  PMI not displaced or sustained,S1 and S2 within normal limits, no S3, no clicks, no rubs, no murmurs, irregular ABD:  Flat, positive bowel sounds normal in frequency in pitch, no bruits, no rebound, no guarding, no midline pulsatile mass, no hepatomegaly, no splenomegaly EXT:  2 plus pulses throughout, no edema, no cyanosis no clubbing   EKG:  EKG is ordered today. The ekg ordered today demonstrates atrial fibrillation, rate 97, left bundle branch block, left axis deviation   Recent Labs: 06/24/2020: Hemoglobin 14.7; Platelets 272 04/11/2021: ALT 12; BUN 26; Creatinine, Ser 1.50; Potassium 4.1; Sodium 139    Lipid Panel No results found for: CHOL, TRIG, HDL, CHOLHDL, VLDL, LDLCALC, LDLDIRECT    Wt Readings from Last 3 Encounters:  05/24/21 (!) 343 lb 12.8 oz (155.9 kg)  04/11/21 (!) 345 lb (156.5 kg)  03/08/21 (!) 355 lb 6.4 oz (161.2 kg)      Other studies Reviewed: Additional studies/ records that were reviewed today include: Labs. Review of the above records demonstrates:  Please see elsewhere in the note.     ASSESSMENT AND PLAN:  Persistent atrial fibrillation [facility]) [name] has a CHA2DS2 - VASc score of 3.  He tolerates anticoagulation.  I will check a CBC when he comes back for blood work.  HTN (hypertension) This is being managed in the context of treating his CHF  CRI-3- Creat was 1.5 in August.  I will assess this again in 10 days as below.   Obesity I am very proud of his weight loss.  I encouraged more of the same.    Sleep Apnea He has been seen by Dr. Claiborne Billings but unfortunately there is a waiting period for CPAP.  I notified them today and he is on the list.   Chronic Systolic HF He came to [place] with a diagnosis of nonischemic cardiomyopathy few years ago and he has had class I-II symptoms.  I am going to titrate his meds by stopping Cozaar and starting Entresto 24-26.  He will come back in 1 month for med titration and he can get a  BNP in 10 days.    Current medicines are reviewed at length with the patient today.  The patient does not have concerns regarding medicines.  The following changes have been made: As above  Labs/ tests ordered today include:   Orders Placed This Encounter  Procedures   Basic metabolic panel   CBC   EKG 12-Lead      Disposition:   FU with APP in one month.  Signed, Minus Breeding, MD  05/24/2021 2:17 PM    [facility]

## 2021-05-24 ENCOUNTER — Encounter: Payer: Self-pay | Admitting: Cardiology

## 2021-05-24 ENCOUNTER — Other Ambulatory Visit: Payer: Self-pay

## 2021-05-24 ENCOUNTER — Ambulatory Visit: Payer: Medicaid Other | Admitting: Cardiology

## 2021-05-24 VITALS — BP 162/92 | HR 97 | Ht 73.0 in | Wt 343.8 lb

## 2021-05-24 DIAGNOSIS — I10 Essential (primary) hypertension: Secondary | ICD-10-CM

## 2021-05-24 DIAGNOSIS — I4819 Other persistent atrial fibrillation: Secondary | ICD-10-CM | POA: Diagnosis not present

## 2021-05-24 DIAGNOSIS — I5022 Chronic systolic (congestive) heart failure: Secondary | ICD-10-CM

## 2021-05-24 DIAGNOSIS — N1831 Chronic kidney disease, stage 3a: Secondary | ICD-10-CM | POA: Diagnosis not present

## 2021-05-24 MED ORDER — ENTRESTO 24-26 MG PO TABS: 1.0000 | ORAL_TABLET | Freq: Two times a day (BID) | ORAL | 0 refills | Status: DC

## 2021-05-24 MED ORDER — ENTRESTO 24-26 MG PO TABS
1.0000 | ORAL_TABLET | Freq: Two times a day (BID) | ORAL | 0 refills | Status: DC
  Filled 2021-05-24: qty 60, 30d supply, fill #0

## 2021-05-24 NOTE — Patient Instructions (Addendum)
Medication Instructions:  STOP Losartan START Entresto 24/26 mg two times daily  *If you need a refill on your cardiac medications before your next appointment, please call your pharmacy*   Lab Work: Please return for labs in 10 days (BMET, CBC)  Our in office lab hours are Monday-Friday 8:00-4:00, closed for lunch 12:45-1:45 pm.  No appointment needed.  Follow-Up: At [facility], you and your health needs are our priority.  As part of our continuing mission to provide you with exceptional heart care, we have created designated Provider Care Teams.  These Care Teams include your primary Cardiologist (physician) and Advanced Practice Providers (APPs -  Physician Assistants and Nurse Practitioners) who all work together to provide you with the care you need, when you need it.  We recommend signing up for the patient portal called "MyChart".  Sign up information is provided on this After Visit Summary.  MyChart is used to connect with patients for Virtual Visits (Telemedicine).  Patients are able to view lab/test results, encounter notes, upcoming appointments, etc.  Non-urgent messages can be sent to your provider as well.   To learn more about what you can do with MyChart, go to [email or web address].    Your next appointment:   1 month(s)  The format for your next appointment:   In Person  Provider:   You will see one of the following Advanced Practice Providers on your designated Care Team:   Rosaria Ferries, PA-C Caron Presume, PA-C Jory Sims, DNP, ANP

## 2021-06-01 ENCOUNTER — Other Ambulatory Visit: Payer: Self-pay

## 2021-06-03 ENCOUNTER — Telehealth: Payer: Self-pay | Admitting: *Deleted

## 2021-06-03 NOTE — Telephone Encounter (Signed)
PA for entresto started via cover my meds

## 2021-06-06 NOTE — Telephone Encounter (Signed)
Joe Blake has been approved with a quantity limit of 60 through 06/03/22.

## 2021-06-15 ENCOUNTER — Other Ambulatory Visit: Payer: Self-pay

## 2021-06-16 ENCOUNTER — Other Ambulatory Visit: Payer: Self-pay

## 2021-06-23 NOTE — Progress Notes (Signed)
Cardiology Office Note   Date:  06/24/2021   ID:  CLEMON DEVAUL, DOB 18-Jul-1966, MRN [id number]  PCP:  Charlott Rakes, MD  Cardiologist: Dr. Percival Spanish CC: Follow Up CHF    History of Present Illness: Joe Blake is a 55 y.o. male who presents for ongoing assessment and management of atrial fibrillation with history of A. fib with RVR with admission to the hospital in March 2022.  Echocardiogram revealed an EF of 20 to 25%.    The patient was last seen by Dr. Percival Spanish on 05/24/2021 at which time he had no complaints other than some very rare episodes of dizziness.  He had actually lost about 40 pounds after stopping drinking sugary drinks.  Other history includes diabetes mellitus, hypertension, hiatal hernia, and morbid obesity.  Weight on last office visit 343 pounds.  On last office visit the patient's heart rate was well controlled and he was able to tolerate anticoagulation. CHADS VASC Score 3.  Blood pressure was controlled in the setting of reduced EF.  He had been referred to Dr. Claiborne Billings and was planned for CPAP.  Apparently there is a waiting list to have these placed.  Dr. Percival Spanish also stopped Cozaar and started Entresto 24/26 mg daily, he was to have a follow-up BNP in 10 days along with a CBC and BMET and be seen today.  He comes today without any complaints.  He has been taking the [facility] as directed.  He did not have his lab work drawn.  This will be completed before he leaves the office today.  He admits to dietary noncompliance with cold cuts and snack foods at home.  He has gained about 4 pounds since being seen last.  He denies any significant dyspnea on exertion or lower extremity edema.  Blood pressure is better controlled today.  He has been approved for [employer].  He states that there are days when he takes the furosemide and he has great results and other days when he takes it and he has very little results.  Past Medical History:  Diagnosis Date   Acute systolic HF (heart  failure) (HCC)    Atrial fibrillation (HCC)    Diabetes mellitus without complication ([place])    Hiatal hernia    History of esophageal ulcer    History of gastric ulcer    Hypertension    Morbid obesity ([place])     Past Surgical History:  Procedure Laterality Date   ABDOMINAL SURGERY     APPENDECTOMY     at 55 years old   BIOPSY  08/11/2018   Procedure: BIOPSY;  Surgeon: Laurence Spates, MD;  Location: [facility];  Service: Endoscopy;;   CARDIOVERSION N/A 03/18/2018   Procedure: CARDIOVERSION;  Surgeon: Skeet Latch, MD;  Location: [facility];  Service: Cardiovascular;  Laterality: N/A;   ESOPHAGOGASTRODUODENOSCOPY (EGD) WITH PROPOFOL N/A 08/11/2018   Procedure: ESOPHAGOGASTRODUODENOSCOPY (EGD) WITH PROPOFOL;  Surgeon: Laurence Spates, MD;  Location: [facility];  Service: Endoscopy;  Laterality: N/A;     Current Outpatient Medications  Medication Sig Dispense Refill   allopurinol (ZYLOPRIM) 300 MG tablet TAKE 1 TABLET (300 MG TOTAL) BY MOUTH DAILY. 30 tablet 2   atorvastatin (LIPITOR) 20 MG tablet Take 1 tablet (20 mg total) by mouth daily. 30 tablet 3   COLCRYS 0.6 MG tablet TAKE 1 TABLET (0.6 MG TOTAL) BY MOUTH ONCE AS NEEDED (TAKE 1.2MG  AND THEN 0.6MG  1 HOUR LATER IF STILL HAVING SYMPTOMS). 30 tablet 2   dapagliflozin propanediol (FARXIGA) 5  MG TABS tablet Take 1 tablet (5 mg total) by mouth daily before breakfast. 30 tablet 6   furosemide (LASIX) 80 MG tablet TAKE 1 TABLETS BY MOUTH IN THE MORNING AND 1/2 TABLET IN THE EVENING 135 tablet 3   metoprolol succinate (TOPROL-XL) 100 MG 24 hr tablet Take 1 tablet (100 mg total) by mouth daily. Take with or immediately following a meal. 90 tablet 3   pantoprazole (PROTONIX) 40 MG tablet TAKE 1 TABLET (40 MG TOTAL) BY MOUTH DAILY. 90 tablet 0   rivaroxaban (XARELTO) 20 MG TABS tablet TAKE 1 TABLET (20 MG TOTAL) BY MOUTH DAILY WITH SUPPER. 90 tablet 3   sacubitril-valsartan (ENTRESTO) 24-26 MG Take 1 tablet by mouth 2 (two) times  daily. 60 tablet 0   No current facility-administered medications for this visit.    Allergies:   Patient has no known allergies.    Social History:  The patient  reports that he has never smoked. He has never used smokeless tobacco. He reports that he does not drink alcohol and does not use drugs.   Family History:  The patient's family history includes Heart attack in his paternal grandfather; Hyperlipidemia in his mother; Hypertension in his mother.    ROS: All other systems are reviewed and negative. Unless otherwise mentioned in H&P    PHYSICAL EXAM: VS:  BP 136/74   Pulse 70   Ht 6\' 1"  (1.854 m)   Wt (!) 347 lb 6.4 oz (157.6 kg)   SpO2 94%   BMI 45.83 kg/m  , BMI Body mass index is 45.83 kg/m. GEN: Well nourished, well developed, in no acute distress, morbidly obese HEENT: normal Neck: no JVD, carotid bruits, or masses Cardiac: IRRR;, distant heart sounds, no murmurs, rubs, or gallops, 1+ to 2+ dependent edema Respiratory:  Clear to auscultation bilaterally, normal work of breathing GI: soft, nontender, nondistended, + BS MS: no deformity or atrophy Skin: warm and dry, no rash Neuro:  Strength and sensation are intact Psych: euthymic mood, full affect   EKG:  EKG is not ordered today.    Recent Labs: 04/11/2021: ALT 12; BUN 26; Creatinine, Ser 1.50; Potassium 4.1; Sodium 139    Lipid Panel No results found for: CHOL, TRIG, HDL, CHOLHDL, VLDL, LDLCALC, LDLDIRECT    Wt Readings from Last 3 Encounters:  06/24/21 (!) 347 lb 6.4 oz (157.6 kg)  05/24/21 (!) 343 lb 12.8 oz (155.9 kg)  04/11/21 (!) 345 lb (156.5 kg)      Other studies Reviewed: Echocardiogram Jun 28, 2018 Left ventricle: The cavity size was mildly dilated. Mildly    increased relative wall thickness. Systolic function was    moderately to severely reduced. The estimated ejection fraction    was in the range of 30% to 35%. Moderate diffuse hypokinesis with    no identifiable regional variations.  Acoustic contrast    opacification revealed no evidence ofthrombus.  - Left atrium: The atrium was moderately dilated.  - Right ventricle: Systolic function was mildly reduced.  - Right atrium: The atrium was mildly dilated.  - Pericardium, extracardiac: A trivial pericardial effusion was    identified.   ASSESSMENT AND PLAN:  1.  Chronic systolic CHF: He has recently been placed on Entresto 24/26 mg by Dr. Percival Spanish.  Labs are being drawn this afternoon.  Plan to titrate Entresto to 49/51 mg twice daily if kidney function allows for GDMT.  Blood pressure is not optimal for reduced EF.  He is also counseled on low-sodium diet and to  avoid snack foods and cold cuts which she has been eating a lot of lately.  Heart rate is reasonably controlled on metoprolol 100 mg daily.  Continue furosemide 80 mg 1 tablet in the morning half a tablet in the evening with increased dose to 80 mg twice daily for weight gain of 3 to 5 pounds in 3 days.  He verbalizes understanding.   2.  Atrial fibrillation: Heart rate is currently well controlled on metoprolol, he is tolerating Xarelto 20 mg daily without excessive bruising or bleeding.  3.  Obstructive sleep apnea: To see Dr. Claiborne Billings on November 7 for institution of CPAP.  4.  Morbid obesity: The patient will need more significant weight loss for optimal health and avoidance of worsening issues with heart failure.  He is aware of this.  Weight has gone up a few pounds since being seen last.  He is going back to some old habits.  I have counseled him on this.  Current medicines are reviewed at length with the patient today.  I have spent 25 min's dedicated to the care of this patient on the date of this encounter to include pre-visit review of records, assessment, management and diagnostic testing,with shared decision making.  Labs/ tests ordered today include: BNP, BMET, CBC.  Joe Blake. [place], ANP, AACC   06/24/2021 4:41 PM    [facility]  [facility] [address] Office [phone] Fax [phone]  Notice: This dictation was prepared with Dragon dictation along with smaller phrase technology. Any transcriptional errors that result from this process are unintentional and may not be corrected upon review.

## 2021-06-24 ENCOUNTER — Encounter: Payer: Self-pay | Admitting: Adult Health

## 2021-06-24 ENCOUNTER — Ambulatory Visit: Payer: Medicaid Other | Admitting: Adult Health

## 2021-06-24 ENCOUNTER — Other Ambulatory Visit: Payer: Self-pay

## 2021-06-24 VITALS — BP 136/74 | HR 70 | Ht 73.0 in | Wt 347.4 lb

## 2021-06-24 DIAGNOSIS — I10 Essential (primary) hypertension: Secondary | ICD-10-CM

## 2021-06-24 DIAGNOSIS — I447 Left bundle-branch block, unspecified: Secondary | ICD-10-CM

## 2021-06-24 DIAGNOSIS — I4819 Other persistent atrial fibrillation: Secondary | ICD-10-CM | POA: Diagnosis not present

## 2021-06-24 DIAGNOSIS — I5022 Chronic systolic (congestive) heart failure: Secondary | ICD-10-CM

## 2021-06-24 NOTE — Patient Instructions (Signed)
Medication Instructions:  No Changes *If you need a refill on your cardiac medications before your next appointment, please call your pharmacy*   Lab Work: BMET, BNP, CBC : Today If you have labs (blood work) drawn today and your tests are completely normal, you will receive your results only by: [place] (if you have MyChart) OR A paper copy in the mail If you have any lab test that is abnormal or we need to change your treatment, we will call you to review the results.   Testing/Procedures: No Testing   Follow-Up: At [facility], you and your health needs are our priority.  As part of our continuing mission to provide you with exceptional heart care, we have created designated Provider Care Teams.  These Care Teams include your primary Cardiologist (physician) and Advanced Practice Providers (APPs -  Physician Assistants and Nurse Practitioners) who all work together to provide you with the care you need, when you need it.  We recommend signing up for the patient portal called "MyChart".  Sign up information is provided on this After Visit Summary.  MyChart is used to connect with patients for Virtual Visits (Telemedicine).  Patients are able to view lab/test results, encounter notes, upcoming appointments, etc.  Non-urgent messages can be sent to your provider as well.   To learn more about what you can do with MyChart, go to [email or web address].    Your next appointment:   3 month(s)  The format for your next appointment:   In Person  Provider:   Minus Breeding, MD

## 2021-06-25 LAB — CBC
Hematocrit: 47.2 % (ref 37.5–51.0)
Hemoglobin: 17.5 g/dL (ref 13.0–17.7)
MCH: 34.4 pg — ABNORMAL HIGH (ref 26.6–33.0)
MCHC: 37.1 g/dL — ABNORMAL HIGH (ref 31.5–35.7)
MCV: 93 fL (ref 79–97)
Platelets: 217 10*3/uL (ref 150–450)
RBC: 5.08 x10E6/uL (ref 4.14–5.80)
RDW: 15.3 % (ref 11.6–15.4)
WBC: 6.8 10*3/uL (ref 3.4–10.8)

## 2021-06-25 LAB — BASIC METABOLIC PANEL
BUN/Creatinine Ratio: 19 (ref 9–20)
BUN: 24 mg/dL (ref 6–24)
CO2: 24 mmol/L (ref 20–29)
Calcium: 9.9 mg/dL (ref 8.7–10.2)
Chloride: 99 mmol/L (ref 96–106)
Creatinine, Ser: 1.25 mg/dL (ref 0.76–1.27)
Glucose: 140 mg/dL — ABNORMAL HIGH (ref 70–99)
Potassium: 4.5 mmol/L (ref 3.5–5.2)
Sodium: 141 mmol/L (ref 134–144)
eGFR: 68 mL/min/{1.73_m2} (ref >59)

## 2021-06-25 LAB — BRAIN NATRIURETIC PEPTIDE: BNP: 55.9 pg/mL (ref 0.0–100.0)

## 2021-06-30 ENCOUNTER — Telehealth: Payer: Self-pay

## 2021-06-30 NOTE — Telephone Encounter (Addendum)
Called patient regarding reuslts----- Message from Lendon Colonel, NP sent at 06/30/2021 11:29 AM EDT ----- I have reviewed his labs. Essentially normal values. Continue current regimen. No evidence of CHF.   KL

## 2021-07-11 ENCOUNTER — Ambulatory Visit: Payer: Medicaid Other | Admitting: Cardiovascular Disease

## 2021-07-12 ENCOUNTER — Other Ambulatory Visit: Payer: Self-pay | Admitting: Cardiology

## 2021-07-12 ENCOUNTER — Other Ambulatory Visit: Payer: Self-pay

## 2021-07-12 MED ORDER — ENTRESTO 24-26 MG PO TABS
1.0000 | ORAL_TABLET | Freq: Two times a day (BID) | ORAL | 1 refills | Status: DC
  Filled 2021-07-12 – 2021-07-25 (×2): qty 60, 30d supply, fill #0
  Filled 2021-10-14: qty 60, 30d supply, fill #1
  Filled 2021-10-14 – 2021-11-04 (×2): qty 60, 30d supply, fill #0

## 2021-07-19 ENCOUNTER — Other Ambulatory Visit: Payer: Self-pay

## 2021-07-25 ENCOUNTER — Other Ambulatory Visit: Payer: Self-pay

## 2021-07-27 ENCOUNTER — Other Ambulatory Visit: Payer: Self-pay

## 2021-08-12 ENCOUNTER — Other Ambulatory Visit: Payer: Self-pay | Admitting: Family Medicine

## 2021-08-12 ENCOUNTER — Other Ambulatory Visit: Payer: Self-pay

## 2021-08-12 DIAGNOSIS — K219 Gastro-esophageal reflux disease without esophagitis: Secondary | ICD-10-CM

## 2021-08-12 DIAGNOSIS — M1A0790 Idiopathic chronic gout, unspecified ankle and foot, without tophus (tophi): Secondary | ICD-10-CM

## 2021-08-12 MED ORDER — PANTOPRAZOLE SODIUM 40 MG PO TBEC
DELAYED_RELEASE_TABLET | Freq: Every day | ORAL | 0 refills | Status: DC
  Filled 2021-08-12: qty 90, 90d supply, fill #0

## 2021-08-12 MED ORDER — ALLOPURINOL 300 MG PO TABS
ORAL_TABLET | Freq: Every day | ORAL | 2 refills | Status: DC
Start: 2021-08-12 — End: 2021-11-23
  Filled 2021-08-12: qty 30, 30d supply, fill #0
  Filled 2021-11-17: qty 60, 60d supply, fill #0

## 2021-08-12 NOTE — Telephone Encounter (Signed)
Requested Prescriptions  Pending Prescriptions Disp Refills  . allopurinol (ZYLOPRIM) 300 MG tablet 30 tablet 2    Sig: TAKE 1 TABLET (300 MG TOTAL) BY MOUTH DAILY.     Endocrinology:  Gout Agents Passed - 08/12/2021  3:45 AM      Passed - Uric Acid in normal range and within 360 days    Uric Acid  Date Value Ref Range Status  04/11/2021 7.2 3.8 - 8.4 mg/dL Final    Comment:               Therapeutic target for gout patients: <6.0         Passed - Cr in normal range and within 360 days    Creatinine, Ser  Date Value Ref Range Status  06/24/2021 1.25 0.76 - 1.27 mg/dL Final         Passed - Valid encounter within last 12 months    Recent Outpatient Visits          4 months ago Idiopathic chronic gout of foot without tophus, unspecified laterality   [place], [place], MD   1 year ago Screening for colon cancer   [place], Enobong, MD   1 year ago Essential hypertension   [place], [place] J, NP   2 years ago Hypertensive heart disease with chronic combined systolic and diastolic congestive heart failure ([place])   [place], Enobong, MD   2 years ago Atrial fibrillation with RVR [facility])   [place], Enobong, MD      Future Appointments            In 1 month H[place], Jeneen Rinks, MD [place], [facility]   In 2 months Charlott Rakes, MD [place]           . pantoprazole (PROTONIX) 40 MG tablet 90 tablet 0    Sig: TAKE 1 TABLET (40 MG TOTAL) BY MOUTH DAILY.     Gastroenterology: Proton Pump Inhibitors Passed - 08/12/2021  3:45 AM      Passed - Valid encounter within last 12 months    Recent Outpatient Visits          4 months ago Idiopathic chronic gout of foot without tophus, unspecified laterality   [place], Enobong, MD   1 year ago Screening for colon cancer   [place], MD   1 year ago Essential hypertension   [place], [place] J, NP   2 years ago Hypertensive heart disease with chronic combined systolic and diastolic congestive heart failure [facility])   [place], Enobong, MD   2 years ago Atrial fibrillation with RVR [facility])   [place], Enobong, MD      Future Appointments            In 1 month Hochrein, Jeneen Rinks, MD [place], [facility]   In 2 months Charlott Rakes, MD [place]

## 2021-08-22 ENCOUNTER — Other Ambulatory Visit: Payer: Self-pay

## 2021-08-22 ENCOUNTER — Other Ambulatory Visit: Payer: Self-pay | Admitting: Family Medicine

## 2021-08-22 DIAGNOSIS — I4891 Unspecified atrial fibrillation: Secondary | ICD-10-CM

## 2021-08-23 ENCOUNTER — Other Ambulatory Visit: Payer: Self-pay

## 2021-09-01 ENCOUNTER — Encounter: Payer: Self-pay | Admitting: Cardiology

## 2021-09-26 ENCOUNTER — Ambulatory Visit: Payer: Medicaid Other | Admitting: Cardiology

## 2021-10-10 NOTE — Progress Notes (Deleted)
Cardiology Office Note   Date:  10/10/2021   ID:  Joe Blake, DOB [birth year], MRN [id number]  PCP:  Charlott Rakes, MD  Cardiologist:   Minus Breeding, MD   No chief complaint on file.      History of Present Illness: Joe Blake is a 56 y.o. male who presents for follow up of atrial fib with RVR.  He was admitted in March and had an EF of 20 - 25%.  He had been admitted at [facility] in [employer] in 2013 and was told his heart was "pumping at 33%".  There was no history of cath, or MI and he is a poor candidate for Myoview secondary to morbid obesity .   His last catheterization with [place] was 30 to 35% in 2019.     Since he was last seen ***   ***  he said he had no acute complaints other than some very rare episodes of dizziness.  He says that some days he breathes well and some days a little short of breath.  He does not correlate which he says he does not use.  He rarely eats at [employer].  Otherwise he cooks for himself.  He denies any consistent shortness of breath, PND or orthopnea.  He does not notice any palpitations, presyncope or syncope.  He is actually had about 40 pounds of weight loss by stopping sugar drinks!   Past Medical History:  Diagnosis Date   Acute systolic HF (heart failure) (HCC)    Atrial fibrillation (HCC)    Diabetes mellitus without complication ([place])    Hiatal hernia    History of esophageal ulcer    History of gastric ulcer    Hypertension    Morbid obesity ([place])     Past Surgical History:  Procedure Laterality Date   ABDOMINAL SURGERY     APPENDECTOMY     at 56 years old   BIOPSY  08/11/2018   Procedure: BIOPSY;  Surgeon: Laurence Spates, MD;  Location: [facility];  Service: Endoscopy;;   CARDIOVERSION N/A 03/18/2018   Procedure: CARDIOVERSION;  Surgeon: Skeet Latch, MD;  Location: [place];  Service: Cardiovascular;  Laterality: N/A;   ESOPHAGOGASTRODUODENOSCOPY (EGD) WITH PROPOFOL N/A 08/11/2018   Procedure:  ESOPHAGOGASTRODUODENOSCOPY (EGD) WITH PROPOFOL;  Surgeon: Laurence Spates, MD;  [facility];  Service: Endoscopy;  Laterality: N/A;     Current Outpatient Medications  Medication Sig Dispense Refill   allopurinol (ZYLOPRIM) 300 MG tablet TAKE 1 TABLET (300 MG TOTAL) BY MOUTH DAILY. 30 tablet 2   atorvastatin (LIPITOR) 20 MG tablet Take 1 tablet (20 mg total) by mouth daily. 30 tablet 3   COLCRYS 0.6 MG tablet TAKE 1 TABLET (0.6 MG TOTAL) BY MOUTH ONCE AS NEEDED (TAKE 1.2MG  AND THEN 0.6MG  1 HOUR LATER IF STILL HAVING SYMPTOMS). 30 tablet 2   dapagliflozin propanediol (FARXIGA) 5 MG TABS tablet Take 1 tablet (5 mg total) by mouth daily before breakfast. 30 tablet 6   furosemide (LASIX) 80 MG tablet TAKE 1 TABLETS BY MOUTH IN THE MORNING AND 1/2 TABLET IN THE EVENING 135 tablet 3   metoprolol succinate (TOPROL-XL) 100 MG 24 hr tablet Take 1 tablet (100 mg total) by mouth daily. Take with or immediately following a meal. 90 tablet 3   pantoprazole (PROTONIX) 40 MG tablet TAKE 1 TABLET (40 MG TOTAL) BY MOUTH DAILY. 90 tablet 0   rivaroxaban (XARELTO) 20 MG TABS tablet TAKE 1 TABLET (20 MG  TOTAL) BY MOUTH DAILY WITH SUPPER. 90 tablet 3   sacubitril-valsartan (ENTRESTO) 24-26 MG Take 1 tablet by mouth 2 (two) times daily. 60 tablet 1   No current facility-administered medications for this visit.    Allergies:   Patient has no known allergies.    ROS:  Please see the history of present illness.   Otherwise, review of systems are positive for ***.   All other systems are reviewed and negative.    PHYSICAL EXAM: VS:  There were no vitals taken for this visit. , BMI There is no height or weight on file to calculate BMI. GENERAL:  Well appearing NECK:  No jugular venous distention, waveform within normal limits, carotid upstroke brisk and symmetric, no bruits, no thyromegaly LUNGS:  Clear to auscultation bilaterally CHEST:  Unremarkable HEART:  PMI not displaced or sustained,S1 and S2  within normal limits, no S3, no S4, no clicks, no rubs, *** murmurs ABD:  Flat, positive bowel sounds normal in frequency in pitch, no bruits, no rebound, no guarding, no midline pulsatile mass, no hepatomegaly, no splenomegaly EXT:  2 plus pulses throughout, no edema, no cyanosis no clubbing    ***GENERAL:  Well appearing NECK:  No jugular venous distention, waveform within normal limits, carotid upstroke brisk and symmetric, no bruits, no thyromegaly LUNGS:  Clear to auscultation bilaterally CHEST:  Unremarkable HEART:  PMI not displaced or sustained,S1 and S2 within normal limits, no S3, no clicks, no rubs, no murmurs, irregular ABD:  Flat, positive bowel sounds normal in frequency in pitch, no bruits, no rebound, no guarding, no midline pulsatile mass, no hepatomegaly, no splenomegaly EXT:  2 plus pulses throughout, no edema, no cyanosis no clubbing   EKG:  EKG is *** ordered today. The ekg ordered today demonstrates atrial fibrillation, rate ***, left bundle branch block, left axis deviation   Recent Labs: 04/11/2021: ALT 12 06/24/2021: BNP 55.9; BUN 24; Creatinine, Ser 1.25; Hemoglobin 17.5; Platelets 217; Potassium 4.5; Sodium 141    Lipid Panel No results found for: CHOL, TRIG, HDL, CHOLHDL, VLDL, LDLCALC, LDLDIRECT    Wt Readings from Last 3 Encounters:  06/24/21 (!) 347 lb 6.4 oz (157.6 kg)  05/24/21 (!) 343 lb 12.8 oz (155.9 kg)  04/11/21 (!) 345 lb (156.5 kg)      Other studies Reviewed: Additional studies/ records that were reviewed today include: ***. Review of the above records demonstrates:  Please see elsewhere in the note.     ASSESSMENT AND PLAN:  Persistent atrial fibrillation [facility]):   [name] has a CHA2DS2 - VASc score of 3.  *** He tolerates anticoagulation.  I will check a CBC when he comes back for blood work.  HTN (hypertension):   ***  This is being managed in the context of treating his CHF  CRI-3:   Creat was *** 1.5 in August.  I  will assess this again in 10 days as below.   Obesity:   ***  I am very proud of his weight loss.  I encouraged more of the same.    Sleep Apnea:   ***  He has been seen by Dr. Claiborne Billings but unfortunately there is a waiting period for CPAP.  I notified them today and he is on the list.   Chronic Systolic HF:   ***   He came to [place] with a diagnosis of nonischemic cardiomyopathy few years ago and he has had class I-II symptoms.  I am going to titrate his meds by  stopping Cozaar and starting Entresto 24-26.  He will come back in 1 month for med titration and he can get a BNP in 10 days.    Current medicines are reviewed at length with the patient today.  The patient does not have concerns regarding medicines.  The following changes have been made: ***  Labs/ tests ordered today include: ***  No orders of the defined types were placed in this encounter.     Disposition:   FU with ***   Signed, Minus Breeding, MD  10/10/2021 7:19 AM    [facility]

## 2021-10-11 ENCOUNTER — Ambulatory Visit: Payer: Medicaid Other | Admitting: Cardiology

## 2021-10-12 ENCOUNTER — Ambulatory Visit: Payer: Medicaid Other | Admitting: Family Medicine

## 2021-10-14 ENCOUNTER — Other Ambulatory Visit: Payer: Self-pay

## 2021-10-20 ENCOUNTER — Other Ambulatory Visit: Payer: Self-pay

## 2021-11-04 ENCOUNTER — Other Ambulatory Visit: Payer: Self-pay

## 2021-11-10 NOTE — Progress Notes (Signed)
?  ?Cardiology Office Note ? ? ?Date:  11/11/2021  ? ?ID:  Joe Blake, DOB 26-Jun-1966, MRN [id number] ? ?PCP:  Charlott Rakes, MD  ?Cardiologist:   Minus Breeding, MD ? ? ?Chief Complaint  ?Patient presents with  ? Cardiomyopathy  ? ? ? ?  ?History of Present Illness: ?Joe Blake is a 56 y.o. male who presents for follow up of atrial fib with RVR.  He was admitted in March and had an EF of 20 - 25%.  He had been admitted at [facility] in [place] in 2013 and was told his heart was "pumping at 33%".  There was no history of cath, or MI and he is a poor candidate for Myoview secondary to morbid obesity .   His last catheterization with [place] was 30 to 35% in 2019.   ? ?At the last visit I changed him to [facility].  Since he was last seen several appts have been cancelled.   He said he was out of town in [place].  At the last visit he was supposed to increase his Delene Loll but it does not look like this happened.  He has been taking his other medications although he ran out a little bit because he was out of town. The patient denies any new symptoms such as chest discomfort, neck or arm discomfort. There has been no new shortness of breath, PND or orthopnea. There have been no reported palpitations, presyncope or syncope.  ? ? ?Past Medical History:  ?Diagnosis Date  ? Acute systolic HF (heart failure) ([place])   ? Atrial fibrillation ([place])   ? Diabetes mellitus without complication ([place])   ? Hiatal hernia   ? History of esophageal ulcer   ? History of gastric ulcer   ? Hypertension   ? Morbid obesity ([place])   ? ? ?Past Surgical History:  ?Procedure Laterality Date  ? ABDOMINAL SURGERY    ? APPENDECTOMY    ? at 56 years old  ? BIOPSY  08/11/2018  ? Procedure: BIOPSY;  Surgeon: Laurence Spates, MD;  Location: [facility] ENDOSCOPY;  Service: Endoscopy;;  ? CARDIOVERSION N/A 03/18/2018  ? Procedure: CARDIOVERSION;  Surgeon: Skeet Latch, MD;  Location: [facility] ENDOSCOPY;  Service: Cardiovascular;  Laterality: N/A;  ?  ESOPHAGOGASTRODUODENOSCOPY (EGD) WITH PROPOFOL N/A 08/11/2018  ? Procedure: ESOPHAGOGASTRODUODENOSCOPY (EGD) WITH PROPOFOL;  Surgeon: Laurence Spates, MD;  Location: [facility] ENDOSCOPY;  Service: Endoscopy;  Laterality: N/A;  ? ? ? ?Current Outpatient Medications  ?Medication Sig Dispense Refill  ? allopurinol (ZYLOPRIM) 300 MG tablet TAKE 1 TABLET (300 MG TOTAL) BY MOUTH DAILY. 30 tablet 2  ? atorvastatin (LIPITOR) 20 MG tablet Take 1 tablet (20 mg total) by mouth daily. 30 tablet 3  ? COLCRYS 0.6 MG tablet TAKE 1 TABLET (0.6 MG TOTAL) BY MOUTH ONCE AS NEEDED (TAKE 1.'2MG'$  AND THEN 0.'6MG'$  1 HOUR LATER IF STILL HAVING SYMPTOMS). 30 tablet 2  ? dapagliflozin propanediol (FARXIGA) 5 MG TABS tablet Take 1 tablet (5 mg total) by mouth daily before breakfast. 30 tablet 6  ? metoprolol succinate (TOPROL-XL) 100 MG 24 hr tablet Take 1 tablet (100 mg total) by mouth daily. Take with or immediately following a meal. 90 tablet 3  ? pantoprazole (PROTONIX) 40 MG tablet TAKE 1 TABLET (40 MG TOTAL) BY MOUTH DAILY. 90 tablet 0  ? sacubitril-valsartan (ENTRESTO) 49-51 MG Take 1 tablet by mouth 2 (two) times daily. 180 tablet 3  ? furosemide (LASIX) 80 MG tablet TAKE 1 TABLETS  BY MOUTH IN THE MORNING AND 1/2 TABLET IN THE EVENING 135 tablet 3  ? rivaroxaban (XARELTO) 20 MG TABS tablet TAKE 1 TABLET (20 MG TOTAL) BY MOUTH DAILY WITH SUPPER. 90 tablet 3  ? ?No current facility-administered medications for this visit.  ? ? ?Allergies:   Patient has no known allergies.  ? ? ?ROS:  Please see the history of present illness.   Otherwise, review of systems are positive for none.   All other systems are reviewed and negative.  ? ? ?PHYSICAL EXAM: ?VS:  BP 130/89   Pulse 76   Ht '6\' 1"'$  (1.854 m)   Wt (!) 355 lb 12.8 oz (161.4 kg)   SpO2 96%   BMI 46.94 kg/m?  , BMI Body mass index is 46.94 kg/m?. ?GENERAL:  Well appearing ?NECK:  No jugular venous distention, waveform within normal limits, carotid upstroke brisk and symmetric, no bruits, no  thyromegaly ?LUNGS:  Clear to auscultation bilaterally ?CHEST:  Unremarkable ?HEART:  PMI not displaced or sustained,S1 and S2 within normal limits, no S3, no clicks, no rubs, no murmurs, irregular  ?ABD:  Flat, positive bowel sounds normal in frequency in pitch, no bruits, no rebound, no guarding, no midline pulsatile mass, no hepatomegaly, no splenomegaly ?EXT:  2 plus pulses throughout, no edema, no cyanosis no clubbing ? ? ?EKG:  EKG is not ordered today. ? ? ? ? ?Recent Labs: ?04/11/2021: ALT 12 ?06/24/2021: BNP 55.9; BUN 24; Creatinine, Ser 1.25; Hemoglobin 17.5; Platelets 217; Potassium 4.5; Sodium 141  ? ? ?Lipid Panel ?No results found for: CHOL, TRIG, HDL, CHOLHDL, VLDL, LDLCALC, LDLDIRECT ?  ? ?Wt Readings from Last 3 Encounters:  ?11/11/21 (!) 355 lb 12.8 oz (161.4 kg)  ?06/24/21 (!) 347 lb 6.4 oz (157.6 kg)  ?05/24/21 (!) 343 lb 12.8 oz (155.9 kg)  ?  ? ? ?Other studies Reviewed: ?Additional studies/ records that were reviewed today include: Labs. ?Review of the above records demonstrates:  Please see elsewhere in the note.   ? ? ?ASSESSMENT AND PLAN: ? ?Persistent atrial fibrillation ([place]) ?Joe Blake has a CHA2DS2 - VASc score of 3.  He tolerates anticoagulation.  No change in therapy other than as below.   ? ?HTN (hypertension) ?This is being managed in the context of treating his CHF ? ?CRI-3- ?Creat was 1.25 in October.  This is improved.  No change in therapy.   ? ?Obesity ?We discussed diet.  He is having trouble finding foods he can eat because of the throat some teeth.  ? ?Sleep Apnea ?He does not yet have his CPAP and I sent a message to our nurse to have this expedited. \ ? ?Chronic Systolic HF ?I am going to titrate his Entresto to 49/51 twice daily.  Once we have titrated his meds completely we can consider getting a follow-up echocardiogram.  He seems to have class II symptoms. ? ? ?Current medicines are reviewed at length with the patient today.  The patient does not have concerns  regarding medicines. ? ?The following changes have been made: As above ? ?Labs/ tests ordered today include:   ? ?Orders Placed This Encounter  ?Procedures  ? Basic metabolic panel  ? ? ? ? ?Disposition:   FU APP in 3 months.   ? ? ?Signed, ?Minus Breeding, MD  ?11/11/2021 3:59 PM    ?[place] ? ? ? ?

## 2021-11-11 ENCOUNTER — Other Ambulatory Visit: Payer: Self-pay

## 2021-11-11 ENCOUNTER — Ambulatory Visit: Payer: Medicaid Other | Admitting: Cardiology

## 2021-11-11 ENCOUNTER — Encounter: Payer: Self-pay | Admitting: Cardiology

## 2021-11-11 VITALS — BP 130/89 | HR 76 | Ht 73.0 in | Wt 355.8 lb

## 2021-11-11 DIAGNOSIS — G4730 Sleep apnea, unspecified: Secondary | ICD-10-CM | POA: Diagnosis not present

## 2021-11-11 DIAGNOSIS — I4891 Unspecified atrial fibrillation: Secondary | ICD-10-CM

## 2021-11-11 DIAGNOSIS — Z5181 Encounter for therapeutic drug level monitoring: Secondary | ICD-10-CM

## 2021-11-11 DIAGNOSIS — I4819 Other persistent atrial fibrillation: Secondary | ICD-10-CM | POA: Diagnosis not present

## 2021-11-11 DIAGNOSIS — I10 Essential (primary) hypertension: Secondary | ICD-10-CM

## 2021-11-11 DIAGNOSIS — I5022 Chronic systolic (congestive) heart failure: Secondary | ICD-10-CM | POA: Diagnosis not present

## 2021-11-11 MED ORDER — ENTRESTO 49-51 MG PO TABS
1.0000 | ORAL_TABLET | Freq: Two times a day (BID) | ORAL | 3 refills | Status: DC
  Filled 2021-11-11: qty 180, 90d supply, fill #0
  Filled 2022-02-07: qty 180, 90d supply, fill #1

## 2021-11-11 MED ORDER — FUROSEMIDE 80 MG PO TABS
ORAL_TABLET | ORAL | 3 refills | Status: DC
  Filled 2021-11-11: qty 135, 90d supply, fill #0
  Filled 2022-02-07: qty 135, 90d supply, fill #1

## 2021-11-11 MED ORDER — RIVAROXABAN 20 MG PO TABS
ORAL_TABLET | Freq: Every day | ORAL | 3 refills | Status: DC
  Filled 2021-11-11: qty 90, 90d supply, fill #0
  Filled 2022-02-07: qty 90, 90d supply, fill #1

## 2021-11-11 NOTE — Patient Instructions (Addendum)
Medication Instructions:  ?INCREASE YOUR ENTRESTO TO 49-51 MG TWICE A DAY  ? ?*If you need a refill on your cardiac medications before your next appointment, please call your pharmacy* ? ?Lab Work: ?BMET 2 WEEKS AFTER YOU START INCREASE DOSE OF ENTRESTO  ? ?If you have labs (blood work) drawn today and your tests are completely normal, you will receive your results only by: ?MyChart Message (if you have MyChart) OR ?A paper copy in the mail ?If you have any lab test that is abnormal or we need to change your treatment, we will call you to review the results. ? ?Testing/Procedures: ?NONE ? ?Follow-Up: ?At [facility], you and your health needs are our priority.  As part of our continuing mission to provide you with exceptional heart care, we have created designated Provider Care Teams.  These Care Teams include your primary Cardiologist (physician) and Advanced Practice Providers (APPs -  Physician Assistants and Nurse Practitioners) who all work together to provide you with the care you need, when you need it. ? ?We recommend signing up for the patient portal called "MyChart".  Sign up information is provided on this After Visit Summary.  MyChart is used to connect with patients for Virtual Visits (Telemedicine).  Patients are able to view lab/test results, encounter notes, upcoming appointments, etc.  Non-urgent messages can be sent to your provider as well.   ?To learn more about what you can do with MyChart, go to [email or web address].   ? ?Your next appointment:   ?3 month(s) ? ?The format for your next appointment:   ?In Person ? ?Provider:   ? ?Arnold Long DNP  ? ?

## 2021-11-14 ENCOUNTER — Other Ambulatory Visit: Payer: Self-pay

## 2021-11-16 ENCOUNTER — Encounter: Payer: Self-pay | Admitting: Family Medicine

## 2021-11-17 ENCOUNTER — Other Ambulatory Visit: Payer: Self-pay

## 2021-11-18 ENCOUNTER — Other Ambulatory Visit: Payer: Self-pay

## 2021-11-23 ENCOUNTER — Encounter: Payer: Self-pay | Admitting: Family Medicine

## 2021-11-23 ENCOUNTER — Other Ambulatory Visit: Payer: Self-pay

## 2021-11-23 ENCOUNTER — Ambulatory Visit: Payer: Medicaid Other | Attending: Family Medicine | Admitting: Family Medicine

## 2021-11-23 VITALS — BP 120/84 | HR 77 | Ht 73.0 in | Wt 349.4 lb

## 2021-11-23 DIAGNOSIS — W57XXXA Bitten or stung by nonvenomous insect and other nonvenomous arthropods, initial encounter: Secondary | ICD-10-CM | POA: Diagnosis not present

## 2021-11-23 DIAGNOSIS — M1A0790 Idiopathic chronic gout, unspecified ankle and foot, without tophus (tophi): Secondary | ICD-10-CM

## 2021-11-23 DIAGNOSIS — S40861A Insect bite (nonvenomous) of right upper arm, initial encounter: Secondary | ICD-10-CM

## 2021-11-23 DIAGNOSIS — Z1211 Encounter for screening for malignant neoplasm of colon: Secondary | ICD-10-CM

## 2021-11-23 DIAGNOSIS — I5042 Chronic combined systolic (congestive) and diastolic (congestive) heart failure: Secondary | ICD-10-CM | POA: Diagnosis not present

## 2021-11-23 DIAGNOSIS — R7303 Prediabetes: Secondary | ICD-10-CM

## 2021-11-23 DIAGNOSIS — I4891 Unspecified atrial fibrillation: Secondary | ICD-10-CM | POA: Diagnosis not present

## 2021-11-23 DIAGNOSIS — E1169 Type 2 diabetes mellitus with other specified complication: Secondary | ICD-10-CM

## 2021-11-23 DIAGNOSIS — I110 Hypertensive heart disease with heart failure: Secondary | ICD-10-CM

## 2021-11-23 LAB — POCT GLYCOSYLATED HEMOGLOBIN (HGB A1C): HbA1c, POC (controlled diabetic range): 6.2 % (ref 0.0–7.0)

## 2021-11-23 LAB — GLUCOSE, POCT (MANUAL RESULT ENTRY): POC Glucose: 144 mg/dl — AB (ref 70–99)

## 2021-11-23 MED ORDER — COLCHICINE 0.6 MG PO TABS
ORAL_TABLET | ORAL | 2 refills | Status: DC
  Filled 2021-11-23: qty 30, 15d supply, fill #0
  Filled 2022-02-07: qty 30, 15d supply, fill #1
  Filled 2022-11-17: qty 30, 15d supply, fill #2

## 2021-11-23 MED ORDER — ATORVASTATIN CALCIUM 20 MG PO TABS
20.0000 mg | ORAL_TABLET | Freq: Every day | ORAL | 3 refills | Status: DC
  Filled 2021-11-23: qty 30, 30d supply, fill #0

## 2021-11-23 MED ORDER — DAPAGLIFLOZIN PROPANEDIOL 5 MG PO TABS
5.0000 mg | ORAL_TABLET | Freq: Every day | ORAL | 6 refills | Status: DC
  Filled 2021-11-23 – 2022-02-07 (×2): qty 30, 30d supply, fill #0
  Filled 2022-11-17: qty 30, 30d supply, fill #1

## 2021-11-23 MED ORDER — ALLOPURINOL 300 MG PO TABS
ORAL_TABLET | Freq: Every day | ORAL | 2 refills | Status: DC
Start: 2021-11-23 — End: 2022-12-18
  Filled 2021-11-23: qty 30, fill #0
  Filled 2022-02-07: qty 30, 30d supply, fill #0
  Filled 2022-11-17: qty 30, 30d supply, fill #1

## 2021-11-23 MED ORDER — DOXYCYCLINE HYCLATE 100 MG PO TABS
100.0000 mg | ORAL_TABLET | Freq: Two times a day (BID) | ORAL | 0 refills | Status: DC
  Filled 2021-11-23: qty 20, 10d supply, fill #0

## 2021-11-23 NOTE — Progress Notes (Signed)
Has insect bite in top of right shoulder. ?

## 2021-11-23 NOTE — Progress Notes (Signed)
? ?Subjective:  ?Patient ID: Joe Blake, male    DOB: 1965/12/15  Age: 56 y.o. MRN: [id number] ? ?CC: Diabetes ? ? ?HPI ?Joe Blake is a 56 y.o. year old male with a history of type 2 diabetes mellitus (A1c 6.2 ) hypertension, Gout, congestive heart failure (EF 30-35% from 05/2018), atrial fibrillation with RVR/atrial flutter status post DCCV on 03/2018 (currently on anticoagulation with Xarelto), obstructive sleep apnea here for follow-up visit. ? ?Interval History: ?He presents today complaining of an insect bite on the top of his right shoulder which he noticed a week ago and he has itching and soreness for 1 day with no fever, he has no joint pain, Has not been in the woods. ?He did not find a tick on his body.  He shows me a picture of the initial rash which looks like a rash with a bull's-eye. ? ? ?Last week he had a visit with Cardiology at which time his Entresto dose was increased.  He endorses compliance with his medications. ?Denies presence of pedal edema, chest pain or worsening dyspnea. ? ?Med list reveals he is on a PPI blood he states he does not have dyspepsia or reflux symptoms. ?Gout has been stable with no recent flares. ?Denies additional concerns today. ?Past Medical History:  ?Diagnosis Date  ? Acute systolic HF (heart failure) ([place])   ? Atrial fibrillation ([place])   ? Diabetes mellitus without complication ([place])   ? Hiatal hernia   ? History of esophageal ulcer   ? History of gastric ulcer   ? Hypertension   ? Morbid obesity ([place])   ? ? ?Past Surgical History:  ?Procedure Laterality Date  ? ABDOMINAL SURGERY    ? APPENDECTOMY    ? at 56 years old  ? BIOPSY  08/11/2018  ? Procedure: BIOPSY;  Surgeon: Laurence Spates, MD;  Location: [facility] ENDOSCOPY;  Service: Endoscopy;;  ? CARDIOVERSION N/A 03/18/2018  ? Procedure: CARDIOVERSION;  Surgeon: Skeet Latch, MD;  Location: [facility] ENDOSCOPY;  Service: Cardiovascular;  Laterality: N/A;  ? ESOPHAGOGASTRODUODENOSCOPY (EGD) WITH PROPOFOL N/A 08/11/2018  ?  Procedure: ESOPHAGOGASTRODUODENOSCOPY (EGD) WITH PROPOFOL;  Surgeon: Laurence Spates, MD;  Location: [facility] ENDOSCOPY;  Service: Endoscopy;  Laterality: N/A;  ? ? ?Family History  ?Problem Relation Age of Onset  ? Heart attack Paternal Grandfather   ? Hyperlipidemia Mother   ? Hypertension Mother   ? ? ?Social History  ? ?Socioeconomic History  ? Marital status: Single  ?  Spouse name: Not on file  ? Number of children: Not on file  ? Years of education: Not on file  ? Highest education level: Not on file  ?Occupational History  ? Not on file  ?Tobacco Use  ? Smoking status: Never  ? Smokeless tobacco: Never  ?Vaping Use  ? Vaping Use: Never used  ?Substance and Sexual Activity  ? Alcohol use: No  ? Drug use: No  ? Sexual activity: Yes  ?  Partners: Female  ?Other Topics Concern  ? Not on file  ?Social History Narrative  ? Not on file  ? ?Social Determinants of Health  ? ?Financial Resource Strain: Not on file  ?Food Insecurity: Not on file  ?Transportation Needs: Not on file  ?Physical Activity: Not on file  ?Stress: Not on file  ?Social Connections: Not on file  ? ? ?No Known Allergies ? ?Outpatient Medications Prior to Visit  ?Medication Sig Dispense Refill  ? furosemide (LASIX) 80 MG tablet TAKE 1 TABLETS BY MOUTH IN  THE MORNING AND 1/2 TABLET IN THE EVENING 135 tablet 3  ? metoprolol succinate (TOPROL-XL) 100 MG 24 hr tablet Take 1 tablet (100 mg total) by mouth daily. Take with or immediately following a meal. 90 tablet 3  ? rivaroxaban (XARELTO) 20 MG TABS tablet TAKE 1 TABLET (20 MG TOTAL) BY MOUTH DAILY WITH SUPPER. 90 tablet 3  ? sacubitril-valsartan (ENTRESTO) 49-51 MG Take 1 tablet by mouth 2 (two) times daily. 180 tablet 3  ? allopurinol (ZYLOPRIM) 300 MG tablet TAKE 1 TABLET (300 MG TOTAL) BY MOUTH DAILY. 30 tablet 2  ? atorvastatin (LIPITOR) 20 MG tablet Take 1 tablet (20 mg total) by mouth daily. 30 tablet 3  ? COLCRYS 0.6 MG tablet TAKE 1 TABLET (0.6 MG TOTAL) BY MOUTH ONCE AS NEEDED (TAKE 1.'2MG'$  AND THEN  0.'6MG'$  1 HOUR LATER IF STILL HAVING SYMPTOMS). 30 tablet 2  ? dapagliflozin propanediol (FARXIGA) 5 MG TABS tablet Take 1 tablet (5 mg total) by mouth daily before breakfast. 30 tablet 6  ? pantoprazole (PROTONIX) 40 MG tablet TAKE 1 TABLET (40 MG TOTAL) BY MOUTH DAILY. 90 tablet 0  ? ?No facility-administered medications prior to visit.  ? ? ? ?ROS ?Review of Systems  ?Constitutional:  Negative for activity change and appetite change.  ?HENT:  Negative for sinus pressure and sore throat.   ?Eyes:  Negative for visual disturbance.  ?Respiratory:  Negative for cough, chest tightness and shortness of breath.   ?Cardiovascular:  Negative for chest pain and leg swelling.  ?Gastrointestinal:  Negative for abdominal distention, abdominal pain, constipation and diarrhea.  ?Endocrine: Negative.   ?Genitourinary:  Negative for dysuria.  ?Musculoskeletal:  Negative for joint swelling and myalgias.  ?Skin:  Positive for rash.  ?Allergic/Immunologic: Negative.   ?Neurological:  Negative for weakness, light-headedness and numbness.  ?Psychiatric/Behavioral:  Negative for dysphoric mood and suicidal ideas.   ? ?Objective:  ?BP 120/84   Pulse 77   Ht '6\' 1"'$  (1.854 m)   Wt (!) 349 lb 6.4 oz (158.5 kg)   SpO2 95%   BMI 46.10 kg/m?  ? ? ?  11/23/2021  ?  1:55 PM 11/11/2021  ?  3:14 PM 06/24/2021  ?  2:46 PM  ?BP/Weight  ?Systolic BP 809 983 382  ?Diastolic BP 84 89 74  ?Wt. (Lbs) 349.4 355.8 347.4  ?BMI 46.1 kg/m2 46.94 kg/m2 45.83 kg/m2  ? ? ? ? ?Physical Exam ?Constitutional:   ?   Appearance: He is well-developed.  ?Cardiovascular:  ?   Rate and Rhythm: Normal rate.  ?   Heart sounds: Normal heart sounds. No murmur heard. ?Pulmonary:  ?   Effort: Pulmonary effort is normal.  ?   Breath sounds: Normal breath sounds. No wheezing or rales.  ?Chest:  ?   Chest wall: No tenderness.  ?Abdominal:  ?   General: Bowel sounds are normal. There is no distension.  ?   Palpations: Abdomen is soft. There is no mass.  ?   Tenderness: There is  no abdominal tenderness.  ?Musculoskeletal:     ?   General: Normal range of motion.  ?   Right lower leg: No edema.  ?   Left lower leg: No edema.  ?Skin: ?   Comments: Superior posterior aspect of right shoulder with large patch which is erythematous with central punctum; bull's-eye pattern fading  ?Neurological:  ?   Mental Status: He is alert and oriented to person, place, and time.  ?Psychiatric:     ?  Mood and Affect: Mood normal.  ? ?Diabetic Foot Exam - Simple   ?Simple Foot Form ? 11/23/2021  3:09 PM  ?Visual Inspection ?See comments: Yes ?Sensation Testing ?Intact to touch and monofilament testing bilaterally: Yes ?Pulse Check ?Posterior Tibialis and Dorsalis pulse intact bilaterally: Yes ?Comments ?Calluses on medial aspect of the great big toes bilaterally.  No ulceration or skin breakdown. ?  ? ? ? ? ? ? ?  Latest Ref Rng & Units 06/24/2021  ?  3:27 PM 04/11/2021  ?  3:17 PM 06/24/2020  ?  2:28 PM  ?CMP  ?Glucose 70 - 99 mg/dL 140   140   96    ?BUN 6 - 24 mg/dL '24   26   21    '$ ?Creatinine 0.76 - 1.27 mg/dL 1.25   1.50   1.40    ?Sodium 134 - 144 mmol/L 141   139   141    ?Potassium 3.5 - 5.2 mmol/L 4.5   4.1   4.3    ?Chloride 96 - 106 mmol/L 99   97   100    ?CO2 20 - 29 mmol/L '24   25   25    '$ ?Calcium 8.7 - 10.2 mg/dL 9.9   10.0   9.7    ?Total Protein 6.0 - 8.5 g/dL  7.9     ?Total Bilirubin 0.0 - 1.2 mg/dL  1.2     ?Alkaline Phos 44 - 121 IU/L  167     ?AST 0 - 40 IU/L  18     ?ALT 0 - 44 IU/L  12     ? ? ?Lipid Panel  ?No results found for: CHOL, TRIG, HDL, CHOLHDL, VLDL, LDLCALC, LDLDIRECT ? ?CBC ?   ?Component Value Date/Time  ? WBC 6.8 06/24/2021 1527  ? WBC 7.6 08/15/2018 0620  ? RBC 5.08 06/24/2021 1527  ? RBC 2.64 (L) 08/15/2018 6720  ? HGB 17.5 06/24/2021 1527  ? HCT 47.2 06/24/2021 1527  ? PLT 217 06/24/2021 1527  ? MCV 93 06/24/2021 1527  ? MCH 34.4 (H) 06/24/2021 1527  ? MCH 29.9 08/15/2018 0620  ? MCHC 37.1 (H) 06/24/2021 1527  ? MCHC 30.6 08/15/2018 0620  ? RDW 15.3 06/24/2021 1527  ?  LYMPHSABS 1.4 01/20/2020 1045  ? MONOABS 0.9 08/10/2018 1453  ? EOSABS 0.2 01/20/2020 1045  ? BASOSABS 0.1 01/20/2020 1045  ? ? ?Lab Results  ?Component Value Date  ? HGBA1C 6.2 11/23/2021  ? ? ?Assessment & Plan:

## 2021-11-23 NOTE — Patient Instructions (Signed)
Type 2 Diabetes Mellitus, Diagnosis, Adult °Type 2 diabetes (type 2 diabetes mellitus) is a long-term (chronic) disease. It may happen when there is one or both of these problems: °The pancreas does not make enough insulin. °The body does not react in a normal way to insulin that it makes. °Insulin lets sugars go into cells in your body. If you have type 2 diabetes, sugars cannot get into your cells. Sugars build up in the blood. This causes high blood sugar. °What are the causes? °The exact cause of this condition is not known. °What increases the risk? °Having type 2 diabetes in your family. °Being overweight or very overweight. °Not being active. °Your body not reacting in a normal way to the insulin it makes. °Having higher than normal blood sugar over time. °Having a type of diabetes when you were pregnant. °Having a condition that causes small fluid-filled sacs on your ovaries. °What are the signs or symptoms? °At first, you may have no symptoms. You will get symptoms slowly. They may include: °More thirst than normal. °More hunger than normal. °Needing to pee more than normal. °Losing weight without trying. °Feeling tired. °Feeling weak. °Seeing things blurry. °Dark patches on your skin. °How is this treated? °This condition may be treated by a diabetes expert. You may need to: °Follow an eating plan made by a food expert (dietitian). °Get regular exercise. °Find ways to deal with stress. °Check blood sugar as often as told. °Take medicines. °Your doctor will set treatment goals for you. Your blood sugar should be at these levels: °Before meals: 80-130 mg/dL (4.4-7.2 mmol/L). °After meals: below 180 mg/dL (10 mmol/L). °Over the last 2-3 months: less than 7%. °Follow these instructions at home: °Medicines °Take your diabetes medicines or insulin every day. °Take medicines as told to help you prevent other problems caused by this condition. You may need: °Aspirin. °Medicine to lower cholesterol. °Medicine to  control blood pressure. °Questions to ask your doctor °Should I meet with a diabetes educator? °What medicines do I need, and when should I take them? °What will I need to treat my condition at home? °When should I check my blood sugar? °Where can I find a support group? °Who can I call if I have questions? °When is my next doctor visit? °General instructions °Take over-the-counter and prescription medicines only as told by your doctor. °Keep all follow-up visits. °Where to find more information °For help and guidance and more information about diabetes, please go to: °American Diabetes Association (ADA): www.diabetes.org °American Association of Diabetes Care and Education Specialists (ADCES): www.diabeteseducator.org °International Diabetes Federation (IDF): www.idf.org °Contact a doctor if: °Your blood sugar is at or above 240 mg/dL (13.3 mmol/L) for 2 days in a row. °You have been sick for 2 days or more, and you are not getting better. °You have had a fever for 2 days or more, and you are not getting better. °You have any of these problems for more than 6 hours: °You cannot eat or drink. °You feel like you may vomit. °You vomit. °You have watery poop (diarrhea). °Get help right away if: °Your blood sugar is lower than 54 mg/dL (3 mmol/L). °You feel mixed up (confused). °You have trouble thinking clearly. °You have trouble breathing. °You have medium or large ketone levels in your pee. °These symptoms may be an emergency. Get help right away. Call your local emergency services (911 in the [place] °Do not wait to see if the symptoms will go away. °Do not drive yourself   to the hospital. °Summary °Type 2 diabetes is a long-term disease. Your pancreas may not make enough insulin, or your body may not react in a normal way to insulin that it makes. °This condition is treated with an eating plan, lifestyle changes, and medicines. °Your doctor will set treatment goals for you. These will help you keep your blood sugar  in a healthy range. °Keep all follow-up visits. °This information is not intended to replace advice given to you by your health care provider. Make sure you discuss any questions you have with your health care provider. °Document Revised: 11/15/2020 Document Reviewed: 11/15/2020 °Elsevier Patient Education © 2022 [employer] ° °

## 2021-11-24 LAB — CMP14+EGFR
ALT: 16 IU/L (ref 0–44)
AST: 31 IU/L (ref 0–40)
Albumin/Globulin Ratio: 1.3 (ref 1.2–2.2)
Albumin: 4.6 g/dL (ref 3.8–4.9)
Alkaline Phosphatase: 104 IU/L (ref 44–121)
BUN/Creatinine Ratio: 21 — ABNORMAL HIGH (ref 9–20)
BUN: 36 mg/dL — ABNORMAL HIGH (ref 6–24)
Bilirubin Total: 0.6 mg/dL (ref 0.0–1.2)
CO2: 24 mmol/L (ref 20–29)
Calcium: 10.3 mg/dL — ABNORMAL HIGH (ref 8.7–10.2)
Chloride: 96 mmol/L (ref 96–106)
Creatinine, Ser: 1.74 mg/dL — ABNORMAL HIGH (ref 0.76–1.27)
Globulin, Total: 3.5 g/dL (ref 1.5–4.5)
Glucose: 122 mg/dL — ABNORMAL HIGH (ref 70–99)
Potassium: 4.4 mmol/L (ref 3.5–5.2)
Sodium: 142 mmol/L (ref 134–144)
Total Protein: 8.1 g/dL (ref 6.0–8.5)
eGFR: 46 mL/min/{1.73_m2} — ABNORMAL LOW (ref >59)

## 2021-11-24 LAB — MICROALBUMIN / CREATININE URINE RATIO
Creatinine, Urine: 190.2 mg/dL
Microalb/Creat Ratio: 14 mg/g creat (ref 0–29)
Microalbumin, Urine: 26.7 ug/mL

## 2021-11-24 LAB — LP+NON-HDL CHOLESTEROL
Cholesterol, Total: 186 mg/dL (ref 100–199)
HDL: 43 mg/dL (ref >39)
LDL Chol Calc (NIH): 114 mg/dL — ABNORMAL HIGH (ref 0–99)
Total Non-HDL-Chol (LDL+VLDL): 143 mg/dL — ABNORMAL HIGH (ref 0–129)
Triglycerides: 164 mg/dL — ABNORMAL HIGH (ref 0–149)
VLDL Cholesterol Cal: 29 mg/dL (ref 5–40)

## 2021-11-24 LAB — LYME DISEASE SEROLOGY W/REFLEX: Lyme Total Antibody EIA: NEGATIVE

## 2021-11-25 ENCOUNTER — Other Ambulatory Visit: Payer: Self-pay

## 2021-11-25 ENCOUNTER — Other Ambulatory Visit: Payer: Self-pay | Admitting: Family Medicine

## 2021-11-25 DIAGNOSIS — E1169 Type 2 diabetes mellitus with other specified complication: Secondary | ICD-10-CM

## 2021-11-25 MED ORDER — ATORVASTATIN CALCIUM 40 MG PO TABS
40.0000 mg | ORAL_TABLET | Freq: Every day | ORAL | 1 refills | Status: DC
  Filled 2021-11-25 – 2022-02-07 (×2): qty 90, 90d supply, fill #0

## 2021-12-06 ENCOUNTER — Ambulatory Visit: Payer: Medicaid Other | Admitting: Family Medicine

## 2022-02-07 ENCOUNTER — Other Ambulatory Visit: Payer: Self-pay

## 2022-02-17 ENCOUNTER — Ambulatory Visit: Payer: Medicaid Other | Admitting: Adult Health

## 2022-02-17 ENCOUNTER — Other Ambulatory Visit: Payer: Self-pay

## 2022-05-18 NOTE — Progress Notes (Deleted)
Cardiology Clinic Note   Patient Name: Joe Blake Date of Encounter: 05/18/2022  Primary Care Provider:  Charlott Rakes, MD Primary Cardiologist:  Joe Breeding, MD  Patient Profile    56 year old male with history of atrial fib with RVR, now controlled on Xarelto with CHADS VASC Score of 3. .  He was admitted in March and had an EF of 20 - 25%.    There was no history of cath, or MI and he is a poor candidate for Myoview secondary to morbid obesity .Episodic medical non-compliance due to frequent travel. He also has a hx of OSA, and on last visit was having trouble acquiring CPAP. On last visit with Joe Blake, Delene Loll was increased to 49/51 mg, and continued on GDMT.   Past Medical History    Past Medical History:  Diagnosis Date   Acute systolic HF (heart failure) (HCC)    Atrial fibrillation (HCC)    Diabetes mellitus without complication ([place])    Hiatal hernia    History of esophageal ulcer    History of gastric ulcer    Hypertension    Morbid obesity ([place])    Past Surgical History:  Procedure Laterality Date   ABDOMINAL SURGERY     APPENDECTOMY     at 56 years old   BIOPSY  08/11/2018   Procedure: BIOPSY;  Surgeon: Laurence Spates, MD;  Location: [facility];  Service: Endoscopy;;   CARDIOVERSION N/A 03/18/2018   Procedure: CARDIOVERSION;  Surgeon: Skeet Latch, MD;  Location: [facility];  Service: Cardiovascular;  Laterality: N/A;   ESOPHAGOGASTRODUODENOSCOPY (EGD) WITH PROPOFOL N/A 08/11/2018   Procedure: ESOPHAGOGASTRODUODENOSCOPY (EGD) WITH PROPOFOL;  Surgeon: Laurence Spates, MD;  Location: [facility];  Service: Endoscopy;  Laterality: N/A;    Allergies  No Known Allergies  History of Present Illness    Joe Blake comes today for ongoing assessment and management of HFrEF, persistent atrial fibrillation, HTN, with hx of OSA on CPAP, and obesity. On last office visit with Joe Blake, Delene Loll was titrated to 49/51 mg.   Home Medications    Current  Outpatient Medications  Medication Sig Dispense Refill   allopurinol (ZYLOPRIM) 300 MG tablet TAKE 1 TABLET (300 MG TOTAL) BY MOUTH DAILY. 30 tablet 2   atorvastatin (LIPITOR) 40 MG tablet Take 1 tablet (40 mg total) by mouth daily. 90 tablet 1   colchicine (COLCRYS) 0.6 MG tablet TAKE 1 TABLET (0.6 MG TOTAL) BY MOUTH ONCE AS NEEDED (TAKE 1.'2MG'$  (2 tablets)AND THEN 0.79M (1 tablet) 1 HOUR LATER IF STILL HAVING SYMPTOMS). 30 tablet 2   dapagliflozin propanediol (FARXIGA) 5 MG TABS tablet Take 1 tablet (5 mg total) by mouth daily before breakfast. 30 tablet 6   doxycycline (VIBRA-TABS) 100 MG tablet Take 1 tablet (100 mg total) by mouth 2 (two) times daily. 20 tablet 0   furosemide (LASIX) 80 MG tablet TAKE 1 TABLETS BY MOUTH IN THE MORNING AND 1/2 TABLET IN THE EVENING 135 tablet 3   metoprolol succinate (TOPROL-XL) 100 MG 24 hr tablet Take 1 tablet (100 mg total) by mouth daily. Take with or immediately following a meal. 90 tablet 3   rivaroxaban (XARELTO) 20 MG TABS tablet TAKE 1 TABLET (20 MG TOTAL) BY MOUTH DAILY WITH SUPPER. 90 tablet 3   sacubitril-valsartan (ENTRESTO) 49-51 MG Take 1 tablet by mouth 2 (two) times daily. 180 tablet 3   No current facility-administered medications for this visit.     Family History    Family History  Problem Relation Age of Onset   Heart attack Paternal Grandfather    Hyperlipidemia Mother    Hypertension Mother    He indicated that his mother is alive. He indicated that his father is deceased. He indicated that his paternal grandfather is deceased.  Social History    Social History   Socioeconomic History   Marital status: Single    Spouse name: Not on file   Number of children: Not on file   Years of education: Not on file   Highest education level: Not on file  Occupational History   Not on file  Tobacco Use   Smoking status: Never   Smokeless tobacco: Never  Vaping Use   Vaping Use: Never used  Substance and Sexual Activity   Alcohol  use: No   Drug use: No   Sexual activity: Yes    Partners: Female  Other Topics Concern   Not on file  Social History Narrative   Not on file   Social Determinants of Health   Financial Resource Strain: Medium Risk (11/20/2017)   Overall Financial Resource Strain (CARDIA)    Difficulty of Paying Living Expenses: Somewhat hard  Food Insecurity: Unknown (11/20/2017)   Hunger Vital Sign    Worried About Running Out of Food in the Last Year: Patient refused    [place] in the Last Year: Patient refused  Transportation Needs: No Transportation Needs (11/20/2017)   PRAPARE - Hydrologist (Medical): No    Lack of Transportation (Non-Medical): No  Physical Activity: Unknown (11/20/2017)   Exercise Vital Sign    Days of Exercise per Week: Patient refused    Minutes of Exercise per Session: Patient refused  Stress: No Stress Concern Present (11/20/2017)   [place]    Feeling of Stress : Only a little  Social Connections: Unknown (11/20/2017)   Social Connection and Isolation Panel [NHANES]    Frequency of Communication with Friends and Family: More than three times a week    Frequency of Social Gatherings with Friends and Family: Twice a week    Attends Religious Services: Patient refused    Marine scientist or Organizations: No    Attends Archivist Meetings: Never    Marital Status: Never married  Intimate Partner Violence: Unknown (11/20/2017)   Humiliation, Afraid, Rape, and Kick questionnaire    Fear of Current or Ex-Partner: Patient refused    Emotionally Abused: Patient refused    Physically Abused: Patient refused    Sexually Abused: Patient refused     Review of Systems    General:  No chills, fever, night sweats or weight changes.  Cardiovascular:  No chest pain, dyspnea on exertion, edema, orthopnea, palpitations, paroxysmal nocturnal  dyspnea. Dermatological: No rash, lesions/masses Respiratory: No cough, dyspnea Urologic: No hematuria, dysuria Abdominal:   No nausea, vomiting, diarrhea, bright red blood per rectum, melena, or hematemesis Neurologic:  No visual changes, wkns, changes in mental status. All other systems reviewed and are otherwise negative except as noted above.     Physical Exam    VS:  There were no vitals taken for this visit. , BMI There is no height or weight on file to calculate BMI.     GEN: Well nourished, well developed, in no acute distress. HEENT: normal. Neck: Supple, no JVD, carotid bruits, or masses. Cardiac: RRR, no murmurs, rubs, or gallops. No clubbing, cyanosis, edema.  Radials/DP/PT 2+  and equal bilaterally.  Respiratory:  Respirations regular and unlabored, clear to auscultation bilaterally. GI: Soft, nontender, nondistended, BS + x 4. MS: no deformity or atrophy. Skin: warm and dry, no rash. Neuro:  Strength and sensation are intact. Psych: Normal affect.  Accessory Clinical Findings    ECG personally reviewed by me today- *** - No acute changes  Lab Results  Component Value Date   WBC 6.8 06/24/2021   HGB 17.5 06/24/2021   HCT 47.2 06/24/2021   MCV 93 06/24/2021   PLT 217 06/24/2021   Lab Results  Component Value Date   CREATININE 1.74 (H) 11/23/2021   BUN 36 (H) 11/23/2021   NA 142 11/23/2021   K 4.4 11/23/2021   CL 96 11/23/2021   CO2 24 11/23/2021   Lab Results  Component Value Date   ALT 16 11/23/2021   AST 31 11/23/2021   ALKPHOS 104 11/23/2021   BILITOT 0.6 11/23/2021   Lab Results  Component Value Date   CHOL 186 11/23/2021   HDL 43 11/23/2021   LDLCALC 114 (H) 11/23/2021   TRIG 164 (H) 11/23/2021    Lab Results  Component Value Date   HGBA1C 6.2 11/23/2021    Review of Prior Studies: Echocardiogram; 05/30/2020 Left ventricle: The cavity size was mildly dilated. Mildly    increased relative wall thickness. Systolic function was     moderately to severely reduced. The estimated ejection fraction    was in the range of 30% to 35%. Moderate diffuse hypokinesis with    no identifiable regional variations. Acoustic contrast    opacification revealed no evidence ofthrombus.  - Left atrium: The atrium was moderately dilated.  - Right ventricle: Systolic function was mildly reduced.  - Right atrium: The atrium was mildly dilated.  - Pericardium, extracardiac: A trivial pericardial effusion was    identified.   Assessment & Plan   1.  ***    Current medicines are reviewed at length with the patient today.  I have spent *** min's  dedicated to the care of this patient on the date of this encounter to include pre-visit review of records, assessment, management and diagnostic testing,with shared decision making. Signed, Phill Myron. West Pugh, ANP, AACC   05/18/2022 2:10 PM      Office [phone] Fax [phone]  Notice: This dictation was prepared with Dragon dictation along with smaller phrase technology. Any transcriptional errors that result from this process are unintentional and may not be corrected upon review.

## 2022-05-19 ENCOUNTER — Ambulatory Visit: Payer: Medicaid Other | Admitting: Adult Health

## 2022-05-29 ENCOUNTER — Ambulatory Visit: Payer: Medicaid Other | Admitting: Family Medicine

## 2022-06-16 ENCOUNTER — Ambulatory Visit: Payer: Medicaid Other | Admitting: Adult Health

## 2022-07-11 ENCOUNTER — Encounter: Payer: Self-pay | Admitting: Family Medicine

## 2022-07-12 ENCOUNTER — Ambulatory Visit: Payer: Medicaid Other | Admitting: Family Medicine

## 2022-07-20 NOTE — Progress Notes (Deleted)
Cardiology Clinic Note   Patient Name: Joe Blake Date of Encounter: 07/20/2022  Primary Care Provider:  Charlott Rakes, MD Primary Cardiologist:  Joe Breeding, MD  Patient Profile    56 year old male patient with history of atrial fibs with RVR, hospital admission March 2023 with echo revealing EF of 20 to 25%.  He was placed on Entresto with difficult titration as he has missed several appointments as he goes back and forth to [place].  Last seen by Dr. Percival Blake on 11/11/2021.  Other history includes diabetes mellitus, hiatal hernia, esophageal ulcers, hypertension and morbid obesity.  Past Medical History    Past Medical History:  Diagnosis Date   Acute systolic HF (heart failure) (HCC)    Atrial fibrillation (HCC)    Diabetes mellitus without complication ([place])    Hiatal hernia    History of esophageal ulcer    History of gastric ulcer    Hypertension    Morbid obesity ([place])    Past Surgical History:  Procedure Laterality Date   ABDOMINAL SURGERY     APPENDECTOMY     at 56 years old   BIOPSY  08/11/2018   Procedure: BIOPSY;  Surgeon: Joe Spates, MD;  Location: [facility];  Service: Endoscopy;;   CARDIOVERSION N/A 03/18/2018   Procedure: CARDIOVERSION;  Surgeon: Joe Latch, MD;  Location: [facility];  Service: Cardiovascular;  Laterality: N/A;   ESOPHAGOGASTRODUODENOSCOPY (EGD) WITH PROPOFOL N/A 08/11/2018   Procedure: ESOPHAGOGASTRODUODENOSCOPY (EGD) WITH PROPOFOL;  Surgeon: Joe Spates, MD;  Location: [facility];  Service: Endoscopy;  Laterality: N/A;    Allergies  No Known Allergies  History of Present Illness    Joe Blake presents today for ongoing assessment and management of hypertension, HFrEF with most recent EF of 20 to 25% per echo on 05/30/2018.  The patient travels a lot and is out of town missing several appointments.  Titration of Entresto has been difficult as follow-ups have been inconsistent.  Other history includes atrial flutter with  RVR,  Home Medications    Current Outpatient Medications  Medication Sig Dispense Refill   allopurinol (ZYLOPRIM) 300 MG tablet TAKE 1 TABLET (300 MG TOTAL) BY MOUTH DAILY. 30 tablet 2   atorvastatin (LIPITOR) 40 MG tablet Take 1 tablet (40 mg total) by mouth daily. 90 tablet 1   colchicine (COLCRYS) 0.6 MG tablet TAKE 1 TABLET (0.6 MG TOTAL) BY MOUTH ONCE AS NEEDED (TAKE 1.'2MG'$  (2 tablets)AND THEN 0.55M (1 tablet) 1 HOUR LATER IF STILL HAVING SYMPTOMS). 30 tablet 2   dapagliflozin propanediol (FARXIGA) 5 MG TABS tablet Take 1 tablet (5 mg total) by mouth daily before breakfast. 30 tablet 6   doxycycline (VIBRA-TABS) 100 MG tablet Take 1 tablet (100 mg total) by mouth 2 (two) times daily. 20 tablet 0   furosemide (LASIX) 80 MG tablet TAKE 1 TABLETS BY MOUTH IN THE MORNING AND 1/2 TABLET IN THE EVENING 135 tablet 3   metoprolol succinate (TOPROL-XL) 100 MG 24 hr tablet Take 1 tablet (100 mg total) by mouth daily. Take with or immediately following a meal. 90 tablet 3   rivaroxaban (XARELTO) 20 MG TABS tablet TAKE 1 TABLET (20 MG TOTAL) BY MOUTH DAILY WITH SUPPER. 90 tablet 3   sacubitril-valsartan (ENTRESTO) 49-51 MG Take 1 tablet by mouth 2 (two) times daily. 180 tablet 3   No current facility-administered medications for this visit.     Family History    Family History  Problem Relation Age of Onset   Heart attack Paternal Grandfather  Hyperlipidemia Mother    Hypertension Mother    He indicated that his mother is alive. He indicated that his father is deceased. He indicated that his paternal grandfather is deceased.  Social History    Social History   Socioeconomic History   Marital status: Single    Spouse name: Not on file   Number of children: Not on file   Years of education: Not on file   Highest education level: Not on file  Occupational History   Not on file  Tobacco Use   Smoking status: Never   Smokeless tobacco: Never  Vaping Use   Vaping Use: Never used   Substance and Sexual Activity   Alcohol use: No   Drug use: No   Sexual activity: Yes    Partners: Female  Other Topics Concern   Not on file  Social History Narrative   Not on file   Social Determinants of Health   Financial Resource Strain: Medium Risk (11/20/2017)   Overall Financial Resource Strain (CARDIA)    Difficulty of Paying Living Expenses: Somewhat hard  Food Insecurity: Unknown (11/20/2017)   Hunger Vital Sign    Worried About Running Out of Food in the Last Year: Patient refused    [place] in the Last Year: Patient refused  Transportation Needs: No Transportation Needs (11/20/2017)   PRAPARE - Hydrologist (Medical): No    Lack of Transportation (Non-Medical): No  Physical Activity: Unknown (11/20/2017)   Exercise Vital Sign    Days of Exercise per Week: Patient refused    Minutes of Exercise per Session: Patient refused  Stress: No Stress Concern Present (11/20/2017)   [place]    Feeling of Stress : Only a little  Social Connections: Unknown (11/20/2017)   Social Connection and Isolation Panel [NHANES]    Frequency of Communication with Friends and Family: More than three times a week    Frequency of Social Gatherings with Friends and Family: Twice a week    Attends Religious Services: Patient refused    Marine scientist or Organizations: No    Attends Archivist Meetings: Never    Marital Status: Never married  Intimate Partner Violence: Unknown (11/20/2017)   Humiliation, Afraid, Rape, and Kick questionnaire    Fear of Current or Ex-Partner: Patient refused    Emotionally Abused: Patient refused    Physically Abused: Patient refused    Sexually Abused: Patient refused     Review of Systems    General:  No chills, fever, night sweats or weight changes.  Cardiovascular:  No chest pain, dyspnea on exertion, edema, orthopnea, palpitations,  paroxysmal nocturnal dyspnea. Dermatological: No rash, lesions/masses Respiratory: No cough, dyspnea Urologic: No hematuria, dysuria Abdominal:   No nausea, vomiting, diarrhea, bright red blood per rectum, melena, or hematemesis Neurologic:  No visual changes, wkns, changes in mental status. All other systems reviewed and are otherwise negative except as noted above.     Physical Exam    VS:  There were no vitals taken for this visit. , BMI There is no height or weight on file to calculate BMI.     GEN: Well nourished, well developed, in no acute distress. HEENT: normal. Neck: Supple, no JVD, carotid bruits, or masses. Cardiac: RRR, no murmurs, rubs, or gallops. No clubbing, cyanosis, edema.  Radials/DP/PT 2+ and equal bilaterally.  Respiratory:  Respirations regular and unlabored, clear to auscultation bilaterally.  GI: Soft, nontender, nondistended, BS + x 4. MS: no deformity or atrophy. Skin: warm and dry, no rash. Neuro:  Strength and sensation are intact. Psych: Normal affect.  Accessory Clinical Findings    ECG personally reviewed by me today- *** - No acute changes  Lab Results  Component Value Date   WBC 6.8 06/24/2021   HGB 17.5 06/24/2021   HCT 47.2 06/24/2021   MCV 93 06/24/2021   PLT 217 06/24/2021   Lab Results  Component Value Date   CREATININE 1.74 (H) 11/23/2021   BUN 36 (H) 11/23/2021   NA 142 11/23/2021   K 4.4 11/23/2021   CL 96 11/23/2021   CO2 24 11/23/2021   Lab Results  Component Value Date   ALT 16 11/23/2021   AST 31 11/23/2021   ALKPHOS 104 11/23/2021   BILITOT 0.6 11/23/2021   Lab Results  Component Value Date   CHOL 186 11/23/2021   HDL 43 11/23/2021   LDLCALC 114 (H) 11/23/2021   TRIG 164 (H) 11/23/2021    Lab Results  Component Value Date   HGBA1C 6.2 11/23/2021    Review of Prior Studies: Echocardiogram 05/30/2020 Left ventricle: The cavity size was mildly dilated. Mildly    increased relative wall thickness. Systolic  function was    moderately to severely reduced. The estimated ejection fraction    was in the range of 30% to 35%. Moderate diffuse hypokinesis with    no identifiable regional variations. Acoustic contrast    opacification revealed no evidence ofthrombus.  - Left atrium: The atrium was moderately dilated.  - Right ventricle: Systolic function was mildly reduced.  - Right atrium: The atrium was mildly dilated.  - Pericardium, extracardiac: A trivial pericardial effusion was    identified.    Assessment & Plan   1.  ***    Current medicines are reviewed at length with the patient today.  I have spent *** min's  dedicated to the care of this patient on the date of this encounter to include pre-visit review of records, assessment, management and diagnostic testing,with shared decision making. Signed, Phill Myron. [place] Pugh, ANP, [place]   07/20/2022 12:39 PM      Office [phone] Fax [phone]  Notice: This dictation was prepared with Dragon dictation along with smaller phrase technology. Any transcriptional errors that result from this process are unintentional and may not be corrected upon review.

## 2022-07-21 ENCOUNTER — Ambulatory Visit: Payer: Medicaid Other | Attending: Adult Health | Admitting: Adult Health

## 2022-08-10 ENCOUNTER — Encounter: Payer: Self-pay | Admitting: Adult Health

## 2022-11-08 ENCOUNTER — Encounter: Payer: Self-pay | Admitting: Family Medicine

## 2022-11-08 ENCOUNTER — Telehealth (HOSPITAL_BASED_OUTPATIENT_CLINIC_OR_DEPARTMENT_OTHER): Payer: Medicaid Other | Admitting: Family Medicine

## 2022-11-08 DIAGNOSIS — E1169 Type 2 diabetes mellitus with other specified complication: Secondary | ICD-10-CM

## 2022-11-08 DIAGNOSIS — I110 Hypertensive heart disease with heart failure: Secondary | ICD-10-CM

## 2022-11-08 DIAGNOSIS — I5042 Chronic combined systolic (congestive) and diastolic (congestive) heart failure: Secondary | ICD-10-CM | POA: Diagnosis not present

## 2022-11-08 DIAGNOSIS — M1A0790 Idiopathic chronic gout, unspecified ankle and foot, without tophus (tophi): Secondary | ICD-10-CM | POA: Diagnosis not present

## 2022-11-08 DIAGNOSIS — I4891 Unspecified atrial fibrillation: Secondary | ICD-10-CM

## 2022-11-08 NOTE — Progress Notes (Signed)
Virtual Visit via Video Note  I connected with Joe Blake, on 11/08/2022 at 2:19 PM by video enabled telemedicine device and verified that I am speaking with the correct person using two identifiers.   Consent: I discussed the limitations, risks, security and privacy concerns of performing an evaluation and management service by telemedicine and the availability of in person appointments. I also discussed with the patient that there may be a patient responsible charge related to this service. The patient expressed understanding and agreed to proceed.   Location of Patient: Home  Location of Provider: Clinic   Persons participating in Telemedicine visit: Joe Blake Dr. Margarita Rana     History of Present Illness: Joe Blake is a 57 y.o. year old male  with a history of type 2 diabetes mellitus (A1c 6.2 ) hypertension, Gout, congestive heart failure (EF 30-35% from 05/2018), atrial fibrillation with RVR/atrial flutter status post DCCV on 03/2018 (currently on anticoagulation with Xarelto), obstructive sleep apnea.   Last office visit was 1 year ago but he endorses adherence with Wilder Glade for his diabetes.  His gout has also been stable with no flares.  He has dyspnea on moderate exertion but has no chest pain, palpitations, lightheadedness. He does have a little edema at follow-up.  Endorses adherence with his cardiac medications. He sees Cardiology next week. Denies presence of additional concerns today.  Past Medical History:  Diagnosis Date   Acute systolic HF (heart failure) (HCC)    Atrial fibrillation (HCC)    Diabetes mellitus without complication ([place])    Hiatal hernia    History of esophageal ulcer    History of gastric ulcer    Hypertension    Morbid obesity ([place])    No Known Allergies  Current Outpatient Medications on File Prior to Visit  Medication Sig Dispense Refill   allopurinol (ZYLOPRIM) 300 MG tablet TAKE 1 TABLET (300 MG TOTAL) BY MOUTH DAILY. 30 tablet 2    atorvastatin (LIPITOR) 40 MG tablet Take 1 tablet (40 mg total) by mouth daily. 90 tablet 1   colchicine (COLCRYS) 0.6 MG tablet TAKE 1 TABLET (0.6 MG TOTAL) BY MOUTH ONCE AS NEEDED (TAKE 1.'2MG'$  (2 tablets)AND THEN 0.70M (1 tablet) 1 HOUR LATER IF STILL HAVING SYMPTOMS). 30 tablet 2   dapagliflozin propanediol (FARXIGA) 5 MG TABS tablet Take 1 tablet (5 mg total) by mouth daily before breakfast. 30 tablet 6   doxycycline (VIBRA-TABS) 100 MG tablet Take 1 tablet (100 mg total) by mouth 2 (two) times daily. 20 tablet 0   furosemide (LASIX) 80 MG tablet TAKE 1 TABLETS BY MOUTH IN THE MORNING AND 1/2 TABLET IN THE EVENING 135 tablet 3   metoprolol succinate (TOPROL-XL) 100 MG 24 hr tablet Take 1 tablet (100 mg total) by mouth daily. Take with or immediately following a meal. 90 tablet 3   rivaroxaban (XARELTO) 20 MG TABS tablet TAKE 1 TABLET (20 MG TOTAL) BY MOUTH DAILY WITH SUPPER. 90 tablet 3   sacubitril-valsartan (ENTRESTO) 49-51 MG Take 1 tablet by mouth 2 (two) times daily. 180 tablet 3   No current facility-administered medications on file prior to visit.    ROS: See HPI  Observations/Objective: General: Alert, awake, oriented x 3 BP 128/80-vitals taken today at a grocery store Respiration: normal Cardiac: No JVD Psych: Normal mood     Latest Ref Rng & Units 11/23/2021    2:35 PM 06/24/2021    3:27 PM 04/11/2021    3:17 PM  CMP  Glucose 70 - 99 mg/dL 122  140  140   BUN 6 - 24 mg/dL 36  24  26   Creatinine 0.76 - 1.27 mg/dL 1.74  1.25  1.50   Sodium 134 - 144 mmol/L 142  141  139   Potassium 3.5 - 5.2 mmol/L 4.4  4.5  4.1   Chloride 96 - 106 mmol/L 96  99  97   CO2 20 - 29 mmol/L '24  24  25   '$ Calcium 8.7 - 10.2 mg/dL 10.3  9.9  10.0   Total Protein 6.0 - 8.5 g/dL 8.1   7.9   Total Bilirubin 0.0 - 1.2 mg/dL 0.6   1.2   Alkaline Phos 44 - 121 IU/L 104   167   AST 0 - 40 IU/L 31   18   ALT 0 - 44 IU/L 16   12     Lipid Panel     Component Value Date/Time   CHOL 186  11/23/2021 1435   TRIG 164 (H) 11/23/2021 1435   HDL 43 11/23/2021 1435   LDLCALC 114 (H) 11/23/2021 1435   LABVLDL 29 11/23/2021 1435    Lab Results  Component Value Date   HGBA1C 6.2 11/23/2021     Assessment and Plan: 1. Type 2 diabetes mellitus with other specified complication, without long-term current use of insulin (HCC) Last A1c 1 year ago was 6.2 He is due for an A1c which I have ordered and he will have this done next week Continue Farxiga Counseled on Diabetic diet, my plate method, [id number] minutes of moderate intensity exercise/week Blood sugar logs with fasting goals of 80-120 mg/dl, random of less than 180 and in the event of sugars less than 60 mg/dl or greater than 400 mg/dl encouraged to notify the clinic. Advised on the need for annual eye exams, annual foot exams, Pneumonia vaccine. - Microalbumin / creatinine urine ratio; Future - CMP14+EGFR; Future - Hemoglobin A1c; Future  2. Hypertensive heart disease with chronic combined systolic and diastolic congestive heart failure (HCC) EF of 30 to 35% from echo of 2019, NYHA II-III He is due for repeat echo Continue Entresto, SGLT2i, beta-blocker Keep upcoming appointment with cardiology next week - LP+Non-HDL Cholesterol; Future  3. Atrial fibrillation with RVR (HCC) Asymptomatic Currently on anticoagulation with Xarelto and rate control with beta-blocker  4.  Gout No recent flares Continue allopurinol for prophylaxis and colchicine for acute flares   Follow Up Instructions: 54-month  I discussed the assessment and treatment plan with the patient. The patient was provided an opportunity to ask questions and all were answered. The patient agreed with the plan and demonstrated an understanding of the instructions.   The patient was advised to call back or seek an in-person evaluation if the symptoms worsen or if the condition fails to improve as anticipated.     I provided 20 minutes total of Telehealth  time during this encounter including median intraservice time, reviewing previous notes, investigations, ordering medications, medical decision making, coordinating care and patient verbalized understanding at the end of the visit.     ECharlott Rakes MD, FAAFP. [facility]  11/08/2022, 2:19 PM

## 2022-11-13 NOTE — Progress Notes (Deleted)
Cardiology Clinic Note   Patient Name: Joe Blake Date of Encounter: 11/13/2022  Primary Care Provider:  Charlott Rakes, MD Primary Cardiologist:  Minus Breeding, MD  Patient Profile    Joe Blake 57 year old male presents the clinic today for follow-up evaluation of his chronic systolic CHF and persistent atrial fibrillation.  Past Medical History    Past Medical History:  Diagnosis Date   Acute systolic HF (heart failure) (HCC)    Atrial fibrillation (HCC)    Diabetes mellitus without complication ([place])    Hiatal hernia    History of esophageal ulcer    History of gastric ulcer    Hypertension    Morbid obesity ([place])    Past Surgical History:  Procedure Laterality Date   ABDOMINAL SURGERY     APPENDECTOMY     at 57 years old   BIOPSY  08/11/2018   Procedure: BIOPSY;  Surgeon: Laurence Spates, MD;  Location: [facility];  Service: Endoscopy;;   CARDIOVERSION N/A 03/18/2018   Procedure: CARDIOVERSION;  Surgeon: Skeet Latch, MD;  Location: [facility];  Service: Cardiovascular;  Laterality: N/A;   ESOPHAGOGASTRODUODENOSCOPY (EGD) WITH PROPOFOL N/A 08/11/2018   Procedure: ESOPHAGOGASTRODUODENOSCOPY (EGD) WITH PROPOFOL;  Surgeon: Laurence Spates, MD;  Location: [facility];  Service: Endoscopy;  Laterality: N/A;    Allergies  No Known Allergies  History of Present Illness    Joe Blake has a PMH of chronic systolic CHF, LBBB, persistent atrial fibrillation, HTN, sleep apnea, CKD stage III, morbid obesity, gout, and prediabetes. He was admitted to the hospital and found to have an EF of 20-25%.  He was admitted to [facility] in [place] in 2013.  During that time he was told that his pumping function was 33%.  He was felt to be a poor candidate for Myoview secondary to morbid obesity.  He underwent cardiac catheterization in 2019 and was found to have an EF of 30-35%.  He was seen in follow-up by Dr. Percival Spanish on 11/11/2021.  During his prior visit  he was transition to [place].  He had several canceled appointments prior to his follow-up.  He reported that he had been out of town in [place].  He did note that he had run out of some medications.  He denied new symptoms.  He had no new shortness of breath PND orthopnea.  He denied palpitations presyncope and syncope.  CHA2DS2-VASc score 3.  Atrial fibrillation-reports compliance with anticoagulation.  Denies bleeding issues.  Denies recent episodes of irregular or accelerated heartbeat. Continue current medical therapy Avoid triggers for palpitations caffeine, chocolate, EtOH, dehydration etc.  Chronic systolic CHF-euvolemic today.  Weight stable.  No increased work of breathing and/or activity intolerance.  Reports compliance with Entresto.  NYHA class II. Increase Entresto to 97/103 Continue Farxiga, furosemide, metoprolol Repeat BMP in 1-2 weeks And plan for repeat echocardiogram once GDMT has been optimized x 1 month  Essential hypertension-BP today***. Continue metoprolol, Entresto Heart healthy low-sodium diet Increase physical activity as tolerated  OSA-reports compliance with CPAP.  Waking up well rested. Continue CPAP use  Morbid obesity-weight today***. Calorie reduced diet Increase physical activity as tolerated Continue weight loss Follows with PCP  Disposition: Follow-up with Dr. Percival Spanish or me in 3-4 months.  Home Medications    Prior to Admission medications   Medication Sig Start Date End Date Taking? Authorizing Provider  allopurinol (ZYLOPRIM) 300 MG tablet TAKE 1 TABLET (300 MG TOTAL) BY MOUTH DAILY. 11/23/21 11/23/22  Newlin,  Enobong, MD  atorvastatin (LIPITOR) 40 MG tablet Take 1 tablet (40 mg total) by mouth daily. 11/25/21   Charlott Rakes, MD  colchicine (COLCRYS) 0.6 MG tablet TAKE 1 TABLET (0.6 MG TOTAL) BY MOUTH ONCE AS NEEDED (TAKE 1.'2MG'$  (2 tablets)AND THEN 0.61M (1 tablet) 1 HOUR LATER IF STILL HAVING SYMPTOMS). 11/23/21   Charlott Rakes, MD   dapagliflozin propanediol (FARXIGA) 5 MG TABS tablet Take 1 tablet (5 mg total) by mouth daily before breakfast. 11/23/21   Charlott Rakes, MD  doxycycline (VIBRA-TABS) 100 MG tablet Take 1 tablet (100 mg total) by mouth 2 (two) times daily. 11/23/21   Charlott Rakes, MD  furosemide (LASIX) 80 MG tablet TAKE 1 TABLETS BY MOUTH IN THE MORNING AND 1/2 TABLET IN THE EVENING 11/11/21 11/11/22  Minus Breeding, MD  metoprolol succinate (TOPROL-XL) 100 MG 24 hr tablet Take 1 tablet (100 mg total) by mouth daily. Take with or immediately following a meal. 03/08/21 05/17/22  Troy Sine, MD  rivaroxaban (XARELTO) 20 MG TABS tablet TAKE 1 TABLET (20 MG TOTAL) BY MOUTH DAILY WITH SUPPER. 11/11/21 11/11/22  Minus Breeding, MD  sacubitril-valsartan (ENTRESTO) 49-51 MG Take 1 tablet by mouth 2 (two) times daily. 11/11/21   Minus Breeding, MD    Family History    Family History  Problem Relation Age of Onset   Heart attack Paternal Grandfather    Hyperlipidemia Mother    Hypertension Mother    He indicated that his mother is alive. He indicated that his father is deceased. He indicated that his paternal grandfather is deceased.  Social History    Social History   Socioeconomic History   Marital status: Single    Spouse name: Not on file   Number of children: Not on file   Years of education: Not on file   Highest education level: Not on file  Occupational History   Not on file  Tobacco Use   Smoking status: Never   Smokeless tobacco: Never  Vaping Use   Vaping Use: Never used  Substance and Sexual Activity   Alcohol use: No   Drug use: No   Sexual activity: Yes    Partners: Female  Other Topics Concern   Not on file  Social History Narrative   Not on file   Social Determinants of Health   Financial Resource Strain: Medium Risk (11/20/2017)   Overall Financial Resource Strain (CARDIA)    Difficulty of Paying Living Expenses: Somewhat hard  Food Insecurity: Unknown (11/20/2017)    Hunger Vital Sign    Worried About Running Out of Food in the Last Year: Patient refused    [place] in the Last Year: Patient refused  Transportation Needs: No Transportation Needs (11/20/2017)   PRAPARE - Hydrologist (Medical): No    Lack of Transportation (Non-Medical): No  Physical Activity: Unknown (11/20/2017)   Exercise Vital Sign    Days of Exercise per Week: Patient refused    Minutes of Exercise per Session: Patient refused  Stress: No Stress Concern Present (11/20/2017)   [place]    Feeling of Stress : Only a little  Social Connections: Unknown (11/20/2017)   Social Connection and Isolation Panel [NHANES]    Frequency of Communication with Friends and Family: More than three times a week    Frequency of Social Gatherings with Friends and Family: Twice a week    Attends Religious Services: Patient refused  Active Member of Clubs or Organizations: No    Attends Archivist Meetings: Never    Marital Status: Never married  Intimate Partner Violence: Unknown (11/20/2017)   Humiliation, Afraid, Rape, and Kick questionnaire    Fear of Current or Ex-Partner: Patient refused    Emotionally Abused: Patient refused    Physically Abused: Patient refused    Sexually Abused: Patient refused     Review of Systems    General:  No chills, fever, night sweats or weight changes.  Cardiovascular:  No chest pain, dyspnea on exertion, edema, orthopnea, palpitations, paroxysmal nocturnal dyspnea. Dermatological: No rash, lesions/masses Respiratory: No cough, dyspnea Urologic: No hematuria, dysuria Abdominal:   No nausea, vomiting, diarrhea, bright red blood per rectum, melena, or hematemesis Neurologic:  No visual changes, wkns, changes in mental status. All other systems reviewed and are otherwise negative except as noted above.  Physical Exam    VS:  There were no vitals  taken for this visit. , BMI There is no height or weight on file to calculate BMI. GEN: Well nourished, well developed, in no acute distress. HEENT: normal. Neck: Supple, no JVD, carotid bruits, or masses. Cardiac: RRR, no murmurs, rubs, or gallops. No clubbing, cyanosis, edema.  Radials/DP/PT 2+ and equal bilaterally.  Respiratory:  Respirations regular and unlabored, clear to auscultation bilaterally. GI: Soft, nontender, nondistended, BS + x 4. MS: no deformity or atrophy. Skin: warm and dry, no rash. Neuro:  Strength and sensation are intact. Psych: Normal affect.  Accessory Clinical Findings    Recent Labs: 11/23/2021: ALT 16; BUN 36; Creatinine, Ser 1.74; Potassium 4.4; Sodium 142   Recent Lipid Panel    Component Value Date/Time   CHOL 186 11/23/2021 1435   TRIG 164 (H) 11/23/2021 1435   HDL 43 11/23/2021 1435   LDLCALC 114 (H) 11/23/2021 1435    No BP recorded.  {Refresh Note OR Click here to enter BP  :1}***    ECG personally reviewed by me today- *** - No acute changes  Echocardiogram 05/30/2018 Study Conclusions   - Left ventricle: The cavity size was mildly dilated. Mildly    increased relative wall thickness. Systolic function was    moderately to severely reduced. The estimated ejection fraction    was in the range of 30% to 35%. Moderate diffuse hypokinesis with    no identifiable regional variations. Acoustic contrast    opacification revealed no evidence ofthrombus.  - Left atrium: The atrium was moderately dilated.  - Right ventricle: Systolic function was mildly reduced.  - Right atrium: The atrium was mildly dilated.  - Pericardium, extracardiac: A trivial pericardial effusion was    identified.   Impressions:   - Compared to 11/20/2017, there is mild improvement in both right    and left ventricular systolic function.   -------------------------------------------------------------------  Study data:  Comparison was made to the study of  11/20/2017.  Study  status:  Routine.  Procedure:  The patient reported no pain pre or  post test. Transthoracic echocardiography. Image quality was  adequate.          Transthoracic echocardiography.  M-mode,  complete 2D, spectral Doppler, and color Doppler.  Birthdate:  Patient birthdate: May 24, 1966.  Age:  Patient is 57 yr old.  Sex:  Gender: male.    BMI: 47.6 kg/m^2.  Blood pressure:     167/97  Patient status:  Outpatient.  Study date:  Study date: 05/30/2018.  Study time: 02:11 PM.  Location:  [facility]   -------------------------------------------------------------------   -------------------------------------------------------------------  Left ventricle:  The cavity size was mildly dilated.  Mildly  increased relative wall thickness. Systolic function was moderately  to severely reduced. The estimated ejection fraction was in the  range of 30% to 35%.  Moderate diffuse hypokinesis with no  identifiable regional variations.  Acoustic contrast opacification  revealed no evidence ofthrombus. The study was not technically  sufficient to allow evaluation of LV diastolic dysfunction due to  atrial fibrillation.   -------------------------------------------------------------------  Aortic valve:   Trileaflet; mildly thickened, mildly calcified  leaflets. Sclerosis without stenosis.   -------------------------------------------------------------------  Aorta: Aortic root: The aortic root was normal in size.  Ascending aorta: The ascending aorta was normal in size.   -------------------------------------------------------------------  Mitral valve:   Mildly thickened, mildly calcified leaflets .  Leaflet separation was normal.  Doppler:  Transvalvular velocity  was within the normal range. There was no evidence for stenosis.  There was no regurgitation.    Peak gradient (D): 7 mm Hg.   -------------------------------------------------------------------  Left atrium:   The atrium was moderately dilated.   -------------------------------------------------------------------  Right ventricle:  The cavity size was at the upper limits of  normal. Systolic function was mildly reduced.   -------------------------------------------------------------------  Pulmonic valve:    Structurally normal valve.   Cusp separation was  normal.  Doppler:  Transvalvular velocity was within the normal  range. There was no regurgitation.   -------------------------------------------------------------------  Pulmonary artery:    Systolic pressure could not be accurately  estimated.   -------------------------------------------------------------------  Right atrium:  The atrium was mildly dilated.   -------------------------------------------------------------------  Pericardium: A trivial pericardial effusion was identified.   -------------------------------------------------------------------  Systemic veins:  Inferior vena cava: The vessel was dilated. The respirophasic  diameter changes were blunted (< 50%), consistent with elevated  central venous pressure. Diameter: 27 mm.     Assessment & Plan   1.  ***   Jossie Ng. Evangelina Delancey NP-C     11/13/2022, 6:57 AM [place] [address] Office [phone] Fax [phone]    I spent***minutes examining this patient, reviewing medications, and using patient centered shared decision making involving her cardiac care.  Prior to her visit I spent greater than 20 minutes reviewing her past medical history,  medications, and prior cardiac tests.

## 2022-11-14 ENCOUNTER — Encounter: Payer: Self-pay | Admitting: Family Medicine

## 2022-11-14 ENCOUNTER — Other Ambulatory Visit: Payer: Self-pay | Admitting: Family Medicine

## 2022-11-14 DIAGNOSIS — Z130 Encounter for screening for diseases of the blood and blood-forming organs and certain disorders involving the immune mechanism: Secondary | ICD-10-CM

## 2022-11-15 ENCOUNTER — Other Ambulatory Visit: Payer: Medicaid Other

## 2022-11-15 ENCOUNTER — Ambulatory Visit: Payer: Medicaid Other | Admitting: General Practice

## 2022-11-17 ENCOUNTER — Other Ambulatory Visit: Payer: Self-pay | Admitting: Cardiovascular Disease

## 2022-11-17 ENCOUNTER — Other Ambulatory Visit: Payer: Self-pay

## 2022-11-17 ENCOUNTER — Other Ambulatory Visit: Payer: Self-pay | Admitting: Cardiology

## 2022-11-17 MED ORDER — ENTRESTO 49-51 MG PO TABS
1.0000 | ORAL_TABLET | Freq: Two times a day (BID) | ORAL | 0 refills | Status: DC
  Filled 2022-11-17: qty 60, 30d supply, fill #0

## 2022-11-17 MED ORDER — METOPROLOL SUCCINATE ER 100 MG PO TB24
100.0000 mg | ORAL_TABLET | Freq: Every day | ORAL | 0 refills | Status: DC
  Filled 2022-11-17: qty 30, 30d supply, fill #0

## 2022-11-17 MED ORDER — FUROSEMIDE 80 MG PO TABS
ORAL_TABLET | ORAL | 0 refills | Status: DC
  Filled 2022-11-17: qty 45, 30d supply, fill #0

## 2022-11-17 MED ORDER — RIVAROXABAN 20 MG PO TABS
20.0000 mg | ORAL_TABLET | Freq: Every day | ORAL | 0 refills | Status: DC
  Filled 2022-11-17: qty 30, 30d supply, fill #0

## 2022-11-20 ENCOUNTER — Ambulatory Visit: Payer: Medicaid Other | Attending: Family Medicine

## 2022-11-20 DIAGNOSIS — I110 Hypertensive heart disease with heart failure: Secondary | ICD-10-CM

## 2022-11-20 DIAGNOSIS — E1169 Type 2 diabetes mellitus with other specified complication: Secondary | ICD-10-CM

## 2022-11-20 DIAGNOSIS — Z13228 Encounter for screening for other metabolic disorders: Secondary | ICD-10-CM | POA: Diagnosis not present

## 2022-11-20 DIAGNOSIS — I5042 Chronic combined systolic (congestive) and diastolic (congestive) heart failure: Secondary | ICD-10-CM | POA: Diagnosis not present

## 2022-11-20 DIAGNOSIS — Z1329 Encounter for screening for other suspected endocrine disorder: Secondary | ICD-10-CM | POA: Diagnosis not present

## 2022-11-20 DIAGNOSIS — Z130 Encounter for screening for diseases of the blood and blood-forming organs and certain disorders involving the immune mechanism: Secondary | ICD-10-CM | POA: Diagnosis not present

## 2022-11-20 NOTE — Progress Notes (Unsigned)
Cardiology Clinic Note   Patient Name: Joe Blake Date of Encounter: 11/21/2022  Primary Care Provider:  Charlott Rakes, MD Primary Cardiologist:  Minus Breeding, MD  Patient Profile    Joe Blake 57 year old male presents the clinic today for follow-up evaluation of his chronic systolic CHF and persistent atrial fibrillation.  Past Medical History    Past Medical History:  Diagnosis Date   Acute systolic HF (heart failure) (HCC)    Atrial fibrillation (HCC)    Diabetes mellitus without complication ([facility])    Hiatal hernia    History of esophageal ulcer    History of gastric ulcer    Hypertension    Morbid obesity ([place])    Past Surgical History:  Procedure Laterality Date   ABDOMINAL SURGERY     APPENDECTOMY     at 57 years old   BIOPSY  08/11/2018   Procedure: BIOPSY;  Surgeon: Laurence Spates, MD;  Location: [facility];  Service: Endoscopy;;   CARDIOVERSION N/A 03/18/2018   Procedure: CARDIOVERSION;  Surgeon: Skeet Latch, MD;  Location: [facility];  Service: Cardiovascular;  Laterality: N/A;   ESOPHAGOGASTRODUODENOSCOPY (EGD) WITH PROPOFOL N/A 08/11/2018   Procedure: ESOPHAGOGASTRODUODENOSCOPY (EGD) WITH PROPOFOL;  Surgeon: Laurence Spates, MD;  Location: [facility];  Service: Endoscopy;  Laterality: N/A;    Allergies  No Known Allergies  History of Present Illness    Joe Blake has a PMH of chronic systolic CHF, LBBB, persistent atrial fibrillation, HTN, sleep apnea, CKD stage III, morbid obesity, gout, and prediabetes.  He was admitted to the hospital and found to have an EF of 20-25%.  He was admitted to [facility] in [place] in 2013.  During that time he was told that his pumping function was 33%.  He was felt to be a poor candidate for Myoview secondary to morbid obesity.  He underwent cardiac catheterization in 2019 and was found to have an EF of 30-35%.  He was seen in follow-up by Dr. Percival Spanish on 11/11/2021.  During his prior  visit he was transition to [place].  He had several canceled appointments prior to his follow-up.  He reported that he had been out of town in [place].  He did note that he had run out of some medications.  He denied new symptoms.  He had no new shortness of breath PND orthopnea.  He denied palpitations presyncope and syncope.  CHA2DS2-VASc score 3.  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  He reports that he has not taken his medications for 1 week.  He plans to pick them up at the pharmacy today.  He went to pick up his medications yesterday but left due to congested parking.  We reviewed his recent lab work with his PCP.  He does not wish to take cholesterol-lowering medication.  We reviewed the importance of medication compliance and high-fiber diet.  He expressed understanding.  He reports that when he is taking his medications his blood pressure is in the 120s over 80s.  I will give him the salty 6 diet sheet, high-fiber diet instructions, have him maintain his physical activity and plan follow-up in 6 months.  Today he denies chest pain, shortness of breath, and lower extremity edema.  Home Medications    Prior to Admission medications   Medication Sig Start Date End Date Taking? Authorizing Provider  allopurinol (ZYLOPRIM) 300 MG  tablet TAKE 1 TABLET (300 MG TOTAL) BY MOUTH DAILY. 11/23/21 11/23/22  Charlott Rakes, MD  atorvastatin (LIPITOR) 40 MG tablet Take 1 tablet (40 mg total) by mouth daily. 11/25/21   Charlott Rakes, MD  colchicine (COLCRYS) 0.6 MG tablet TAKE 1 TABLET (0.6 MG TOTAL) BY MOUTH ONCE AS NEEDED (TAKE 1.2MG  (2 tablets)AND THEN 0.68M (1 tablet) 1 HOUR LATER IF STILL HAVING SYMPTOMS). 11/23/21   Charlott Rakes, MD  dapagliflozin propanediol (FARXIGA) 5 MG TABS tablet Take 1 tablet (5 mg total) by mouth daily before breakfast. 11/23/21   Charlott Rakes, MD   doxycycline (VIBRA-TABS) 100 MG tablet Take 1 tablet (100 mg total) by mouth 2 (two) times daily. 11/23/21   Charlott Rakes, MD  furosemide (LASIX) 80 MG tablet TAKE 1 TABLETS BY MOUTH IN THE MORNING AND 1/2 TABLET IN THE EVENING 11/11/21 11/11/22  Minus Breeding, MD  metoprolol succinate (TOPROL-XL) 100 MG 24 hr tablet Take 1 tablet (100 mg total) by mouth daily. Take with or immediately following a meal. 03/08/21 05/17/22  Troy Sine, MD  rivaroxaban (XARELTO) 20 MG TABS tablet TAKE 1 TABLET (20 MG TOTAL) BY MOUTH DAILY WITH SUPPER. 11/11/21 11/11/22  Minus Breeding, MD  sacubitril-valsartan (ENTRESTO) 49-51 MG Take 1 tablet by mouth 2 (two) times daily. 11/11/21   Minus Breeding, MD    Family History    Family History  Problem Relation Age of Onset   Heart attack Paternal Grandfather    Hyperlipidemia Mother    Hypertension Mother    He indicated that his mother is alive. He indicated that his father is deceased. He indicated that his paternal grandfather is deceased.  Social History    Social History   Socioeconomic History   Marital status: Single    Spouse name: Not on file   Number of children: Not on file   Years of education: Not on file   Highest education level: Not on file  Occupational History   Not on file  Tobacco Use   Smoking status: Never   Smokeless tobacco: Never  Vaping Use   Vaping Use: Never used  Substance and Sexual Activity   Alcohol use: No   Drug use: No   Sexual activity: Yes    Partners: Female  Other Topics Concern   Not on file  Social History Narrative   Not on file   Social Determinants of Health   Financial Resource Strain: Medium Risk (11/20/2017)   Overall Financial Resource Strain (CARDIA)    Difficulty of Paying Living Expenses: Somewhat hard  Food Insecurity: Unknown (11/20/2017)   Hunger Vital Sign    Worried About Running Out of Food in the Last Year: Patient declined    [place] in the Last Year: Patient declined   Transportation Needs: No Transportation Needs (11/20/2017)   PRAPARE - Hydrologist (Medical): No    Lack of Transportation (Non-Medical): No  Physical Activity: Unknown (11/20/2017)   Exercise Vital Sign    Days of Exercise per Week: Patient declined    Minutes of Exercise per Session: Patient declined  Stress: No Stress Concern Present (11/20/2017)   [place]    Feeling of Stress : Only a little  Social Connections: Unknown (11/20/2017)   Social Connection and Isolation Panel [NHANES]    Frequency of Communication with Friends and Family: More than three times a week    Frequency of Social Gatherings with Friends  and Family: Twice a week    Attends Religious Services: Patient declined    Active Member of Clubs or Organizations: No    Attends Archivist Meetings: Never    Marital Status: Never married  Intimate Partner Violence: Unknown (11/20/2017)   Humiliation, Afraid, Rape, and Kick questionnaire    Fear of Current or Ex-Partner: Patient declined    Emotionally Abused: Patient declined    Physically Abused: Patient declined    Sexually Abused: Patient declined     Review of Systems    General:  No chills, fever, night sweats or weight changes.  Cardiovascular:  No chest pain, dyspnea on exertion, edema, orthopnea, palpitations, paroxysmal nocturnal dyspnea. Dermatological: No rash, lesions/masses Respiratory: No cough, dyspnea Urologic: No hematuria, dysuria Abdominal:   No nausea, vomiting, diarrhea, bright red blood per rectum, melena, or hematemesis Neurologic:  No visual changes, wkns, changes in mental status. All other systems reviewed and are otherwise negative except as noted above.  Physical Exam    VS:  BP (!) 156/88   Pulse 77   Ht 6\' 1"  (1.854 m)   Wt (!) 360 lb 6.4 oz (163.5 kg)   BMI 47.55 kg/m  , BMI Body mass index is 47.55 kg/m. GEN: Well  nourished, well developed, in no acute distress. HEENT: normal. Neck: Supple, no JVD, carotid bruits, or masses. Cardiac: RRR, no murmurs, rubs, or gallops. No clubbing, cyanosis, edema.  Radials/DP/PT 2+ and equal bilaterally.  Respiratory:  Respirations regular and unlabored, clear to auscultation bilaterally. GI: Soft, nontender, nondistended, BS + x 4. MS: no deformity or atrophy. Skin: warm and dry, no rash. Neuro:  Strength and sensation are intact. Psych: Normal affect.  Accessory Clinical Findings    Recent Labs: 11/20/2022: ALT 35; BUN 17; Creatinine, Ser 1.41; Potassium 4.2; Sodium 141   Recent Lipid Panel    Component Value Date/Time   CHOL 260 (H) 11/20/2022 1617   TRIG 347 (H) 11/20/2022 1617   HDL 42 11/20/2022 1617   LDLCALC 153 (H) 11/20/2022 1617    HYPERTENSION CONTROL Vitals:   11/21/22 0821 11/21/22 0835  BP: (!) 172/94 (!) 156/88    The patient's blood pressure is elevated above target today.  In order to address the patient's elevated BP: Blood pressure will be monitored at home to determine if medication changes need to be made.; A current anti-hypertensive medication was adjusted today.       ECG personally reviewed by me today-atrial fibrillation with premature aberrantly conducted complexes right bundle branch block 77 bpm- No acute changes  Echocardiogram 05/30/2018 Study Conclusions   - Left ventricle: The cavity size was mildly dilated. Mildly    increased relative wall thickness. Systolic function was    moderately to severely reduced. The estimated ejection fraction    was in the range of 30% to 35%. Moderate diffuse hypokinesis with    no identifiable regional variations. Acoustic contrast    opacification revealed no evidence ofthrombus.  - Left atrium: The atrium was moderately dilated.  - Right ventricle: Systolic function was mildly reduced.  - Right atrium: The atrium was mildly dilated.  - Pericardium, extracardiac: A trivial  pericardial effusion was    identified.   Impressions:   - Compared to 11/20/2017, there is mild improvement in both right    and left ventricular systolic function.   -------------------------------------------------------------------  Study data:  Comparison was made to the study of 11/20/2017.  Study  status:  Routine.  Procedure:  The patient  reported no pain pre or  post test. Transthoracic echocardiography. Image quality was  adequate.          Transthoracic echocardiography.  M-mode,  complete 2D, spectral Doppler, and color Doppler.  Birthdate:  Patient birthdate: [birth year].  Age:  Patient is 57 yr old.  Sex:  Gender: male.    BMI: 47.6 kg/m^2.  Blood pressure:     167/97  Patient status:  Outpatient.  Study date:  Study date: 05/30/2018.  Study time: 02:11 PM.  Location:  [facility]   -------------------------------------------------------------------   -------------------------------------------------------------------  Left ventricle:  The cavity size was mildly dilated.  Mildly  increased relative wall thickness. Systolic function was moderately  to severely reduced. The estimated ejection fraction was in the  range of 30% to 35%.  Moderate diffuse hypokinesis with no  identifiable regional variations.  Acoustic contrast opacification  revealed no evidence ofthrombus. The study was not technically  sufficient to allow evaluation of LV diastolic dysfunction due to  atrial fibrillation.   -------------------------------------------------------------------  Aortic valve:   Trileaflet; mildly thickened, mildly calcified  leaflets. Sclerosis without stenosis.   -------------------------------------------------------------------  Aorta: Aortic root: The aortic root was normal in size.  Ascending aorta: The ascending aorta was normal in size.   -------------------------------------------------------------------  Mitral valve:   Mildly thickened, mildly calcified  leaflets .  Leaflet separation was normal.  Doppler:  Transvalvular velocity  was within the normal range. There was no evidence for stenosis.  There was no regurgitation.    Peak gradient (D): 7 mm Hg.   -------------------------------------------------------------------  Left atrium:  The atrium was moderately dilated.   -------------------------------------------------------------------  Right ventricle:  The cavity size was at the upper limits of  normal. Systolic function was mildly reduced.   -------------------------------------------------------------------  Pulmonic valve:    Structurally normal valve.   Cusp separation was  normal.  Doppler:  Transvalvular velocity was within the normal  range. There was no regurgitation.   -------------------------------------------------------------------  Pulmonary artery:    Systolic pressure could not be accurately  estimated.   -------------------------------------------------------------------  Right atrium:  The atrium was mildly dilated.   -------------------------------------------------------------------  Pericardium: A trivial pericardial effusion was identified.   -------------------------------------------------------------------  Systemic veins:  Inferior vena cava: The vessel was dilated. The respirophasic  diameter changes were blunted (< 50%), consistent with elevated  central venous pressure. Diameter: 27 mm.     Assessment & Plan   1.  Atrial fibrillation-reports compliance with anticoagulation.  Denies bleeding issues.  Denies recent episodes of irregular or accelerated heartbeat. Continue current medical therapy  Chronic systolic CHF-euvolemic today.  Weight stable.  No increased work of breathing and/or activity intolerance.  Has not taken blood pressure medications in 1 week.  NYHA class I- II. Continue Farxiga, furosemide, metoprolol, Entresto  Essential hypertension-BP today 156/88. Well controlled at  home. Did not take medications for the last week.  Will present to pharmacy today to pick up prescriptions Continue metoprolol, Entresto Heart healthy low-sodium diet Increase physical activity as tolerated  OSA-reports compliance with CPAP.  Waking up well rested. Continue CPAP use  Morbid obesity-weight today360.4. Calorie reduced diet Increase physical activity as tolerated Continue weight loss Follows with PCP  Disposition: Follow-up with Dr. Percival Spanish or me in 6 months.   Jossie Ng. Zaryan Yakubov NP-C     11/21/2022, 8:44 AM [place] [address] Office [phone] Fax [phone]    I spent 14 minutes examining this patient, reviewing  medications, and using patient centered shared decision making involving her cardiac care.  Prior to her visit I spent greater than 20 minutes reviewing her past medical history,  medications, and prior cardiac tests.

## 2022-11-21 ENCOUNTER — Encounter: Payer: Self-pay | Admitting: General Practice

## 2022-11-21 ENCOUNTER — Other Ambulatory Visit: Payer: Self-pay | Admitting: Family Medicine

## 2022-11-21 ENCOUNTER — Other Ambulatory Visit: Payer: Self-pay

## 2022-11-21 ENCOUNTER — Ambulatory Visit: Payer: Medicaid Other | Attending: General Practice | Admitting: General Practice

## 2022-11-21 VITALS — BP 156/88 | HR 77 | Ht 73.0 in | Wt 360.4 lb

## 2022-11-21 DIAGNOSIS — I10 Essential (primary) hypertension: Secondary | ICD-10-CM | POA: Diagnosis not present

## 2022-11-21 DIAGNOSIS — E1169 Type 2 diabetes mellitus with other specified complication: Secondary | ICD-10-CM

## 2022-11-21 DIAGNOSIS — G4730 Sleep apnea, unspecified: Secondary | ICD-10-CM

## 2022-11-21 DIAGNOSIS — I5022 Chronic systolic (congestive) heart failure: Secondary | ICD-10-CM

## 2022-11-21 DIAGNOSIS — I4891 Unspecified atrial fibrillation: Secondary | ICD-10-CM

## 2022-11-21 LAB — LP+NON-HDL CHOLESTEROL
Cholesterol, Total: 260 mg/dL — ABNORMAL HIGH (ref 100–199)
HDL: 42 mg/dL (ref >39)
LDL Chol Calc (NIH): 153 mg/dL — ABNORMAL HIGH (ref 0–99)
Total Non-HDL-Chol (LDL+VLDL): 218 mg/dL — ABNORMAL HIGH (ref 0–129)
Triglycerides: 347 mg/dL — ABNORMAL HIGH (ref 0–149)
VLDL Cholesterol Cal: 65 mg/dL — ABNORMAL HIGH (ref 5–40)

## 2022-11-21 LAB — CMP14+EGFR
ALT: 35 IU/L (ref 0–44)
AST: 44 IU/L — ABNORMAL HIGH (ref 0–40)
Albumin/Globulin Ratio: 1.3 (ref 1.2–2.2)
Albumin: 4.5 g/dL (ref 3.8–4.9)
Alkaline Phosphatase: 116 IU/L (ref 44–121)
BUN/Creatinine Ratio: 12 (ref 9–20)
BUN: 17 mg/dL (ref 6–24)
Bilirubin Total: 0.7 mg/dL (ref 0.0–1.2)
CO2: 22 mmol/L (ref 20–29)
Calcium: 9.6 mg/dL (ref 8.7–10.2)
Chloride: 101 mmol/L (ref 96–106)
Creatinine, Ser: 1.41 mg/dL — ABNORMAL HIGH (ref 0.76–1.27)
Globulin, Total: 3.6 g/dL (ref 1.5–4.5)
Glucose: 119 mg/dL — ABNORMAL HIGH (ref 70–99)
Potassium: 4.2 mmol/L (ref 3.5–5.2)
Sodium: 141 mmol/L (ref 134–144)
Total Protein: 8.1 g/dL (ref 6.0–8.5)
eGFR: 59 mL/min/{1.73_m2} — ABNORMAL LOW (ref >59)

## 2022-11-21 LAB — HEMOGLOBIN A1C
Est. average glucose Bld gHb Est-mCnc: 126 mg/dL
Hgb A1c MFr Bld: 6 % — ABNORMAL HIGH (ref 4.8–5.6)

## 2022-11-21 LAB — MICROALBUMIN / CREATININE URINE RATIO
Creatinine, Urine: 360.4 mg/dL
Microalb/Creat Ratio: 125 mg/g creat — ABNORMAL HIGH (ref 0–29)
Microalbumin, Urine: 452.1 ug/mL

## 2022-11-21 LAB — TESTOSTERONE, FREE, TOTAL, SHBG
Sex Hormone Binding: 66.9 nmol/L (ref 19.3–76.4)
Testosterone, Free: 6.5 pg/mL — ABNORMAL LOW (ref 7.2–24.0)
Testosterone: 330 ng/dL (ref 264–916)

## 2022-11-21 MED ORDER — METOPROLOL SUCCINATE ER 100 MG PO TB24
100.0000 mg | ORAL_TABLET | Freq: Every day | ORAL | 3 refills | Status: DC
  Filled 2022-11-21 – 2022-12-18 (×2): qty 30, 30d supply, fill #0
  Filled 2022-12-19: qty 90, 90d supply, fill #0
  Filled 2023-03-15: qty 90, 90d supply, fill #1
  Filled 2023-06-13: qty 90, 90d supply, fill #2
  Filled 2023-10-05: qty 90, 90d supply, fill #0

## 2022-11-21 MED ORDER — FUROSEMIDE 80 MG PO TABS
ORAL_TABLET | ORAL | 6 refills | Status: DC
  Filled 2022-11-21 – 2022-12-18 (×2): qty 135, 90d supply, fill #0
  Filled 2022-12-19 – 2023-03-15 (×2): qty 135, 90d supply, fill #1
  Filled 2023-06-13: qty 135, 90d supply, fill #2

## 2022-11-21 MED ORDER — ENTRESTO 49-51 MG PO TABS
1.0000 | ORAL_TABLET | Freq: Two times a day (BID) | ORAL | 6 refills | Status: DC
  Filled 2022-11-21 – 2023-03-15 (×3): qty 60, 30d supply, fill #0
  Filled 2023-04-19: qty 60, 30d supply, fill #1

## 2022-11-21 MED ORDER — ATORVASTATIN CALCIUM 80 MG PO TABS
80.0000 mg | ORAL_TABLET | Freq: Every day | ORAL | 1 refills | Status: DC
  Filled 2022-11-21 – 2022-12-18 (×2): qty 90, 90d supply, fill #0
  Filled 2023-03-15: qty 90, 90d supply, fill #1

## 2022-11-21 MED ORDER — RIVAROXABAN 20 MG PO TABS
20.0000 mg | ORAL_TABLET | Freq: Every day | ORAL | 3 refills | Status: DC
  Filled 2022-11-21 – 2022-12-18 (×2): qty 30, 30d supply, fill #0
  Filled 2022-12-19: qty 90, 90d supply, fill #0
  Filled 2023-03-15: qty 90, 90d supply, fill #1
  Filled 2023-06-13: qty 90, 90d supply, fill #2
  Filled 2023-10-05: qty 90, 90d supply, fill #0

## 2022-11-21 NOTE — Patient Instructions (Signed)
Medication Instructions:  The current medical regimen is effective;  continue present plan and medications as directed. Please refer to the Current Medication list given to you today.  *If you need a refill on your cardiac medications before your next appointment, please call your pharmacy*  Lab Work: NONE If you have labs (blood work) drawn today and your tests are completely normal, you will receive your results only by:  [place] (if you have MyChart) OR A paper copy in the mail If you have any lab test that is abnormal or we need to change your treatment, we will call you to review the results.  Other Instructions TAKE AND LOG YOUR BLOOD PRESSURE  PLEASE READ ATTACHED FIBER DIET  Follow-Up: At [facility], you and your health needs are our priority.  As part of our continuing mission to provide you with exceptional heart care, we have created designated Provider Care Teams.  These Care Teams include your primary Cardiologist (physician) and Advanced Practice Providers (APPs -  Physician Assistants and Nurse Practitioners) who all work together to provide you with the care you need, when you need it.  Your next appointment:   6 month(s)  Provider:   Minus Breeding, MD        High-Fiber Eating Plan Fiber, also called dietary fiber, is a type of carbohydrate. It is found foods such as fruits, vegetables, whole grains, and beans. A high-fiber diet can have many health benefits. Your health care provider may recommend a high-fiber diet to help: Prevent constipation. Fiber can make your bowel movements more regular. Lower your cholesterol. Relieve the following conditions: Inflammation of veins in the anus (hemorrhoids). Inflammation of specific areas of the digestive tract (uncomplicated diverticulosis). A problem of the large intestine, also called the colon, that sometimes causes pain and diarrhea (irritable bowel syndrome, or IBS). Prevent overeating as part of a  weight-loss plan. Prevent heart disease, type 2 diabetes, and certain cancers. What are tips for following this plan? Reading food labels  Check the nutrition facts label on food products for the amount of dietary fiber. Choose foods that have 5 grams of fiber or more per serving. The goals for recommended daily fiber intake include: Men (age 103 or younger): 34-38 g. Men (over age 32): 28-34 g. Women (age 25 or younger): 25-28 g. Women (over age 82): 22-25 g. Your daily fiber goal is _____________ g. Shopping Choose whole fruits and vegetables instead of processed forms, such as apple juice or applesauce. Choose a wide variety of high-fiber foods such as avocados, lentils, oats, and kidney beans. Read the nutrition facts label of the foods you choose. Be aware of foods with added fiber. These foods often have high sugar and sodium amounts per serving. Cooking Use whole-grain flour for baking and cooking. Cook with brown rice instead of white rice. Meal planning Start the day with a breakfast that is high in fiber, such as a cereal that contains 5 g of fiber or more per serving. Eat breads and cereals that are made with whole-grain flour instead of refined flour or white flour. Eat brown rice, bulgur wheat, or millet instead of white rice. Use beans in place of meat in soups, salads, and pasta dishes. Be sure that half of the grains you eat each day are whole grains. General information You can get the recommended daily intake of dietary fiber by: Eating a variety of fruits, vegetables, grains, nuts, and beans. Taking a fiber supplement if you are not  able to take in enough fiber in your diet. It is better to get fiber through food than from a supplement. Gradually increase how much fiber you consume. If you increase your intake of dietary fiber too quickly, you may have bloating, cramping, or gas. Drink plenty of water to help you digest fiber. Choose high-fiber snacks, such as  berries, raw vegetables, nuts, and popcorn. What foods should I eat? Fruits Berries. Pears. Apples. Oranges. Avocado. Prunes and raisins. Dried figs. Vegetables Sweet potatoes. Spinach. Kale. Artichokes. Cabbage. Broccoli. Cauliflower. Green peas. Carrots. Squash. Grains Whole-grain breads. Multigrain cereal. Oats and oatmeal. Brown rice. Barley. Bulgur wheat. [place]. Quinoa. Bran muffins. Popcorn. Rye wafer crackers. Meats and other proteins Navy beans, kidney beans, and pinto beans. Soybeans. Split peas. Lentils. Nuts and seeds. Dairy Fiber-fortified yogurt. Beverages Fiber-fortified soy milk. Fiber-fortified orange juice. Other foods Fiber bars. The items listed above may not be a complete list of recommended foods and beverages. Contact a dietitian for more information. What foods should I avoid? Fruits Fruit juice. Cooked, strained fruit. Vegetables Fried potatoes. Canned vegetables. Well-cooked vegetables. Grains White bread. Pasta made with refined flour. White rice. Meats and other proteins Fatty cuts of meat. Fried chicken or fried fish. Dairy Milk. Yogurt. Cream cheese. Sour cream. Fats and oils Butters. Beverages Soft drinks. Other foods Cakes and pastries. The items listed above may not be a complete list of foods and beverages to avoid. Talk with your dietitian about what choices are best for you. Summary Fiber is a type of carbohydrate. It is found in foods such as fruits, vegetables, whole grains, and beans. A high-fiber diet has many benefits. It can help to prevent constipation, lower blood cholesterol, aid weight loss, and reduce your risk of heart disease, diabetes, and certain cancers. Increase your intake of fiber gradually. Increasing fiber too quickly may cause cramping, bloating, and gas. Drink plenty of water while you increase the amount of fiber you consume. The best sources of fiber include whole fruits and vegetables, whole grains, nuts, seeds, and  beans. This information is not intended to replace advice given to you by your health care provider. Make sure you discuss any questions you have with your health care provider. Document Revised: 12/25/2019 Document Reviewed: 12/25/2019 Elsevier Patient Education  [place].

## 2022-11-22 ENCOUNTER — Other Ambulatory Visit: Payer: Self-pay

## 2022-11-23 ENCOUNTER — Encounter: Payer: Self-pay | Admitting: Family Medicine

## 2022-11-28 ENCOUNTER — Other Ambulatory Visit: Payer: Self-pay

## 2022-12-18 ENCOUNTER — Encounter: Payer: Self-pay | Admitting: Cardiology

## 2022-12-18 ENCOUNTER — Other Ambulatory Visit: Payer: Self-pay

## 2022-12-18 ENCOUNTER — Other Ambulatory Visit: Payer: Self-pay | Admitting: Family Medicine

## 2022-12-18 DIAGNOSIS — E1169 Type 2 diabetes mellitus with other specified complication: Secondary | ICD-10-CM

## 2022-12-18 DIAGNOSIS — M1A0790 Idiopathic chronic gout, unspecified ankle and foot, without tophus (tophi): Secondary | ICD-10-CM

## 2022-12-18 MED ORDER — DAPAGLIFLOZIN PROPANEDIOL 5 MG PO TABS
5.0000 mg | ORAL_TABLET | Freq: Every day | ORAL | 0 refills | Status: DC
Start: 2022-12-18 — End: 2023-02-14
  Filled 2022-12-18: qty 90, 90d supply, fill #0

## 2022-12-18 MED ORDER — COLCHICINE 0.6 MG PO TABS
ORAL_TABLET | ORAL | 0 refills | Status: DC
  Filled 2022-12-18: qty 30, 30d supply, fill #0

## 2022-12-18 MED ORDER — ALLOPURINOL 300 MG PO TABS
300.0000 mg | ORAL_TABLET | Freq: Every day | ORAL | 0 refills | Status: DC
  Filled 2022-12-18: qty 90, 90d supply, fill #0

## 2022-12-19 ENCOUNTER — Other Ambulatory Visit: Payer: Self-pay

## 2023-02-05 ENCOUNTER — Ambulatory Visit: Payer: Self-pay | Admitting: *Deleted

## 2023-02-05 ENCOUNTER — Other Ambulatory Visit: Payer: Self-pay

## 2023-02-05 MED ORDER — PREDNISOLONE ACETATE 1 % OP SUSP
OPHTHALMIC | 5 refills | Status: DC
  Filled 2023-02-05: qty 5, 25d supply, fill #0

## 2023-02-05 MED ORDER — OFLOXACIN 0.3 % OP SOLN
OPHTHALMIC | 5 refills | Status: DC
  Filled 2023-02-05: qty 5, 25d supply, fill #0

## 2023-02-05 NOTE — Telephone Encounter (Signed)
Noted  

## 2023-02-05 NOTE — Telephone Encounter (Signed)
  Chief Complaint: shortness of breath with exertion Symptoms: Has to stop and get his breath when in the grocery store and going up and down steps.   "I'm getting tired of feeling short of breath with any exertion". Frequency: Intermittently.  "This has been going on a long time".    Pertinent Negatives: Patient denies chest pain or dizziness. Disposition: [] ED /[] Urgent Care (no appt availability in office) / [x] Appointment(In office/virtual)/ []  [facility]/ [] Home Care/ [] Refused Recommended Disposition /[] [facility]/ []  Follow-up with PCP Additional Notes: Has an appt with Dr. Alvis Lemmings for 03/26/2023.   I let pt know I would put him on the wait list and send a message about trying to get him worked in sooner than July.   He was agreeable to this.       I also went over the s/s he needed to go to the ED:  worsening shortness of breath, chest pain, dizziness, ankle swelling.   He verbalized understanding.

## 2023-02-05 NOTE — Telephone Encounter (Signed)
Reason for Disposition  [1] MILD difficulty breathing (e.g., minimal/no SOB at rest, SOB with walking, pulse <100) AND [2] NEW-onset or WORSE than normal  Answer Assessment - Initial Assessment Questions 1. RESPIRATORY STATUS: "Describe your breathing?" (e.g., wheezing, shortness of breath, unable to speak, severe coughing)      Having trouble breathing and I don't understand.   I walk around the grocery and have to stop and get my breath.     2. ONSET: "When did this breathing problem begin?"      Sine I have been in the hospital.    3. PATTERN "Does the difficult breathing come and go, or has it been constant since it started?"      Not all the time but having trouble breathing with exertion.    4. SEVERITY: "How bad is your breathing?" (e.g., mild, moderate, severe)    - MILD: No SOB at rest, mild SOB with walking, speaks normally in sentences, can lie down, no retractions, pulse < 100.    - MODERATE: SOB at rest, SOB with minimal exertion and prefers to sit, cannot lie down flat, speaks in phrases, mild retractions, audible wheezing, pulse 100-120.    - SEVERE: Very SOB at rest, speaks in single words, struggling to breathe, sitting hunched forward, retractions, pulse > 120      Mild No chest pain 5. RECURRENT SYMPTOM: "Have you had difficulty breathing before?" If Yes, ask: "When was the last time?" and "What happened that time?"      Yes   I was in the hospital about 9 yrs ago.   I got a staff infection.  6. CARDIAC HISTORY: "Do you have any history of heart disease?" (e.g., heart attack, angina, bypass surgery, angioplasty)      Yes   I do have a heart doctor but I don't remember his name.  My heart dr. Retired.   I don't remember the new one's name.     7. LUNG HISTORY: "Do you have any history of lung disease?"  (e.g., pulmonary embolus, asthma, emphysema)     No 8. CAUSE: "What do you think is causing the breathing problem?"      I don't know.     9. OTHER SYMPTOMS: "Do you have any  other symptoms? (e.g., dizziness, runny nose, cough, chest pain, fever)     I take a water pill for swelling in ankles.   No dizziness. 10. O2 SATURATION MONITOR:  "Do you use an oxygen saturation monitor (pulse oximeter) at home?" If Yes, ask: "What is your reading (oxygen level) today?" "What is your usual oxygen saturation reading?" (e.g., 95%)     I have a pulse ox.     My worst day it's 96-97% when I'm stopping to catch my breath like in the grocery store.    I carry it in my pocket and check it when I feel short of breath. 11. PREGNANCY: "Is there any chance you are pregnant?" "When was your last menstrual period?"       N/A 12. TRAVEL: "Have you traveled out of the country in the last month?" (e.g., travel history, exposures)       N/A  Protocols used: Breathing Difficulty-A-AH

## 2023-02-07 ENCOUNTER — Other Ambulatory Visit: Payer: Self-pay

## 2023-02-09 DIAGNOSIS — H4311 Vitreous hemorrhage, right eye: Secondary | ICD-10-CM | POA: Diagnosis not present

## 2023-02-09 DIAGNOSIS — H33021 Retinal detachment with multiple breaks, right eye: Secondary | ICD-10-CM | POA: Diagnosis not present

## 2023-02-14 ENCOUNTER — Other Ambulatory Visit: Payer: Self-pay

## 2023-02-14 ENCOUNTER — Encounter: Payer: Self-pay | Admitting: Family Medicine

## 2023-02-14 ENCOUNTER — Ambulatory Visit: Payer: Medicaid Other | Attending: Family Medicine | Admitting: Family Medicine

## 2023-02-14 VITALS — BP 143/92 | HR 78 | Temp 97.9°F | Ht 73.0 in | Wt 356.0 lb

## 2023-02-14 DIAGNOSIS — E785 Hyperlipidemia, unspecified: Secondary | ICD-10-CM

## 2023-02-14 DIAGNOSIS — E1169 Type 2 diabetes mellitus with other specified complication: Secondary | ICD-10-CM | POA: Insufficient documentation

## 2023-02-14 DIAGNOSIS — I4891 Unspecified atrial fibrillation: Secondary | ICD-10-CM

## 2023-02-14 DIAGNOSIS — Z6841 Body Mass Index (BMI) 40.0 and over, adult: Secondary | ICD-10-CM

## 2023-02-14 DIAGNOSIS — R0609 Other forms of dyspnea: Secondary | ICD-10-CM | POA: Diagnosis not present

## 2023-02-14 DIAGNOSIS — E669 Obesity, unspecified: Secondary | ICD-10-CM | POA: Diagnosis not present

## 2023-02-14 DIAGNOSIS — L609 Nail disorder, unspecified: Secondary | ICD-10-CM

## 2023-02-14 DIAGNOSIS — D1809 Hemangioma of other sites: Secondary | ICD-10-CM

## 2023-02-14 DIAGNOSIS — I5042 Chronic combined systolic (congestive) and diastolic (congestive) heart failure: Secondary | ICD-10-CM

## 2023-02-14 DIAGNOSIS — Z7984 Long term (current) use of oral hypoglycemic drugs: Secondary | ICD-10-CM | POA: Diagnosis not present

## 2023-02-14 DIAGNOSIS — M1A0790 Idiopathic chronic gout, unspecified ankle and foot, without tophus (tophi): Secondary | ICD-10-CM | POA: Diagnosis not present

## 2023-02-14 DIAGNOSIS — I110 Hypertensive heart disease with heart failure: Secondary | ICD-10-CM | POA: Diagnosis not present

## 2023-02-14 LAB — POCT GLYCOSYLATED HEMOGLOBIN (HGB A1C): HbA1c, POC (controlled diabetic range): 5.9 % (ref 0.0–7.0)

## 2023-02-14 MED ORDER — DAPAGLIFLOZIN PROPANEDIOL 5 MG PO TABS
5.0000 mg | ORAL_TABLET | Freq: Every day | ORAL | 1 refills | Status: DC
Start: 2023-02-14 — End: 2023-10-05
  Filled 2023-02-14 – 2023-03-15 (×2): qty 90, 90d supply, fill #0
  Filled 2023-06-13: qty 90, 90d supply, fill #1

## 2023-02-14 NOTE — Progress Notes (Signed)
Subjective:  Patient ID: Joe Blake, male    DOB: August 08, 1966  Age: 57 y.o. MRN: [id number]  CC: Diabetes   HPI Joe Blake is a 57 y.o. year old male with a history of type 2 diabetes mellitus (A1c 6.2 ) hypertension, Gout, congestive heart failure (EF 30-35% from 05/2018), atrial fibrillation with RVR/atrial flutter status post DCCV on 03/2018 (currently on anticoagulation with Xarelto), obstructive sleep apnea.   Interval History:  He gets dyspneic on exertion and is wondering why.  He saw the cardiology NP 2 months ago.  Oxygen saturation today is at 96%. he does not smoke.  He has a rash on his left leg and every time he hits something it bleeds in a projected fashion. This has been present x3-4 years. It does not itch and is not painful. The left ring finger has a bump which has been present x6 months and he has nticed concave curvature of the nail  He has not had a gout flare in over a year.  Doing well on his diabetic regimen as well as his antihypertensive.  He recently underwent right eye surgery. Past Medical History:  Diagnosis Date   Acute systolic HF (heart failure) (HCC)    Atrial fibrillation (HCC)    Diabetes mellitus without complication (HCC)    Hiatal hernia    History of esophageal ulcer    History of gastric ulcer    Hypertension    Morbid obesity (HCC)     Past Surgical History:  Procedure Laterality Date   ABDOMINAL SURGERY     APPENDECTOMY     at 57 years old   BIOPSY  08/11/2018   Procedure: BIOPSY;  Surgeon: Carman Ching, MD;  Location: [facility];  Service: Endoscopy;;   CARDIOVERSION N/A 03/18/2018   Procedure: CARDIOVERSION;  Surgeon: Chilton Si, MD;  Location: [facility] END[facility]COPY;  Service: Cardiovascular;  Laterality: N/A;   ESOPHAGOGASTRODUODENOSCOPY (EGD) WITH PROPOFOL N/A 08/11/2018   Procedure: ESOPHAGOGASTRODUODENOSCOPY (EGD) WITH PROPOFOL;  Surgeon: Carman Ching, MD;  Location: [facility] END[facility]COPY;  Service: Endoscopy;  Laterality: N/A;     Family History  Problem Relation Age of Onset   Heart attack Paternal Grandfather    Hyperlipidemia Mother    Hypertension Mother     Social History   Socioeconomic History   Marital status: Single    Spouse name: Not on file   Number of children: Not on file   Years of education: Not on file   Highest education level: Some college, no degree  Occupational History   Not on file  Tobacco Use   Smoking status: Never   Smokeless tobacco: Never  Vaping Use   Vaping Use: Never used  Substance and Sexual Activity   Alcohol use: No   Drug use: No   Sexual activity: Yes    Partners: Female  Other Topics Concern   Not on file  Social History Narrative   Not on file   Social Determinants of Health   Financial Resource Strain: Medium Risk (02/12/2023)   Overall Financial Resource Strain (CARDIA)    Difficulty of Paying Living Expenses: Somewhat hard  Food Insecurity: Food Insecurity Present (02/12/2023)   Hunger Vital Sign    Worried About Running Out of Food in the Last Year: Sometimes true    Ran Out of Food in the Last Year: Sometimes true  Transportation Needs: No Transportation Needs (02/12/2023)   PRAPARE - Administrator, Civil Service (Medical): No    Lack  of Transportation (Non-Medical): No  Physical Activity: Sufficiently Active (02/12/2023)   Exercise Vital Sign    Days of Exercise per Week: 7 days    Minutes of Exercise per Session: 60 min  Stress: No Stress Concern Present (02/12/2023)   [employer] of Occupational Health - Occupational Stress Questionnaire    Feeling of Stress : Not at all  Social Connections: Moderately Isolated (02/12/2023)   Social Connection and Isolation Panel [NHANES]    Frequency of Communication with Friends and Family: More than three times a week    Frequency of Social Gatherings with Friends and Family: Twice a week    Attends Religious Services: More than 4 times per year    Active Member of [employer] or  Organizations: No    Attends Engineer, structural: Not on file    Marital Status: Never married    No Known Allergies  Outpatient Medications Prior to Visit  Medication Sig Dispense Refill   allopurinol (ZYLOPRIM) 300 MG tablet Take 1 tablet (300 mg total) by mouth daily. 90 tablet 0   atorvastatin (LIPITOR) 80 MG tablet Take 1 tablet (80 mg total) by mouth daily. 90 tablet 1   colchicine (COLCRYS) 0.6 MG tablet TAKE 1 TABLET (0.6 MG TOTAL) BY MOUTH ONCE AS NEEDED (TAKE 1.2MG  (2 tablets)AND THEN 0.71M (1 tablet) 1 HOUR LATER IF STILL HAVING SYMPTOMS). 30 tablet 0   furosemide (LASIX) 80 MG tablet Take 1 tablet (80 mg total) by mouth in the morning AND 0.5 tablets (40 mg total) every evening. 135 tablet 6   metoprolol succinate (TOPROL-XL) 100 MG 24 hr tablet Take 1 tablet (100 mg total) by mouth daily. Take with or immediately following a meal. 90 tablet 3   ofloxacin (OCUFLOX) 0.3 % ophthalmic solution Instill one drop in right eye four times daily; begin one day prior to surgery continue as directed 5 mL 5   prednisoLONE acetate (PRED FORTE) 1 % ophthalmic suspension Instill one drop in right eye four times daily; begin one day after surgery continue as directed 10 mL 5   rivaroxaban (XARELTO) 20 MG TABS tablet Take 1 tablet (20 mg total) by mouth daily with supper. 90 tablet 3   sacubitril-valsartan (ENTRESTO) 49-51 MG Take 1 tablet by mouth 2 (two) times daily. 60 tablet 6   dapagliflozin propanediol (FARXIGA) 5 MG TABS tablet Take 1 tablet (5 mg total) by mouth daily before breakfast. 90 tablet 0   doxycycline (VIBRA-TABS) 100 MG tablet Take 1 tablet (100 mg total) by mouth 2 (two) times daily. 20 tablet 0   No facility-administered medications prior to visit.     ROS Review of Systems  Constitutional:  Negative for activity change and appetite change.  HENT:  Negative for sinus pressure and sore throat.   Respiratory:  Negative for chest tightness, shortness of breath and  wheezing.   Cardiovascular:  Negative for chest pain and palpitations.  Gastrointestinal:  Negative for abdominal distention, abdominal pain and constipation.  Genitourinary: Negative.   Musculoskeletal: Negative.   Psychiatric/Behavioral:  Negative for behavioral problems and dysphoric mood.     Objective:  BP (!) 143/92   Pulse 78   Temp 97.9 F (36.6 C) (Oral)   Ht 6\' 1"  (1.854 m)   Wt (!) 356 lb (161.5 kg)   SpO2 96%   BMI 46.97 kg/m      02/14/2023    1:47 PM 11/21/2022    8:35 AM 11/21/2022    8:21 AM  BP/Weight  Systolic BP 143 156 172  Diastolic BP 92 88 94  Wt. (Lbs) 356  360.4  BMI 46.97 kg/m2  47.55 kg/m2      Physical Exam Constitutional:      Appearance: He is well-developed. He is obese.  Cardiovascular:     Rate and Rhythm: Normal rate.     Heart sounds: Normal heart sounds. No murmur heard. Pulmonary:     Effort: Pulmonary effort is normal.     Breath sounds: Normal breath sounds. No wheezing or rales.  Chest:     Chest wall: No tenderness.  Abdominal:     General: Bowel sounds are normal. There is no distension.     Palpations: Abdomen is soft. There is no mass.     Tenderness: There is no abdominal tenderness.  Musculoskeletal:        General: Normal range of motion.     Right lower leg: No edema.     Left lower leg: No edema.     Comments: Cyst on dorsum of left ring finger  Skin:    Comments: Medial aspect of left leg with tiny hemangioma. Concave appearance of nail of left ring finger  Neurological:     Mental Status: He is alert and oriented to person, place, and time.  Psychiatric:        Mood and Affect: Mood normal.        Latest Ref Rng & Units 11/20/2022    4:17 PM 11/23/2021    2:35 PM 06/24/2021    3:27 PM  CMP  Glucose 70 - 99 mg/dL 161  096  045   BUN 6 - 24 mg/dL 17  36  24   Creatinine 0.76 - 1.27 mg/dL 4.09  8.11  9.14   Sodium 134 - 144 mmol/L 141  142  141   Potassium 3.5 - 5.2 mmol/L 4.2  4.4  4.5   Chloride 96  - 106 mmol/L 101  96  99   CO2 20 - 29 mmol/L 22  24  24    Calcium 8.7 - 10.2 mg/dL 9.6  78.2  9.9   Total Protein 6.0 - 8.5 g/dL 8.1  8.1    Total Bilirubin 0.0 - 1.2 mg/dL 0.7  0.6    Alkaline Phos 44 - 121 IU/L 116  104    AST 0 - 40 IU/L 44  31    ALT 0 - 44 IU/L 35  16      Lipid Panel     Component Value Date/Time   CHOL 260 (H) 11/20/2022 1617   TRIG 347 (H) 11/20/2022 1617   HDL 42 11/20/2022 1617   LDLCALC 153 (H) 11/20/2022 1617    CBC    Component Value Date/Time   WBC 6.8 06/24/2021 1527   WBC 7.6 08/15/2018 0620   RBC 5.08 06/24/2021 1527   RBC 2.64 (L) 08/15/2018 0620   HGB 17.5 06/24/2021 1527   HCT 47.2 06/24/2021 1527   PLT 217 06/24/2021 1527   MCV 93 06/24/2021 1527   MCH 34.4 (H) 06/24/2021 1527   MCH 29.9 08/15/2018 0620   MCHC 37.1 (H) 06/24/2021 1527   MCHC 30.6 08/15/2018 0620   RDW 15.3 06/24/2021 1527   LYMPHSABS 1.4 01/20/2020 1045   MONOABS 0.9 08/10/2018 1453   EOSABS 0.2 01/20/2020 1045   BASOSABS 0.1 01/20/2020 1045    Lab Results  Component Value Date   HGBA1C 5.9 02/14/2023    Assessment & Plan:  1. Type 2  diabetes mellitus with other specified complication, without long-term current use of insulin (HCC) Controlled with A1c of 5.9 Continue current regimen Counseled on Diabetic diet, my plate method, 454 minutes of moderate intensity exercise/week Blood sugar logs with fasting goals of 80-120 mg/dl, random of less than 098 and in the event of sugars less than 60 mg/dl or greater than 119 mg/dl encouraged to notify the clinic. Advised on the need for annual eye exams, annual foot exams, Pneumonia vaccine. - POCT glycosylated hemoglobin (Hb A1C) - dapagliflozin propanediol (FARXIGA) 5 MG TABS tablet; Take 1 tablet (5 mg total) by mouth daily before breakfast.  Dispense: 90 tablet; Refill: 1  2. Hypertensive heart disease with chronic combined systolic and diastolic congestive heart failure (HCC) Last echo in 2019 revealed EF of 30  to 35% He is due for repeat echocardiogram Continue SGLT2i, beta-blocker, Entresto Follow-up with cardiology - ECHOCARDIOGRAM COMPLETE; Future  3. Other form of dyspnea Discussed pathophysiology of dyspnea and possible etiologies Likely from his CHF but will also need to evaluate for possible emphysema - DG Chest 2 View; Future - ECHOCARDIOGRAM COMPLETE; Future  4. Atrial fibrillation with RVR (HCC) Currently in sinus rhythm Continue anticoagulation with Xarelto and rate control with metoprolol  5. Idiopathic chronic gout of foot without tophus, unspecified laterality Controlled with no recent flares  6. Hemangioma of other sites Advised to exercise caution to prevent trauma and bleeding from hemangioma on left leg  7. Nail disorder Comments multivitamins and biotin Offered to refer to dermatology but he declines  8. Hyperlipidemia associated with type 2 diabetes mellitus (HCC) Uncontrolled Statin dose was increased after last set of blood work [employer]    Meds ordered this encounter  Medications   dapagliflozin propanediol (FARXIGA) 5 MG TABS tablet    Sig: Take 1 tablet (5 mg total) by mouth daily before breakfast.    Dispense:  90 tablet    Refill:  1    Follow-up: Return in about 6 months (around 08/16/2023) for Chronic medical conditions.       Hoy Register, MD, FAAFP. [facility] [place], [place] [phone]   02/14/2023, 2:18 PM

## 2023-03-15 ENCOUNTER — Other Ambulatory Visit: Payer: Self-pay | Admitting: Family Medicine

## 2023-03-15 ENCOUNTER — Other Ambulatory Visit: Payer: Self-pay

## 2023-03-15 DIAGNOSIS — M1A0790 Idiopathic chronic gout, unspecified ankle and foot, without tophus (tophi): Secondary | ICD-10-CM

## 2023-03-15 MED ORDER — COLCHICINE 0.6 MG PO TABS
ORAL_TABLET | ORAL | 0 refills | Status: AC
Start: 2023-03-15 — End: ?
  Filled 2023-03-15: qty 30, 15d supply, fill #0

## 2023-03-15 MED ORDER — ALLOPURINOL 300 MG PO TABS
300.0000 mg | ORAL_TABLET | Freq: Every day | ORAL | 0 refills | Status: DC
Start: 2023-03-15 — End: 2023-06-13
  Filled 2023-03-15: qty 90, 90d supply, fill #0

## 2023-03-16 ENCOUNTER — Other Ambulatory Visit: Payer: Self-pay

## 2023-03-26 ENCOUNTER — Ambulatory Visit: Payer: Medicaid Other | Admitting: Family Medicine

## 2023-04-02 ENCOUNTER — Ambulatory Visit (HOSPITAL_COMMUNITY)
Admission: RE | Admit: 2023-04-02 | Discharge: 2023-04-02 | Disposition: A | Discharge status: Home / Self Care | Payer: Medicaid Other | Source: Ambulatory Visit | Attending: Family Medicine | Admitting: Family Medicine

## 2023-04-02 DIAGNOSIS — R0609 Other forms of dyspnea: Secondary | ICD-10-CM | POA: Insufficient documentation

## 2023-04-02 DIAGNOSIS — I5042 Chronic combined systolic (congestive) and diastolic (congestive) heart failure: Secondary | ICD-10-CM | POA: Insufficient documentation

## 2023-04-02 DIAGNOSIS — I447 Left bundle-branch block, unspecified: Secondary | ICD-10-CM | POA: Insufficient documentation

## 2023-04-02 DIAGNOSIS — I4892 Unspecified atrial flutter: Secondary | ICD-10-CM | POA: Diagnosis not present

## 2023-04-02 DIAGNOSIS — I358 Other nonrheumatic aortic valve disorders: Secondary | ICD-10-CM | POA: Insufficient documentation

## 2023-04-02 DIAGNOSIS — I110 Hypertensive heart disease with heart failure: Secondary | ICD-10-CM | POA: Diagnosis not present

## 2023-04-02 DIAGNOSIS — I4891 Unspecified atrial fibrillation: Secondary | ICD-10-CM | POA: Diagnosis not present

## 2023-04-02 LAB — ECHOCARDIOGRAM COMPLETE
AR max vel: 2.47 cm2
AV Area VTI: 2.15 cm2
AV Area mean vel: 2.46 cm2
AV Mean grad: 4 mmHg
AV Peak grad: 8.5 mmHg
Ao pk vel: 1.46 m/s
Area-P 1/2: 2.77 cm2
Calc EF: 53.4 %
S' Lateral: 3.7 cm
Single Plane A2C EF: 45.1 %
Single Plane A4C EF: 59.7 %

## 2023-04-02 MED ORDER — PERFLUTREN LIPID MICROSPHERE
1.0000 mL | INTRAVENOUS | Status: AC | PRN
  Administered 2023-04-02: 5 mL via INTRAVENOUS
  Filled 2023-04-02: qty 10

## 2023-04-02 NOTE — Progress Notes (Signed)
Echocardiogram 2D Echocardiogram has been performed.  Joe Blake 04/02/2023, 4:23 PM

## 2023-04-04 ENCOUNTER — Encounter: Payer: Self-pay | Admitting: Family Medicine

## 2023-04-04 ENCOUNTER — Ambulatory Visit (HOSPITAL_COMMUNITY): Payer: Medicaid Other

## 2023-04-05 ENCOUNTER — Other Ambulatory Visit: Payer: Self-pay | Admitting: Family Medicine

## 2023-04-05 DIAGNOSIS — R21 Rash and other nonspecific skin eruption: Secondary | ICD-10-CM

## 2023-04-18 ENCOUNTER — Other Ambulatory Visit: Payer: Self-pay

## 2023-04-18 ENCOUNTER — Other Ambulatory Visit (HOSPITAL_COMMUNITY): Payer: Self-pay

## 2023-04-18 MED ORDER — PREDNISOLONE ACETATE 1 % OP SUSP
1.0000 [drp] | Freq: Four times a day (QID) | OPHTHALMIC | 0 refills | Status: DC
  Filled 2023-04-18: qty 10, 25d supply, fill #0

## 2023-04-18 MED ORDER — MOXIFLOXACIN HCL 0.5 % OP SOLN
1.0000 [drp] | Freq: Four times a day (QID) | OPHTHALMIC | 0 refills | Status: DC
  Filled 2023-04-18: qty 3, 15d supply, fill #0

## 2023-04-23 ENCOUNTER — Other Ambulatory Visit (HOSPITAL_COMMUNITY): Payer: Self-pay

## 2023-04-30 ENCOUNTER — Other Ambulatory Visit: Payer: Self-pay

## 2023-05-09 DIAGNOSIS — H5211 Myopia, right eye: Secondary | ICD-10-CM | POA: Diagnosis not present

## 2023-05-09 DIAGNOSIS — H25811 Combined forms of age-related cataract, right eye: Secondary | ICD-10-CM | POA: Diagnosis not present

## 2023-05-09 DIAGNOSIS — H2511 Age-related nuclear cataract, right eye: Secondary | ICD-10-CM | POA: Diagnosis not present

## 2023-05-14 DIAGNOSIS — H43391 Other vitreous opacities, right eye: Secondary | ICD-10-CM | POA: Diagnosis not present

## 2023-05-14 DIAGNOSIS — H59021 Cataract (lens) fragments in eye following cataract surgery, right eye: Secondary | ICD-10-CM | POA: Diagnosis not present

## 2023-05-21 DIAGNOSIS — D1801 Hemangioma of skin and subcutaneous tissue: Secondary | ICD-10-CM | POA: Diagnosis not present

## 2023-05-23 NOTE — Progress Notes (Unsigned)
Cardiology Office Note:   Date:  05/24/2023  ID:  Joe Blake, DOB [birth year], MRN [id number] PCP: Hoy Register, MD  [facility] Providers Cardiologist:  Rollene Rotunda, MD {  History of Present Illness:   Joe Blake is a 57 y.o. male who presents for follow up of atrial fib with RVR.  He was admitted in March and had an EF of 20 - 25%.  He had been admitted at [facility] in [place] in 2013 and was told his heart was "pumping at 33%".  There was no history of cath, or MI and he is a poor candidate for Myoview secondary to morbid obesity .   His last echo prior to the last appointment with me 30 to 35% in 2019.  However, since then he has had an echo in July demonstrating an EF of 60 to 65% with mild concentric left ventricular hypertrophy.   At the last visit I titrated his Entresto.  He continues to be short of breath with activity.  He works remodeling homes.  He is not describing new PND or orthopnea.  He is not describing new palpitations, presyncope or syncope.  He says he is compliant with his meds.  He has not gotten any information about CPAP.  He said he sleeps okay.  I do note that a year ago when he was short of breath his BNP was normal.  Again the echo EF has improved.   ROS: As stated in the HPI and negative for all other systems.  Studies Reviewed:    EKG:   EKG Interpretation Date/Time:  Thursday May 24 2023 15:09:02 EDT Ventricular Rate:  82 PR Interval:    QRS Duration:  166 QT Interval:  464 QTC Calculation: 542 R Axis:   -85  Text Interpretation: Atrial fibrillation with premature ventricular or aberrantly conducted complexes Right bundle branch block Left anterior fascicular block Bifascicular block When compared with ECG of 11-Aug-2018 00:49, Atrial fibrillation Atrial flutter RBBB is new Confirmed by Rollene Rotunda ([id number]) on 05/24/2023 3:44:39 PM    Risk Assessment/Calculations:     Physical Exam:   VS:  BP (!) 172/96 (BP  Location: Right Arm, Patient Position: Sitting, Cuff Size: Large)   Pulse 82   Ht 6\' 1"  (1.854 m)   Wt (!) 367 lb 9.6 oz (166.7 kg)   SpO2 95%   BMI 48.50 kg/m    Wt Readings from Last 3 Encounters:  05/24/23 (!) 367 lb 9.6 oz (166.7 kg)  02/14/23 (!) 356 lb (161.5 kg)  11/21/22 (!) 360 lb 6.4 oz (163.5 kg)     GEN: Well nourished, well developed in no acute distress NECK: No JVD; No carotid bruits CARDIAC: Irregular RRR, no murmurs, rubs, gallops RESPIRATORY:  Clear to auscultation without rales, wheezing or rhonchi  ABDOMEN: Soft, non-tender, non-distended EXTREMITIES:  Mild bilateral leg edema; No deformity   ASSESSMENT AND PLAN:   Persistent atrial fibrillation [facility]) Joe Blake has a CHA2DS2 - VASc score of 3.  This has been a chronic problem since [place].  He tolerates anticoagulation.  No change in therapy.  He tolerates anticoagulation.  No change in therapy other than as below.     HTN (hypertension) This is being managed in the context of treating his CHF.   CRI-3- Creat was 1.41 in March 2024.  No change in therapy.    Obesity We have discussed diet and exercise in the past.  Sleep Apnea I am going  to try again to get him therapy for this.  He for some reason I am not he has not gotten approval of his device.  H  Chronic Systolic HF Today I am going to titrate his Entresto to 97/103.  I do think his dyspnea is multifactorial.  Walking around the office today he was short of breath.  However, his saturation on room air was 97%.  No change in therapy.        Follow up with APP in 3 months.   Signed, Rollene Rotunda, MD

## 2023-05-24 ENCOUNTER — Other Ambulatory Visit: Payer: Self-pay

## 2023-05-24 ENCOUNTER — Ambulatory Visit: Payer: Medicaid Other | Attending: Cardiology | Admitting: Cardiology

## 2023-05-24 ENCOUNTER — Encounter: Payer: Self-pay | Admitting: Cardiology

## 2023-05-24 VITALS — BP 172/96 | HR 82 | Ht 73.0 in | Wt 367.6 lb

## 2023-05-24 DIAGNOSIS — I10 Essential (primary) hypertension: Secondary | ICD-10-CM

## 2023-05-24 DIAGNOSIS — I5022 Chronic systolic (congestive) heart failure: Secondary | ICD-10-CM | POA: Diagnosis not present

## 2023-05-24 DIAGNOSIS — I4819 Other persistent atrial fibrillation: Secondary | ICD-10-CM

## 2023-05-24 MED ORDER — SACUBITRIL-VALSARTAN 97-103 MG PO TABS
1.0000 | ORAL_TABLET | Freq: Two times a day (BID) | ORAL | 3 refills | Status: DC
  Filled 2023-05-24 – 2023-10-05 (×3): qty 180, 90d supply, fill #0

## 2023-05-24 NOTE — Patient Instructions (Signed)
Medication Instructions:  Start Entresto 97-103 twice a day *If you need a refill on your cardiac medications before your next appointment, please call your pharmacy*   Lab Work: No Labs If you have labs (blood work) drawn today and your tests are completely normal, you will receive your results only by: MyChart Message (if you have MyChart) OR A paper copy in the mail If you have any lab test that is abnormal or we need to change your treatment, we will call you to review the results.   Testing/Procedures: No Testing   Follow-Up: At [facility], you and your health needs are our priority.  As part of our continuing mission to provide you with exceptional heart care, we have created designated Provider Care Teams.  These Care Teams include your primary Cardiologist (physician) and Advanced Practice Providers (APPs -  Physician Assistants and Nurse Practitioners) who all work together to provide you with the care you need, when you need it.  We recommend signing up for the patient portal called "MyChart".  Sign up information is provided on this After Visit Summary.  MyChart is used to connect with patients for Virtual Visits (Telemedicine).  Patients are able to view lab/test results, encounter notes, upcoming appointments, etc.  Non-urgent messages can be sent to your provider as well.   To learn more about what you can do with MyChart, go to [email or web address].    Your next appointment:   1 year(s)  Provider:   Rollene Rotunda, MD

## 2023-06-04 ENCOUNTER — Other Ambulatory Visit: Payer: Self-pay

## 2023-06-13 ENCOUNTER — Other Ambulatory Visit: Payer: Self-pay | Admitting: Family Medicine

## 2023-06-13 DIAGNOSIS — M1A0790 Idiopathic chronic gout, unspecified ankle and foot, without tophus (tophi): Secondary | ICD-10-CM

## 2023-06-13 MED ORDER — ALLOPURINOL 300 MG PO TABS
300.0000 mg | ORAL_TABLET | Freq: Every day | ORAL | 0 refills | Status: DC
Start: 2023-06-13 — End: 2024-03-11
  Filled 2023-06-13: qty 90, 90d supply, fill #0

## 2023-06-14 ENCOUNTER — Other Ambulatory Visit (HOSPITAL_COMMUNITY): Payer: Self-pay

## 2023-06-15 ENCOUNTER — Other Ambulatory Visit: Payer: Self-pay

## 2023-07-06 ENCOUNTER — Other Ambulatory Visit: Payer: Self-pay

## 2023-07-06 DIAGNOSIS — G4733 Obstructive sleep apnea (adult) (pediatric): Secondary | ICD-10-CM

## 2023-07-06 NOTE — Addendum Note (Signed)
Addended by: Brunetta Genera on: 07/06/2023 11:50 AM   Modules accepted: Orders

## 2023-07-16 DIAGNOSIS — G4733 Obstructive sleep apnea (adult) (pediatric): Secondary | ICD-10-CM | POA: Diagnosis not present

## 2023-08-15 DIAGNOSIS — G4733 Obstructive sleep apnea (adult) (pediatric): Secondary | ICD-10-CM | POA: Diagnosis not present

## 2023-08-20 ENCOUNTER — Ambulatory Visit: Payer: Medicaid Other | Admitting: Family Medicine

## 2023-08-20 ENCOUNTER — Encounter: Payer: Self-pay | Admitting: Family Medicine

## 2023-08-20 NOTE — Telephone Encounter (Signed)
 Care team updated and letter sent for eye exam notes.

## 2023-09-15 DIAGNOSIS — G4733 Obstructive sleep apnea (adult) (pediatric): Secondary | ICD-10-CM | POA: Diagnosis not present

## 2023-10-05 ENCOUNTER — Other Ambulatory Visit (HOSPITAL_BASED_OUTPATIENT_CLINIC_OR_DEPARTMENT_OTHER): Payer: Self-pay

## 2023-10-05 ENCOUNTER — Other Ambulatory Visit: Payer: Self-pay

## 2023-10-05 ENCOUNTER — Other Ambulatory Visit: Payer: Self-pay | Admitting: Family Medicine

## 2023-10-05 ENCOUNTER — Other Ambulatory Visit (HOSPITAL_COMMUNITY): Payer: Self-pay

## 2023-10-05 DIAGNOSIS — E1169 Type 2 diabetes mellitus with other specified complication: Secondary | ICD-10-CM

## 2023-10-05 DIAGNOSIS — M1A0790 Idiopathic chronic gout, unspecified ankle and foot, without tophus (tophi): Secondary | ICD-10-CM

## 2023-10-05 MED ORDER — DAPAGLIFLOZIN PROPANEDIOL 5 MG PO TABS
5.0000 mg | ORAL_TABLET | Freq: Every day | ORAL | 0 refills | Status: DC
Start: 2023-10-05 — End: 2024-03-11
  Filled 2023-10-05: qty 30, 30d supply, fill #0

## 2023-10-05 NOTE — Telephone Encounter (Signed)
Requested medication (s) are due for refill today: Yes  Requested medication (s) are on the active medication list: Yes  Last refill:  06/13/23  Future visit scheduled: Yes  Notes to clinic:  Unable to refill per protocol due to failed labs, no updated results.      Requested Prescriptions  Pending Prescriptions Disp Refills   allopurinol (ZYLOPRIM) 300 MG tablet 90 tablet 0    Sig: Take 1 tablet (300 mg total) by mouth daily.     Endocrinology:  Gout Agents - allopurinol Failed - 10/05/2023  2:50 PM      Failed - Uric Acid in normal range and within 360 days    Uric Acid  Date Value Ref Range Status  04/11/2021 7.2 3.8 - 8.4 mg/dL Final    Comment:               Therapeutic target for gout patients: <6.0         Failed - Cr in normal range and within 360 days    Creatinine, Ser  Date Value Ref Range Status  11/20/2022 1.41 (H) 0.76 - 1.27 mg/dL Final         Failed - CBC within normal limits and completed in the last 12 months    WBC  Date Value Ref Range Status  06/24/2021 6.8 3.4 - 10.8 x10E3/uL Final  08/15/2018 7.6 4.0 - 10.5 K/uL Final   RBC  Date Value Ref Range Status  06/24/2021 5.08 4.14 - 5.80 x10E6/uL Final    Comment:    Red blood cells appear slightly agglutinated The RBC, HCT, and red cell indices may be inaccurate due to RBC agglutination.   08/15/2018 2.64 (L) 4.22 - 5.81 MIL/uL Final   Hemoglobin  Date Value Ref Range Status  06/24/2021 17.5 13.0 - 17.7 g/dL Final   Hematocrit  Date Value Ref Range Status  06/24/2021 47.2 37.5 - 51.0 % Final   MCHC  Date Value Ref Range Status  06/24/2021 37.1 (H) 31.5 - 35.7 g/dL Final  16/06/9603 54.0 30.0 - 36.0 g/dL Final   [facility]  Date Value Ref Range Status  06/24/2021 34.4 (H) 26.6 - 33.0 pg Final  08/15/2018 29.9 26.0 - 34.0 pg Final   MCV  Date Value Ref Range Status  06/24/2021 93 79 - 97 fL Final   No results found for: "PLTCOUNTKUC", "LABPLAT", "POCPLA" RDW  Date Value Ref Range Status   06/24/2021 15.3 11.6 - 15.4 % Final         Passed - Valid encounter within last 12 months    Recent Outpatient Visits           7 months ago Type 2 diabetes mellitus with other specified complication, without long-term current use of insulin (HCC)   [facility] Dept Of [place]. [facility] Hoy Register, MD   11 months ago Type 2 diabetes mellitus with other specified complication, without long-term current use of insulin (HCC)   [facility] Dept Of [place]. [facility] Hoy Register, MD   1 year ago Type 2 diabetes mellitus with other specified complication, without long-term current use of insulin (HCC)   [facility] [place] [facility] - [facility] Dept Of [place]. [facility] Hoy Register, MD   2 years ago Idiopathic chronic gout of foot without tophus, unspecified laterality   [facility] - [facility] Dept Of . [facility] Hoy Register, MD   3 years ago Screening for colon cancer   [facility] [place] - [facility] Dept Of [place]. [facility] Hoy Register, MD       Future Appointments             In 2 months Hoy Register, MD [facility] - A Dept Of [place]. [facility]            Signed Prescriptions Disp Refills   dapagliflozin propanediol (FARXIGA) 5 MG TABS tablet 30 tablet 0    Sig: Take 1 tablet (5 mg total) by mouth daily before breakfast.     Endocrinology:  Diabetes - SGLT2 Inhibitors Failed - 10/05/2023  2:50 PM      Failed - Cr in normal range and within 360 days    Creatinine, Ser  Date Value Ref Range Status  11/20/2022 1.41 (H) 0.76 - 1.27 mg/dL Final         Failed - HBA1C is between 0 and 7.9 and within 180 days    HbA1c, POC (prediabetic range)  Date Value Ref Range Status  05/20/2018 5.3 (A) 5.7 - 6.4 % Final   HbA1c, POC (controlled diabetic range)  Date Value Ref Range Status   02/14/2023 5.9 0.0 - 7.0 % Final         Failed - eGFR in normal range and within 360 days    GFR calc Af Amer  Date Value Ref Range Status  06/24/2020 66 >59 mL/min/1.73 Final    Comment:    **In accordance with recommendations from the NKF-ASN Task force,**   [facility] is in the process of updating its eGFR calculation to the   2021 CKD-EPI creatinine equation that estimates kidney function   without a race variable.    GFR calc non Af Amer  Date Value Ref Range Status  06/24/2020 57 (L) >59 mL/min/1.73 Final   eGFR  Date Value Ref Range Status  11/20/2022 59 (L) >59 mL/min/1.73 Final         Failed - Valid encounter within last 6 months    Recent Outpatient Visits           7 months ago Type 2 diabetes mellitus with other specified complication, without long-term current use of insulin (HCC)   [facility] Dept Of [place]. [facility] Hoy Register, MD   11 months ago Type 2 diabetes mellitus with other specified complication, without long-term current use of insulin (HCC)   [facility] - [facility] Dept Of [place]. [facility] Hoy Register, MD   1 year ago Type 2 diabetes mellitus with other specified complication, without long-term current use of insulin (HCC)   [facility] [place] [facility] - A Dept Of [place]. [facility] Hoy Register, MD   2 years ago Idiopathic chronic gout of foot without tophus, unspecified laterality   [facility] - A Dept Of [place]. [facility] Hoy Register, MD   3 years ago Screening for colon cancer   [facility] [place] - A Dept Of [place]. [facility] Hoy Register, MD       Future Appointments             In 2 months Hoy Register, MD [facility] - A Dept Of [place]. [facility]

## 2023-10-05 NOTE — Telephone Encounter (Signed)
Requested Prescriptions  Pending Prescriptions Disp Refills   dapagliflozin propanediol (FARXIGA) 5 MG TABS tablet 30 tablet 0    Sig: Take 1 tablet (5 mg total) by mouth daily before breakfast.     Endocrinology:  Diabetes - SGLT2 Inhibitors Failed - 10/05/2023  2:48 PM      Failed - Cr in normal range and within 360 days    Creatinine, Ser  Date Value Ref Range Status  11/20/2022 1.41 (H) 0.76 - 1.27 mg/dL Final         Failed - HBA1C is between 0 and 7.9 and within 180 days    HbA1c, POC (prediabetic range)  Date Value Ref Range Status  05/20/2018 5.3 (A) 5.7 - 6.4 % Final   HbA1c, POC (controlled diabetic range)  Date Value Ref Range Status  02/14/2023 5.9 0.0 - 7.0 % Final         Failed - eGFR in normal range and within 360 days    GFR calc Af Amer  Date Value Ref Range Status  06/24/2020 66 >59 mL/min/1.73 Final    Comment:    **In accordance with recommendations from the NKF-ASN Task force,**   [facility] is in the process of updating its eGFR calculation to the   2021 CKD-EPI creatinine equation that estimates kidney function   without a race variable.    GFR calc non Af Amer  Date Value Ref Range Status  06/24/2020 57 (L) >59 mL/min/1.73 Final   eGFR  Date Value Ref Range Status  11/20/2022 59 (L) >59 mL/min/1.73 Final         Failed - Valid encounter within last 6 months    Recent Outpatient Visits           7 months ago Type 2 diabetes mellitus with other specified complication, without long-term current use of insulin (HCC)   [facility] Dept Of [place]. [facility] Hoy Register, MD   11 months ago Type 2 diabetes mellitus with other specified complication, without long-term current use of insulin (HCC)   [facility] [place] - [facility] Dept Of [place]. [facility] Hoy Register, MD   1 year ago Type 2 diabetes mellitus with other specified complication, without long-term current use of insulin (HCC)    [facility] - A Dept Of [place]. [facility] Hoy Register, MD   2 years ago Idiopathic chronic gout of foot without tophus, unspecified laterality   [facility] - A Dept Of [place]. [facility] Hoy Register, MD   3 years ago Screening for colon cancer   [facility] [place] - [facility] Dept Of [place]. [facility] Hoy Register, MD       Future Appointments             In 2 months Hoy Register, MD [facility] - [facility] Dept Of [place]. [facility]             allopurinol (ZYLOPRIM) 300 MG tablet 90 tablet 0    Sig: Take 1 tablet (300 mg total) by mouth daily.     Endocrinology:  Gout Agents - allopurinol Failed - 10/05/2023  2:48 PM      Failed - Uric Acid in normal range and within 360 days    Uric Acid  Date Value Ref Range Status  04/11/2021 7.2 3.8 - 8.4 mg/dL  Final    Comment:               Therapeutic target for gout patients: <6.0         Failed - Cr in normal range and within 360 days    Creatinine, Ser  Date Value Ref Range Status  11/20/2022 1.41 (H) 0.76 - 1.27 mg/dL Final         Failed - CBC within normal limits and completed in the last 12 months    WBC  Date Value Ref Range Status  06/24/2021 6.8 3.4 - 10.8 x10E3/uL Final  08/15/2018 7.6 4.0 - 10.5 K/uL Final   RBC  Date Value Ref Range Status  06/24/2021 5.08 4.14 - 5.80 x10E6/uL Final    Comment:    Red blood cells appear slightly agglutinated The RBC, HCT, and red cell indices may be inaccurate due to RBC agglutination.   08/15/2018 2.64 (L) 4.22 - 5.81 MIL/uL Final   Hemoglobin  Date Value Ref Range Status  06/24/2021 17.5 13.0 - 17.7 g/dL Final   Hematocrit  Date Value Ref Range Status  06/24/2021 47.2 37.5 - 51.0 % Final   MCHC  Date Value Ref Range Status  06/24/2021 37.1 (H) 31.5 - 35.7 g/dL Final  93/23/5573 22.0 30.0 - 36.0 g/dL Final   [facility]  Date Value Ref Range  Status  06/24/2021 34.4 (H) 26.6 - 33.0 pg Final  08/15/2018 29.9 26.0 - 34.0 pg Final   MCV  Date Value Ref Range Status  06/24/2021 93 79 - 97 fL Final   No results found for: "PLTCOUNTKUC", "LABPLAT", "POCPLA" RDW  Date Value Ref Range Status  06/24/2021 15.3 11.6 - 15.4 % Final         Passed - Valid encounter within last 12 months    Recent Outpatient Visits           7 months ago Type 2 diabetes mellitus with other specified complication, without long-term current use of insulin (HCC)   [facility] Dept Of [place]. [facility] Hoy Register, MD   11 months ago Type 2 diabetes mellitus with other specified complication, without long-term current use of insulin (HCC)   [facility] [place] - A Dept Of [place]. [facility] Hoy Register, MD   1 year ago Type 2 diabetes mellitus with other specified complication, without long-term current use of insulin (HCC)   [facility] [place] - [facility] Dept Of [place]. [facility] Hoy Register, MD   2 years ago Idiopathic chronic gout of foot without tophus, unspecified laterality   [facility] - A Dept Of [place]. [facility] Hoy Register, MD   3 years ago Screening for colon cancer   [facility] [place] - A Dept Of [place]. [facility] Hoy Register, MD       Future Appointments             In 2 months Hoy Register, MD [facility] - A Dept Of . [facility]

## 2023-10-08 ENCOUNTER — Other Ambulatory Visit: Payer: Self-pay

## 2023-10-09 ENCOUNTER — Other Ambulatory Visit: Payer: Self-pay

## 2023-10-16 DIAGNOSIS — G4733 Obstructive sleep apnea (adult) (pediatric): Secondary | ICD-10-CM | POA: Diagnosis not present

## 2023-11-09 DIAGNOSIS — G4733 Obstructive sleep apnea (adult) (pediatric): Secondary | ICD-10-CM | POA: Diagnosis not present

## 2023-11-13 ENCOUNTER — Other Ambulatory Visit (HOSPITAL_COMMUNITY): Payer: Self-pay

## 2023-11-13 DIAGNOSIS — G4733 Obstructive sleep apnea (adult) (pediatric): Secondary | ICD-10-CM | POA: Diagnosis not present

## 2023-12-10 ENCOUNTER — Ambulatory Visit: Payer: Medicaid Other | Admitting: Family Medicine

## 2023-12-14 DIAGNOSIS — G4733 Obstructive sleep apnea (adult) (pediatric): Secondary | ICD-10-CM | POA: Diagnosis not present

## 2023-12-18 ENCOUNTER — Encounter: Payer: Self-pay | Admitting: Family Medicine

## 2023-12-20 ENCOUNTER — Telehealth: Payer: Self-pay | Admitting: Cardiology

## 2023-12-20 NOTE — Telephone Encounter (Signed)
Returned call to patient left message on personal voice mail to call back. 

## 2023-12-20 NOTE — Telephone Encounter (Signed)
  Per MyChart scheduling message:  Patient is asking for a letter from the provider so that he can renew his handicap parking placard.

## 2023-12-21 NOTE — Telephone Encounter (Signed)
Pt returning nurse call. Please advise.

## 2023-12-21 NOTE — Telephone Encounter (Signed)
 Spoke with Joe Blake, aware dr hochrein will be back in the office Tuesday to be able to sign. Patient would like for the plaque to be mailed to him. Will forward to covering nurse.

## 2023-12-21 NOTE — Telephone Encounter (Signed)
 Spoke with pt, he has a handicap plaque that is expiring and he needs a letter for new one but he can not remember if we gave it to him or his medical doctor gave it. He reports he gets SOB with any exertion and that is why he wants the plaque. Aware will forward to dr hochrein for okay for handicap plaque.

## 2023-12-21 NOTE — Telephone Encounter (Signed)
 Left message for pt to call.

## 2023-12-26 NOTE — Telephone Encounter (Signed)
 MD signed DMV handicap placard form. Mailed to patient, per request

## 2024-01-22 ENCOUNTER — Ambulatory Visit: Admitting: Family Medicine

## 2024-03-04 ENCOUNTER — Other Ambulatory Visit: Payer: Self-pay | Admitting: Medical Genetics

## 2024-03-10 ENCOUNTER — Telehealth: Payer: Self-pay | Admitting: Family Medicine

## 2024-03-10 NOTE — Telephone Encounter (Signed)
 error

## 2024-03-10 NOTE — Telephone Encounter (Signed)
 Pt requesting to do MyChart appt instead

## 2024-03-11 ENCOUNTER — Telehealth (HOSPITAL_BASED_OUTPATIENT_CLINIC_OR_DEPARTMENT_OTHER): Admitting: Family Medicine

## 2024-03-11 ENCOUNTER — Encounter: Payer: Self-pay | Admitting: Family Medicine

## 2024-03-11 ENCOUNTER — Other Ambulatory Visit (HOSPITAL_COMMUNITY): Payer: Self-pay

## 2024-03-11 ENCOUNTER — Ambulatory Visit: Admitting: Family Medicine

## 2024-03-11 ENCOUNTER — Other Ambulatory Visit: Payer: Self-pay

## 2024-03-11 ENCOUNTER — Encounter: Payer: Self-pay | Admitting: Cardiology

## 2024-03-11 DIAGNOSIS — K219 Gastro-esophageal reflux disease without esophagitis: Secondary | ICD-10-CM

## 2024-03-11 DIAGNOSIS — E1122 Type 2 diabetes mellitus with diabetic chronic kidney disease: Secondary | ICD-10-CM | POA: Diagnosis not present

## 2024-03-11 DIAGNOSIS — M1A0790 Idiopathic chronic gout, unspecified ankle and foot, without tophus (tophi): Secondary | ICD-10-CM

## 2024-03-11 DIAGNOSIS — I129 Hypertensive chronic kidney disease with stage 1 through stage 4 chronic kidney disease, or unspecified chronic kidney disease: Secondary | ICD-10-CM

## 2024-03-11 DIAGNOSIS — E785 Hyperlipidemia, unspecified: Secondary | ICD-10-CM | POA: Diagnosis not present

## 2024-03-11 DIAGNOSIS — N1831 Chronic kidney disease, stage 3a: Secondary | ICD-10-CM

## 2024-03-11 DIAGNOSIS — Z7984 Long term (current) use of oral hypoglycemic drugs: Secondary | ICD-10-CM | POA: Diagnosis not present

## 2024-03-11 DIAGNOSIS — I4891 Unspecified atrial fibrillation: Secondary | ICD-10-CM | POA: Diagnosis not present

## 2024-03-11 DIAGNOSIS — E1169 Type 2 diabetes mellitus with other specified complication: Secondary | ICD-10-CM

## 2024-03-11 DIAGNOSIS — I5042 Chronic combined systolic (congestive) and diastolic (congestive) heart failure: Secondary | ICD-10-CM

## 2024-03-11 DIAGNOSIS — I110 Hypertensive heart disease with heart failure: Secondary | ICD-10-CM

## 2024-03-11 MED ORDER — METOPROLOL SUCCINATE ER 100 MG PO TB24
100.0000 mg | ORAL_TABLET | Freq: Every day | ORAL | 3 refills | Status: AC
Start: 2024-03-11 — End: ?
  Filled 2024-03-11: qty 90, 90d supply, fill #0
  Filled 2024-06-18: qty 90, 90d supply, fill #1

## 2024-03-11 MED ORDER — ATORVASTATIN CALCIUM 80 MG PO TABS
80.0000 mg | ORAL_TABLET | Freq: Every day | ORAL | 1 refills | Status: DC
Start: 2024-03-11 — End: 2024-07-01
  Filled 2024-03-11: qty 90, 90d supply, fill #0

## 2024-03-11 MED ORDER — ALLOPURINOL 300 MG PO TABS
300.0000 mg | ORAL_TABLET | Freq: Every day | ORAL | 1 refills | Status: AC
Start: 2024-03-11 — End: ?
  Filled 2024-03-11: qty 90, 90d supply, fill #0
  Filled 2024-06-18: qty 90, 90d supply, fill #1

## 2024-03-11 MED ORDER — RIVAROXABAN 20 MG PO TABS
20.0000 mg | ORAL_TABLET | Freq: Every day | ORAL | 1 refills | Status: AC
Start: 2024-03-11 — End: ?
  Filled 2024-03-11: qty 90, 90d supply, fill #0
  Filled 2024-06-18: qty 90, 90d supply, fill #1

## 2024-03-11 MED ORDER — DAPAGLIFLOZIN PROPANEDIOL 5 MG PO TABS
5.0000 mg | ORAL_TABLET | Freq: Every day | ORAL | 1 refills | Status: AC
Start: 2024-03-11 — End: ?
  Filled 2024-03-11: qty 90, 90d supply, fill #0
  Filled 2024-06-18: qty 90, 90d supply, fill #1

## 2024-03-11 MED ORDER — PANTOPRAZOLE SODIUM 40 MG PO TBEC
40.0000 mg | DELAYED_RELEASE_TABLET | Freq: Every day | ORAL | 1 refills | Status: AC
Start: 2024-03-11 — End: ?
  Filled 2024-03-11: qty 90, 90d supply, fill #0
  Filled 2024-06-18: qty 90, 90d supply, fill #1

## 2024-03-11 NOTE — Progress Notes (Signed)
 Virtual Visit via Video Note  I connected with Joe Blake, on 03/11/2024 at 5:50 PM by video enabled telemedicine device and verified that I am speaking with the correct person using two identifiers.   Consent: I discussed the limitations, risks, security and privacy concerns of performing an evaluation and management service by telemedicine and the availability of in person appointments. I also discussed with the patient that there may be a patient responsible charge related to this service. The patient expressed understanding and agreed to proceed.   Location of [place]: [place]  Location of Provider: Clinic   Persons participating in Telemedicine visit: Joe Blake Dr. Delbert    Discussed the use of AI scribe software for clinical note transcription with the patient, who gave verbal consent to proceed.  History of Present Illness Joe Blake is a 58 year old male with a history of type 2 diabetes mellitus hypertension, Gout, congestive heart failure (EF 30-35% from 05/2018), atrial fibrillation with RVR/atrial flutter status post DCCV on 03/2018 (currently on anticoagulation with Xarelto ), obstructive sleep apnea.,  Stage III CKD who presents for medication refills and lab work.  He has been out of town, affecting his ability to attend medical appointments. He experiences shortness of breath, worsened by hot and humid weather, and is on metoprolol , Xarelto , and Entresto  for his heart conditions. He is unsure which medication he ran out of but has sufficient furosemide  and takes his medications mostly consistently.  He has elevated creatinine level of 1.41 from 11/2022 due to his CKD and drinks herbal tea, believing it aids kidney function. He experiences frequent urination with diuretics, attributing it to a smaller bladder. Current medications include allopurinol , atorvastatin , Farxiga , metoprolol , Xarelto , Entresto , and pantoprazole . He is due for lab work as he has not had  labs in over 1 year. He has also not been to see his cardiologist since 05/2023 and he states when he called for an appointment he was informed he would need to wait till 05/2024.  He experiences reflux symptoms and is requesting a medication for this.  With regards to his gout he has not had any flares and is avoiding high fructose corn syrup which usually triggers his flares.    Past Medical History:  Diagnosis Date   Acute systolic HF (heart failure) (HCC)    Atrial fibrillation (HCC)    Diabetes mellitus without complication (HCC)    Hiatal hernia    History of esophageal ulcer    History of gastric ulcer    Hypertension    Morbid obesity (HCC)    No Known Allergies  Current Outpatient Medications on File Prior to Visit  Medication Sig Dispense Refill   colchicine  (COLCRYS ) 0.6 MG tablet TAKE 2 TABLETS BY MOUTH AND THEN 1 TABLET 1 HOUR LATER IF STILL HAVING SYMPTOMS. 30 tablet 0   furosemide  (LASIX ) 80 MG tablet Take 1 tablet (80 mg total) by mouth in the morning AND 0.5 tablets (40 mg total) every evening. 135 tablet 6   sacubitril -valsartan  (ENTRESTO ) 97-103 MG Take 1 tablet by mouth 2 (two) times daily. 180 tablet 3   No current facility-administered medications on file prior to visit.    ROS: See HPI   Observations/Objective: Awake, alert, oriented x3 Not in acute distress Normal mood      Latest Ref Rng & Units 11/20/2022    4:17 PM 11/23/2021    2:35 PM 06/24/2021    3:27 PM  CMP  Glucose 70 - 99 mg/dL  119  122  140   BUN 6 - 24 mg/dL 17  36  24   Creatinine 0.76 - 1.27 mg/dL 8.58  8.25  8.74   Sodium 134 - 144 mmol/L 141  142  141   Potassium 3.5 - 5.2 mmol/L 4.2  4.4  4.5   Chloride 96 - 106 mmol/L 101  96  99   CO2 20 - 29 mmol/L 22  24  24    Calcium  8.7 - 10.2 mg/dL 9.6  89.6  9.9   Total Protein 6.0 - 8.5 g/dL 8.1  8.1    Total Bilirubin 0.0 - 1.2 mg/dL 0.7  0.6    Alkaline Phos 44 - 121 IU/L 116  104    AST 0 - 40 IU/L 44  31    ALT 0 - 44 IU/L 35   16      Lipid Panel     Component Value Date/Time   CHOL 260 (H) 11/20/2022 1617   TRIG 347 (H) 11/20/2022 1617   HDL 42 11/20/2022 1617   LDLCALC 153 (H) 11/20/2022 1617   LABVLDL 65 (H) 11/20/2022 1617    Lab Results  Component Value Date   HGBA1C 5.9 02/14/2023     Assessment and plan:  1. Idiopathic chronic gout of foot without tophus, unspecified laterality Stable with no flares - Uric Acid; Future - allopurinol  (ZYLOPRIM ) 300 MG tablet; Take 1 tablet (300 mg total) by mouth daily.  Dispense: 90 tablet; Refill: 1  2. Type 2 diabetes mellitus with other specified complication, without long-term current use of insulin  (HCC) Controlled with A1c of 5.9 He is due for an A1c - CBC with Differential/Platelet; Future - Microalbumin / creatinine urine ratio; Future - CMP14+EGFR; Future - atorvastatin  (LIPITOR) 80 MG tablet; Take 1 tablet (80 mg total) by mouth daily.  Dispense: 90 tablet; Refill: 1 - dapagliflozin  propanediol (FARXIGA ) 5 MG TABS tablet; Take 1 tablet (5 mg total) by mouth daily before breakfast.  Dispense: 90 tablet; Refill: 1  3. Hypertensive heart disease with chronic combined systolic and diastolic congestive heart failure (HCC) (Primary) EF of 30 to 35% He has chronic dyspnea Continue Entresto , SGLT2i, beta-blocker Advised to schedule pulmonary cardiology - metoprolol  succinate (TOPROL -XL) 100 MG 24 hr tablet; Take 1 tablet (100 mg total) by mouth daily. Take with or immediately following a meal.  Dispense: 90 tablet; Refill: 3  4. Atrial fibrillation with RVR (HCC) On rate control with metoprolol  and anticoagulation with Xarelto  - rivaroxaban  (XARELTO ) 20 MG TABS tablet; Take 1 tablet (20 mg total) by mouth daily with supper.  Dispense: 90 tablet; Refill: 1  5. Hyperlipidemia associated with type 2 diabetes mellitus (HCC) Uncontrolled Due for lipid panel Low-cholesterol diet Continue statin - LP+Non-HDL Cholesterol; Future  6. Gastroesophageal  reflux disease without esophagitis Uncontrolled due to not having a PPI I have refilled Protonix . - pantoprazole  (PROTONIX ) 40 MG tablet; Take 1 tablet (40 mg total) by mouth daily.  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   Meds ordered this encounter  Medications   allopurinol  (ZYLOPRIM ) 300 MG tablet    Sig: Take 1 tablet (300 mg total) by  mouth daily.    Dispense:  90 tablet    Refill:  1   atorvastatin  (LIPITOR) 80 MG tablet    Sig: Take 1 tablet (80 mg total) by mouth daily.    Dispense:  90 tablet    Refill:  1    Dose increase   dapagliflozin  propanediol (FARXIGA ) 5 MG TABS tablet    Sig: Take 1 tablet (5 mg total) by mouth daily before breakfast.    Dispense:  90 tablet    Refill:  1    Courtesy refill given, please keep scheduled appointment to received additional refills.   rivaroxaban  (XARELTO ) 20 MG TABS tablet    Sig: Take 1 tablet (20 mg total) by mouth daily with supper.    Dispense:  90 tablet    Refill:  1   metoprolol  succinate (TOPROL -XL) 100 MG 24 hr tablet    Sig: Take 1 tablet (100 mg total) by mouth daily. Take with or immediately following a meal.    Dispense:  90 tablet    Refill:  3    [employer] okay'd 90 day fill from HF clinic [facility] 4/16 refills changed per [employer]   pantoprazole  (PROTONIX ) 40 MG tablet    Sig: Take 1 tablet (40 mg total) by mouth daily.    Dispense:  90 tablet    Refill:  1    Follow Up Instructions: Return in about 3 months (around 06/11/2024) for Chronic medical conditions.    I discussed the assessment and treatment plan with the patient. The patient was provided  an opportunity to ask questions and all were answered. The patient agreed with the plan and demonstrated an understanding of the instructions.   The patient was advised to call back or seek an in-person evaluation if the symptoms worsen or if the condition fails to improve as anticipated.     I provided 19 minutes total of Telehealth time during this encounter including median intraservice time, reviewing previous notes, investigations, ordering medications, medical decision making, coordinating care and patient verbalized understanding at the end of the visit.     Corrina Sabin, MD, FAAFP. [facility] [place], [place] [phone]   03/11/2024, 5:50 PM

## 2024-03-11 NOTE — Patient Instructions (Signed)
 VISIT SUMMARY:  Joe Blake, during your visit today, we addressed your medication refills and discussed the need for lab work to monitor your health conditions. You mentioned experiencing shortness of breath, especially in hot and humid weather, and we reviewed your current medications and their effects. We also discussed the importance of consistent medication use and scheduled necessary lab tests to monitor your kidney and liver function, cholesterol, uric acid, and protein levels in your urine.  YOUR PLAN:  -CHRONIC HEART FAILURE: Chronic heart failure means your heart is not pumping blood as well as it should, which can cause symptoms like shortness of breath. We refilled your Metoprolol  and Entresto  to help manage this condition.  -TYPE 2 DIABETES MELLITUS: Type 2 diabetes is a condition where your body does not use insulin  properly, leading to high blood sugar levels. We ordered fasting labs to check your A1c and kidney function, and refilled your Farxiga .  -ATRIAL FIBRILLATION: Atrial fibrillation is an irregular and often rapid heart rate that can increase your risk of strokes. We refilled your Xarelto  to help prevent blood clots.  -IDIOPATHIC CHRONIC GOUT: Gout is a form of arthritis characterized by severe pain, redness, and tenderness in joints. We refilled your Allopurinol  and Colchicine  to manage this condition.  -HYPERLIPIDEMIA: Hyperlipidemia means you have high levels of fats (lipids) in your blood, which can increase your risk of heart disease. We refilled your Atorvastatin  and ordered a fasting cholesterol test.  -HIATAL HERNIA WITH ESOPHAGEAL AND GASTRIC ULCERS: A hiatal hernia occurs when part of your stomach pushes up through your diaphragm, which can cause heartburn. We refilled your Pantoprazole  to manage this.  -GENERAL HEALTH MAINTENANCE: We need to monitor your kidney and liver function due to the potential side effects of your medications. We ordered fasting labs including  CBC, liver function tests, uric acid, and a urine protein test.  INSTRUCTIONS:  Please ensure you complete the fasting labs we ordered when you return in September. We have scheduled a follow-up appointment for you in October to review your lab results and adjust your treatment plan as needed.

## 2024-05-07 ENCOUNTER — Encounter: Payer: Self-pay | Admitting: Family Medicine

## 2024-05-09 ENCOUNTER — Telehealth: Payer: Self-pay | Admitting: Cardiology

## 2024-05-12 ENCOUNTER — Telehealth (HOSPITAL_BASED_OUTPATIENT_CLINIC_OR_DEPARTMENT_OTHER): Admitting: Family Medicine

## 2024-05-12 ENCOUNTER — Encounter: Payer: Self-pay | Admitting: Family Medicine

## 2024-05-12 DIAGNOSIS — R350 Frequency of micturition: Secondary | ICD-10-CM

## 2024-05-12 DIAGNOSIS — I110 Hypertensive heart disease with heart failure: Secondary | ICD-10-CM | POA: Diagnosis not present

## 2024-05-12 DIAGNOSIS — I5042 Chronic combined systolic (congestive) and diastolic (congestive) heart failure: Secondary | ICD-10-CM

## 2024-05-12 DIAGNOSIS — Z125 Encounter for screening for malignant neoplasm of prostate: Secondary | ICD-10-CM | POA: Diagnosis not present

## 2024-05-12 NOTE — Progress Notes (Signed)
 Virtual Visit via Video Note  I connected with Joe Blake, on 05/12/2024 at 1:41 PM by video enabled telemedicine device and verified that I am speaking with the correct person using two identifiers.   Consent: I discussed the limitations, risks, security and privacy concerns of performing an evaluation and management service by telemedicine and the availability of in person appointments. I also discussed with the patient that there may be a patient responsible charge related to this service. The patient expressed understanding and agreed to proceed.   Location of Patient: Home  Location of Provider: Clinic   Persons participating in Telemedicine visit: Joe Blake Dr. Delbert    Discussed the use of AI scribe software for clinical note transcription with the patient, who gave verbal consent to proceed.  History of Present Illness Joe Blake is a 58 year old male with a history of type 2 diabetes mellitus hypertension, Gout, congestive heart failure (EF 30-35% from 05/2018), atrial fibrillation with RVR/atrial flutter status post DCCV on 03/2018 (currently on anticoagulation with Xarelto ), obstructive sleep apnea.,  Stage III CKD  who presents with urinary frequency and difficulty managing diuretic therapy.  He experiences urinary frequency every fifteen minutes for at least nine hours when taking furosemide , limiting his ability to leave the house. This frequency occurs both day and night, disrupting sleep. Without the diuretic, he retains fluid, causing dyspnea.  He has no straining, intermittent stream, or incontinence. He drinks herbal tea and water, avoiding coffee.  His current medication is furosemide  for congestive heart failure, with no other medications for urinary symptoms.      Past Medical History:  Diagnosis Date   Acute systolic HF (heart failure) (HCC)    Atrial fibrillation (HCC)    Diabetes mellitus without complication (HCC)    Hiatal hernia    History of  esophageal ulcer    History of gastric ulcer    Hypertension    Morbid obesity (HCC)    No Known Allergies  Current Outpatient Medications on File Prior to Visit  Medication Sig Dispense Refill   allopurinol  (ZYLOPRIM ) 300 MG tablet Take 1 tablet (300 mg total) by mouth daily. 90 tablet 1   atorvastatin  (LIPITOR) 80 MG tablet Take 1 tablet (80 mg total) by mouth daily. 90 tablet 1   colchicine  (COLCRYS ) 0.6 MG tablet TAKE 2 TABLETS BY MOUTH AND THEN 1 TABLET 1 HOUR LATER IF STILL HAVING SYMPTOMS. 30 tablet 0   dapagliflozin  propanediol (FARXIGA ) 5 MG TABS tablet Take 1 tablet (5 mg total) by mouth daily before breakfast. 90 tablet 1   furosemide  (LASIX ) 80 MG tablet Take 1 tablet (80 mg total) by mouth in the morning AND 0.5 tablets (40 mg total) every evening. 135 tablet 6   metoprolol  succinate (TOPROL -XL) 100 MG 24 hr tablet Take 1 tablet (100 mg total) by mouth daily. Take with or immediately following a meal. 90 tablet 3   pantoprazole  (PROTONIX ) 40 MG tablet Take 1 tablet (40 mg total) by mouth daily. 90 tablet 1   rivaroxaban  (XARELTO ) 20 MG TABS tablet Take 1 tablet (20 mg total) by mouth daily with supper. 90 tablet 1   sacubitril -valsartan  (ENTRESTO ) 97-103 MG Take 1 tablet by mouth 2 (two) times daily. 180 tablet 3   No current facility-administered medications on file prior to visit.    ROS: See HPI  Observations/Objective: Awake, alert, oriented x3 Not in acute distress Normal mood      Latest Ref Rng & Units  11/20/2022    4:17 PM 11/23/2021    2:35 PM 06/24/2021    3:27 PM  CMP  Glucose 70 - 99 mg/dL 880  877  859   BUN 6 - 24 mg/dL 17  36  24   Creatinine 0.76 - 1.27 mg/dL 8.58  8.25  8.74   Sodium 134 - 144 mmol/L 141  142  141   Potassium 3.5 - 5.2 mmol/L 4.2  4.4  4.5   Chloride 96 - 106 mmol/L 101  96  99   CO2 20 - 29 mmol/L 22  24  24    Calcium  8.7 - 10.2 mg/dL 9.6  89.6  9.9   Total Protein 6.0 - 8.5 g/dL 8.1  8.1    Total Bilirubin 0.0 - 1.2 mg/dL  0.7  0.6    Alkaline Phos 44 - 121 IU/L 116  104    AST 0 - 40 IU/L 44  31    ALT 0 - 44 IU/L 35  16      Lipid Panel     Component Value Date/Time   CHOL 260 (H) 11/20/2022 1617   TRIG 347 (H) 11/20/2022 1617   HDL 42 11/20/2022 1617   LDLCALC 153 (H) 11/20/2022 1617   LABVLDL 65 (H) 11/20/2022 1617    Lab Results  Component Value Date   HGBA1C 5.9 02/14/2023     Assessment and plan:   Assessment & Plan Polyuria secondary to diuretic use Polyuria due to furosemide , causing frequent urination and nocturia, impacting daily activities. No BPH symptoms. Symptoms attributed to diuretic use. -Would also love to exclude the possibility of LUTS however he declines LUTS symptoms.  There is an option to add Flomax but he declines at this time. - Refer to urology for evaluation of urinary symptoms per patient request - Add PSA test to upcoming blood work.  Hypertensive heart disease with chronic combined systolic and diastolic heart failure Chronic heart failure managed with furosemide  to prevent symptom exacerbation. Discontinuation leads to fluid retention and dyspnea. - Continue furosemide  as prescribed.  Screening for prostate cancer    No orders of the defined types were placed in this encounter.   Follow Up Instructions: Overdue for in person visit-advised to schedule an appointment in 06/2024   I discussed the assessment and treatment plan with the patient. The patient was provided an opportunity to ask questions and all were answered. The patient agreed with the plan and demonstrated an understanding of the instructions.   The patient was advised to call back or seek an in-person evaluation if the symptoms worsen or if the condition fails to improve as anticipated.     I provided 12 minutes total of Telehealth time during this encounter including median intraservice time, reviewing previous notes, investigations, ordering medications, medical decision making,  coordinating care and patient verbalized understanding at the end of the visit.     Corrina Sabin, MD, FAAFP. [facility] [place], [place] [phone]   05/12/2024, 1:41 PM

## 2024-05-29 ENCOUNTER — Ambulatory Visit: Admitting: Cardiology

## 2024-06-15 NOTE — Progress Notes (Unsigned)
 Cardiology Office Note:   Date:  06/18/2024  ID:  Joe Blake, DOB [birth year], MRN [id number] PCP: Delbert Clam, MD  [facility] Providers Cardiologist:  Lynwood Schilling, MD {  History of Present Illness:   Joe Blake is a 58 y.o. male who presents for follow up of atrial fib with RVR.  He was admitted in March and had an EF of 20 - 25%.  He had been admitted at [facility] in [place] in 2013 and was told his heart was pumping at 33%.  There was no history of cath, or MI and he is a poor candidate for Myoview secondary to morbid obesity .   His last echo prior to the last appointment with me 30 to 35% in 2019.  However, since then he has had an echo in July demonstrating an EF of 60 to 65% with mild concentric left ventricular hypertrophy.    He has had intermittent SOB.  He reports that this happens sporadically when he is walking.  He does say that he has not been taking his diuretic as frequently as he supposed to.  He does not take it because makes him urinate too much.  He did not take any when he was recently traveling in [place] .  He took some yesterday when he got back.  He said he lost quite a bit of fluid.  He is not describing PND or orthopnea.  He is not having any chest pressure, neck or arm discomfort.  He is not having any palpitations, presyncope or syncope.  He has had moderately increased chronic right greater than left lower extremity edema.  ROS: As stated in the HPI and negative for all other systems.  Studies Reviewed:    EKG:   EKG Interpretation Date/Time:  Wednesday June 18 2024 12:54:46 EDT Ventricular Rate:  55 PR Interval:    QRS Duration:  160 QT Interval:  496 QTC Calculation: 474 R Axis:   222  Text Interpretation: Atrial fibrillation with slow ventricular response Right bundle branch block When compared with ECG of 24-May-2023 15:09, Vent. rate has decreased BY  27 BPM QRS axis Shifted left Confirmed by Schilling Lynwood ([id number])  on 06/18/2024 12:58:41 PM   Risk Assessment/Calculations:    CHA2DS2-VASc Score = 2  {Click here to calculate score.  REFRESH note before signing. :1} This indicates a 2.2% annual risk of stroke. The patient's score is based upon: CHF History: 1 HTN History: 1 Diabetes History: 0 Stroke History: 0 Vascular Disease History: 0 Age Score: 0 Gender Score: 0     Physical Exam:   VS:  BP (!) 150/100   Pulse (!) 55   Ht 6' 1 (1.854 m)   Wt (!) 358 lb 6.4 oz (162.6 kg)   SpO2 95%   BMI 47.29 kg/m    Wt Readings from Last 3 Encounters:  06/18/24 (!) 358 lb 6.4 oz (162.6 kg)  05/24/23 (!) 367 lb 9.6 oz (166.7 kg)  02/14/23 (!) 356 lb (161.5 kg)     GEN: Well nourished, well developed in no acute distress NECK: No JVD; No carotid bruits CARDIAC: ***RR, *** murmurs, rubs, gallops RESPIRATORY:  Clear to auscultation without rales, wheezing or rhonchi  ABDOMEN: Soft, non-tender, non-distended EXTREMITIES:  No edema; No deformity   ASSESSMENT AND PLAN:   Persistent atrial fibrillation (HCC) He tolerates anticoagulation and does not feel his fibrillation.  This has been longstanding.  I think he would be very unlikely to maintain  sinus rhythm.  At this point I am continuing rate control and anticoagulation.    HTN (hypertension) This is mildly elevated but he says it is well-controlled in the 130s over 70s at home.  He will manage this with meds as listed.   CRI-3- Creat was mildly elevated in the past.  I will check a BMET.    Obesity He wants to try the Keto Diet and I will sanction this.    Sleep Apnea He steadfastly does not think he has CPAP.  He is not interested in therapy.  He has been called by the company before and we tried to get that initiated but again he does not think he needs this.   Chronic Systolic HF He does have some increased dyspnea and I have encouraged no diuretic use.  He says he already restricts his fluid intake and salt intake.  I am going to check  a BMP and an echo.  I suspect this is an exacerbation of diastolic heart failure.   Shortness of breath As above I will also order pulmonary function testing.    Follow up ***  Signed, Lynwood Schilling, MD

## 2024-06-18 ENCOUNTER — Other Ambulatory Visit (HOSPITAL_COMMUNITY): Payer: Self-pay

## 2024-06-18 ENCOUNTER — Other Ambulatory Visit (HOSPITAL_COMMUNITY)
Admission: RE | Admit: 2024-06-18 | Discharge: 2024-06-18 | Disposition: A | Source: Ambulatory Visit | Attending: Family Medicine | Admitting: Family Medicine

## 2024-06-18 ENCOUNTER — Other Ambulatory Visit: Payer: Self-pay | Admitting: Cardiology

## 2024-06-18 ENCOUNTER — Ambulatory Visit: Attending: Cardiovascular Disease | Admitting: Cardiology

## 2024-06-18 ENCOUNTER — Other Ambulatory Visit: Payer: Self-pay | Admitting: General Practice

## 2024-06-18 ENCOUNTER — Encounter: Payer: Self-pay | Admitting: Cardiology

## 2024-06-18 ENCOUNTER — Other Ambulatory Visit (HOSPITAL_COMMUNITY)
Admission: RE | Admit: 2024-06-18 | Discharge: 2024-06-18 | Disposition: A | Discharge status: Home / Self Care | Source: Ambulatory Visit | Attending: Family Medicine | Admitting: Family Medicine

## 2024-06-18 VITALS — BP 150/100 | HR 55 | Ht 73.0 in | Wt 358.4 lb

## 2024-06-18 DIAGNOSIS — I5022 Chronic systolic (congestive) heart failure: Secondary | ICD-10-CM | POA: Insufficient documentation

## 2024-06-18 DIAGNOSIS — E785 Hyperlipidemia, unspecified: Secondary | ICD-10-CM | POA: Insufficient documentation

## 2024-06-18 DIAGNOSIS — Z125 Encounter for screening for malignant neoplasm of prostate: Secondary | ICD-10-CM | POA: Insufficient documentation

## 2024-06-18 DIAGNOSIS — E1169 Type 2 diabetes mellitus with other specified complication: Secondary | ICD-10-CM | POA: Insufficient documentation

## 2024-06-18 DIAGNOSIS — M1A0790 Idiopathic chronic gout, unspecified ankle and foot, without tophus (tophi): Secondary | ICD-10-CM | POA: Insufficient documentation

## 2024-06-18 DIAGNOSIS — I10 Essential (primary) hypertension: Secondary | ICD-10-CM | POA: Diagnosis not present

## 2024-06-18 DIAGNOSIS — R0602 Shortness of breath: Secondary | ICD-10-CM | POA: Insufficient documentation

## 2024-06-18 DIAGNOSIS — I4891 Unspecified atrial fibrillation: Secondary | ICD-10-CM | POA: Diagnosis not present

## 2024-06-18 NOTE — Patient Instructions (Addendum)
 Medication Instructions:  Your physician recommends that you continue on your current medications as directed. Please refer to the Current Medication list given to you today.  *If you need a refill on your cardiac medications before your next appointment, please call your pharmacy*  Lab Work: BNP, BMET today at [facility] If you have labs (blood work) drawn today and your tests are completely normal, you will receive your results only by: MyChart Message (if you have MyChart) OR A paper copy in the mail If you have any lab test that is abnormal or we need to change your treatment, we will call you to review the results.  Testing/Procedures: Pulmonary Function Test  Echocardiogram  Follow-Up: At [facility], you and your health needs are our priority.  As part of our continuing mission to provide you with exceptional heart care, our providers are all part of one team.  This team includes your primary Cardiologist (physician) and Advanced Practice Providers or APPs (Physician Assistants and Nurse Practitioners) who all work together to provide you with the care you need, when you need it.  Your next appointment:   2 month(s)  Provider:   APP  We recommend signing up for the patient portal called MyChart.  Sign up information is provided on this After Visit Summary.  MyChart is used to connect with patients for Virtual Visits (Telemedicine).  Patients are able to view lab/test results, encounter notes, upcoming appointments, etc.  Non-urgent messages can be sent to your provider as well.   To learn more about what you can do with MyChart, go to [email or web address].

## 2024-06-19 ENCOUNTER — Other Ambulatory Visit: Payer: Self-pay

## 2024-06-20 ENCOUNTER — Other Ambulatory Visit (HOSPITAL_COMMUNITY): Payer: Self-pay

## 2024-06-21 LAB — BASIC METABOLIC PANEL WITH GFR
BUN/Creatinine Ratio: 11 (ref 9–20)
BUN: 18 mg/dL (ref 6–24)
CO2: 27 mmol/L (ref 20–29)
Calcium: 9.5 mg/dL (ref 8.7–10.2)
Chloride: 101 mmol/L (ref 96–106)
Creatinine, Ser: 1.61 mg/dL — ABNORMAL HIGH (ref 0.76–1.27)
Glucose: 114 mg/dL — ABNORMAL HIGH (ref 70–99)
Potassium: 4.4 mmol/L (ref 3.5–5.2)
Sodium: 143 mmol/L (ref 134–144)
eGFR: 50 mL/min/1.73 — ABNORMAL LOW (ref >59)

## 2024-06-21 LAB — PRO B NATRIURETIC PEPTIDE: NT-Pro BNP: 1607 pg/mL — ABNORMAL HIGH (ref 0–210)

## 2024-06-22 ENCOUNTER — Ambulatory Visit: Payer: Self-pay | Admitting: Cardiology

## 2024-06-23 ENCOUNTER — Other Ambulatory Visit: Payer: Self-pay

## 2024-06-23 ENCOUNTER — Other Ambulatory Visit (HOSPITAL_COMMUNITY): Payer: Self-pay

## 2024-06-23 MED ORDER — SACUBITRIL-VALSARTAN 97-103 MG PO TABS
1.0000 | ORAL_TABLET | Freq: Two times a day (BID) | ORAL | 3 refills | Status: AC
  Filled 2024-06-23: qty 180, 90d supply, fill #0

## 2024-06-23 MED ORDER — FUROSEMIDE 80 MG PO TABS
ORAL_TABLET | ORAL | 3 refills | Status: AC
  Filled 2024-06-23: qty 135, 90d supply, fill #0

## 2024-06-27 LAB — GENECONNECT MOLECULAR SCREEN: Genetic Analysis Overall Interpretation: NEGATIVE

## 2024-06-30 ENCOUNTER — Encounter: Payer: Self-pay | Admitting: Family Medicine

## 2024-06-30 ENCOUNTER — Ambulatory Visit: Attending: Family Medicine

## 2024-06-30 DIAGNOSIS — E1169 Type 2 diabetes mellitus with other specified complication: Secondary | ICD-10-CM

## 2024-06-30 DIAGNOSIS — M1A0790 Idiopathic chronic gout, unspecified ankle and foot, without tophus (tophi): Secondary | ICD-10-CM

## 2024-06-30 DIAGNOSIS — Z125 Encounter for screening for malignant neoplasm of prostate: Secondary | ICD-10-CM | POA: Diagnosis not present

## 2024-06-30 DIAGNOSIS — E785 Hyperlipidemia, unspecified: Secondary | ICD-10-CM | POA: Diagnosis not present

## 2024-07-01 ENCOUNTER — Ambulatory Visit: Payer: Self-pay | Admitting: Family Medicine

## 2024-07-01 ENCOUNTER — Other Ambulatory Visit (HOSPITAL_COMMUNITY): Payer: Self-pay

## 2024-07-01 ENCOUNTER — Other Ambulatory Visit: Payer: Self-pay

## 2024-07-01 MED ORDER — ROSUVASTATIN CALCIUM 40 MG PO TABS
40.0000 mg | ORAL_TABLET | Freq: Every day | ORAL | 1 refills | Status: AC
  Filled 2024-07-01 (×2): qty 90, 90d supply, fill #0

## 2024-07-02 ENCOUNTER — Ambulatory Visit (HOSPITAL_COMMUNITY)
Admission: RE | Admit: 2024-07-02 | Discharge: 2024-07-02 | Disposition: A | Discharge status: Home / Self Care | Source: Ambulatory Visit | Attending: Cardiology | Admitting: Cardiology

## 2024-07-02 DIAGNOSIS — R0602 Shortness of breath: Secondary | ICD-10-CM | POA: Insufficient documentation

## 2024-07-02 LAB — ECHOCARDIOGRAM COMPLETE
AR max vel: 1.36 cm2
AV Area VTI: 1.32 cm2
AV Area mean vel: 1.39 cm2
AV Mean grad: 10 mmHg
AV Peak grad: 18 mmHg
Ao pk vel: 2.12 m/s
S' Lateral: 4.4 cm

## 2024-07-02 LAB — CBC WITH DIFFERENTIAL/PLATELET
Basophils Absolute: 0.1 x10E3/uL (ref 0.0–0.2)
Basos: 1 %
EOS (ABSOLUTE): 0.4 x10E3/uL (ref 0.0–0.4)
Eos: 6 %
Hematocrit: 51.9 % — ABNORMAL HIGH (ref 37.5–51.0)
Hemoglobin: 17.6 g/dL (ref 13.0–17.7)
Immature Grans (Abs): 0 x10E3/uL (ref 0.0–0.1)
Immature Granulocytes: 0 %
Lymphocytes Absolute: 1.6 x10E3/uL (ref 0.7–3.1)
Lymphs: 24 %
MCH: 31.3 pg (ref 26.6–33.0)
MCHC: 33.9 g/dL (ref 31.5–35.7)
MCV: 92 fL (ref 79–97)
Monocytes Absolute: 0.5 x10E3/uL (ref 0.1–0.9)
Monocytes: 8 %
Neutrophils Absolute: 4 x10E3/uL (ref 1.4–7.0)
Neutrophils: 61 %
Platelets: 302 x10E3/uL (ref 150–450)
RBC: 5.63 x10E6/uL (ref 4.14–5.80)
RDW: 13.1 % (ref 11.6–15.4)
WBC: 6.6 x10E3/uL (ref 3.4–10.8)

## 2024-07-02 LAB — LP+NON-HDL CHOLESTEROL
Cholesterol, Total: 212 mg/dL — ABNORMAL HIGH (ref 100–199)
HDL: 36 mg/dL — ABNORMAL LOW (ref >39)
LDL Chol Calc (NIH): 140 mg/dL — ABNORMAL HIGH (ref 0–99)
Total Non-HDL-Chol (LDL+VLDL): 176 mg/dL — ABNORMAL HIGH (ref 0–129)
Triglycerides: 201 mg/dL — ABNORMAL HIGH (ref 0–149)
VLDL Cholesterol Cal: 36 mg/dL (ref 5–40)

## 2024-07-02 LAB — CMP14+EGFR
ALT: 17 IU/L (ref 0–44)
AST: 26 IU/L (ref 0–40)
Albumin: 4.2 g/dL (ref 3.8–4.9)
Alkaline Phosphatase: 117 IU/L (ref 47–123)
BUN/Creatinine Ratio: 12 (ref 9–20)
BUN: 18 mg/dL (ref 6–24)
Bilirubin Total: 0.5 mg/dL (ref 0.0–1.2)
CO2: 25 mmol/L (ref 20–29)
Calcium: 10.2 mg/dL (ref 8.7–10.2)
Chloride: 101 mmol/L (ref 96–106)
Creatinine, Ser: 1.45 mg/dL — ABNORMAL HIGH (ref 0.76–1.27)
Globulin, Total: 3.4 g/dL (ref 1.5–4.5)
Glucose: 124 mg/dL — ABNORMAL HIGH (ref 70–99)
Potassium: 4.9 mmol/L (ref 3.5–5.2)
Sodium: 141 mmol/L (ref 134–144)
Total Protein: 7.6 g/dL (ref 6.0–8.5)
eGFR: 56 mL/min/1.73 — ABNORMAL LOW (ref >59)

## 2024-07-02 LAB — PSA, TOTAL AND FREE
PSA, Free Pct: 35 %
PSA, Free: 0.07 ng/mL
Prostate Specific Ag, Serum: 0.2 ng/mL (ref 0.0–4.0)

## 2024-07-02 LAB — URIC ACID: Uric Acid: 6.5 mg/dL (ref 3.8–8.4)

## 2024-07-02 LAB — MICROALBUMIN / CREATININE URINE RATIO
Creatinine, Urine: 255.5 mg/dL
Microalb/Creat Ratio: 712 mg/g{creat} — AB (ref 0–29)
Microalbumin, Urine: 1818.2 ug/mL

## 2024-07-02 MED ORDER — PERFLUTREN LIPID MICROSPHERE
1.0000 mL | INTRAVENOUS | Status: AC | PRN
  Administered 2024-07-02: 1 mL via INTRAVENOUS

## 2024-07-07 ENCOUNTER — Emergency Department (HOSPITAL_COMMUNITY)

## 2024-07-07 ENCOUNTER — Encounter (HOSPITAL_COMMUNITY): Payer: Self-pay

## 2024-07-07 ENCOUNTER — Emergency Department (HOSPITAL_COMMUNITY)
Admission: EM | Admit: 2024-07-07 | Discharge: 2024-07-07 | Disposition: A | Discharge status: Home / Self Care | Attending: Emergency Medicine | Admitting: Emergency Medicine

## 2024-07-07 ENCOUNTER — Other Ambulatory Visit: Payer: Self-pay

## 2024-07-07 DIAGNOSIS — S46002A Unspecified injury of muscle(s) and tendon(s) of the rotator cuff of left shoulder, initial encounter: Secondary | ICD-10-CM | POA: Insufficient documentation

## 2024-07-07 DIAGNOSIS — E119 Type 2 diabetes mellitus without complications: Secondary | ICD-10-CM | POA: Insufficient documentation

## 2024-07-07 DIAGNOSIS — I4891 Unspecified atrial fibrillation: Secondary | ICD-10-CM | POA: Insufficient documentation

## 2024-07-07 DIAGNOSIS — Z7901 Long term (current) use of anticoagulants: Secondary | ICD-10-CM | POA: Insufficient documentation

## 2024-07-07 DIAGNOSIS — I110 Hypertensive heart disease with heart failure: Secondary | ICD-10-CM | POA: Insufficient documentation

## 2024-07-07 DIAGNOSIS — X501XXA Overexertion from prolonged static or awkward postures, initial encounter: Secondary | ICD-10-CM | POA: Insufficient documentation

## 2024-07-07 DIAGNOSIS — I5021 Acute systolic (congestive) heart failure: Secondary | ICD-10-CM | POA: Diagnosis not present

## 2024-07-07 NOTE — ED Provider Notes (Addendum)
 [place] EMERGENCY DEPARTMENT AT [facility] Provider Note  CSN: [id number] Arrival date & time: 07/07/24 1500  Chief Complaint(s) Shoulder Injury  HPI Joe Blake is a 58 y.o. male history of CHF, atrial fibrillation on anticoagulation, diabetes presenting with fall.  Patient reports 2 days ago, slid back into wall, shoulder got twisted behind him.  Reports pain in the left shoulder.  Denies any other injuries.  Denies hitting his head or headaches.  Denies any pain in the right upper extremity or the legs, neck or back.  Has not taken anything for pain.   Past Medical History Past Medical History:  Diagnosis Date   Acute systolic HF (heart failure) (HCC)    Atrial fibrillation (HCC)    Diabetes mellitus without complication (HCC)    Hiatal hernia    History of esophageal ulcer    History of gastric ulcer    Hypertension    Morbid obesity (HCC)    Patient Active Problem List   Diagnosis Date Noted   Hyperlipidemia associated with type 2 diabetes mellitus (HCC) 02/14/2023   History of GI bleed 01/28/2019   Hematemesis 08/10/2018   Atrial fibrillation with RVR (HCC) 08/10/2018   Acute blood loss anemia 08/10/2018   Sleep apnea 06/14/2018   Plantar fasciitis 05/29/2018   Atrial flutter (HCC) 04/22/2018   Renal insufficiency 01/02/2018   Gout 12/03/2017   Prediabetes 12/03/2017   Chronic anticoagulation 11/30/2017   Morbid obesity (HCC)    Acute systolic HF (heart failure) (HCC)    Persistent atrial fibrillation (HCC) 11/19/2017   Chronic systolic congestive heart failure (HCC) 11/19/2017   Essential hypertension 11/19/2017   Obesity, Class III, BMI 40-49.9 (morbid obesity) (HCC) 11/19/2017   CKD (chronic kidney disease) stage 3, GFR 30-59 ml/min (HCC) 11/19/2017   LBBB (left bundle branch block) 11/19/2017   Home Medication(s) Prior to Admission medications   Medication Sig Start Date End Date Taking? Authorizing Provider  allopurinol  (ZYLOPRIM ) 300 MG  tablet Take 1 tablet (300 mg total) by mouth daily. 03/11/24   Newlin, Enobong, MD  colchicine  (COLCRYS ) 0.6 MG tablet TAKE 2 TABLETS BY MOUTH AND THEN 1 TABLET 1 HOUR LATER IF STILL HAVING SYMPTOMS. Patient taking differently: as needed. TAKE 2 TABLETS BY MOUTH AND THEN 1 TABLET 1 HOUR LATER IF STILL HAVING SYMPTOMS. 03/15/23   Newlin, Enobong, MD  dapagliflozin  propanediol (FARXIGA ) 5 MG TABS tablet Take 1 tablet (5 mg total) by mouth daily before breakfast. 03/11/24   Newlin, Enobong, MD  furosemide  (LASIX ) 80 MG tablet Take 1 tablet (80 mg total) by mouth in the morning AND 0.5 tablets (40 mg total) every evening. 06/23/24   Lavona Agent, MD  metoprolol  succinate (TOPROL -XL) 100 MG 24 hr tablet Take 1 tablet (100 mg total) by mouth daily. Take with or immediately following a meal. 03/11/24   Newlin, Enobong, MD  pantoprazole  (PROTONIX ) 40 MG tablet Take 1 tablet (40 mg total) by mouth daily. 03/11/24   Newlin, Enobong, MD  rivaroxaban  (XARELTO ) 20 MG TABS tablet Take 1 tablet (20 mg total) by mouth daily with supper. 03/11/24   Newlin, Enobong, MD  rosuvastatin (CRESTOR) 40 MG tablet Take 1 tablet (40 mg total) by mouth daily.Discontinue Atorvastatin  07/01/24   Newlin, Enobong, MD  sacubitril -valsartan  (ENTRESTO ) 97-103 MG Take 1 tablet by mouth 2 (two) times daily. 06/23/24   Lavona Agent, MD  Past Surgical History Past Surgical History:  Procedure Laterality Date   ABDOMINAL SURGERY     APPENDECTOMY     at 58 years old   BIOPSY  08/11/2018   Procedure: BIOPSY;  Surgeon: Celestia Agent, MD;  Location: [facility] ENDOSCOPY;  Service: Endoscopy;;   CARDIOVERSION N/A 03/18/2018   Procedure: CARDIOVERSION;  Surgeon: Raford Riggs, MD;  Location: [facility] ENDOSCOPY;  Service: Cardiovascular;  Laterality: N/A;   ESOPHAGOGASTRODUODENOSCOPY (EGD) WITH PROPOFOL  N/A 08/11/2018   Procedure:  ESOPHAGOGASTRODUODENOSCOPY (EGD) WITH PROPOFOL ;  Surgeon: Celestia Agent, MD;  Location: [facility] ENDOSCOPY;  Service: Endoscopy;  Laterality: N/A;   Family History Family History  Problem Relation Age of Onset   Heart attack Paternal Grandfather    Hyperlipidemia Mother    Hypertension Mother     Social History Social History   Tobacco Use   Smoking status: Never   Smokeless tobacco: Never  Vaping Use   Vaping status: Never Used  Substance Use Topics   Alcohol use: No   Drug use: No   Allergies Patient has no known allergies.  Review of Systems Review of Systems  All other systems reviewed and are negative.   Physical Exam Vital Signs  I have reviewed the triage vital signs BP (!) 159/91   Pulse 62   Temp 97.8 F (36.6 C) (Axillary)   Resp 17   SpO2 98%  Physical Exam Vitals and nursing note reviewed.  Constitutional:      General: He is not in acute distress.    Appearance: Normal appearance.  HENT:     Head: Normocephalic and atraumatic.     Mouth/Throat:     Mouth: Mucous membranes are moist.  Eyes:     Conjunctiva/sclera: Conjunctivae normal.  Cardiovascular:     Rate and Rhythm: Normal rate.  Pulmonary:     Effort: Pulmonary effort is normal. No respiratory distress.  Abdominal:     General: Abdomen is flat.  Musculoskeletal:     Comments: No pain on passive range of motion of the left shoulder, pain with active range of motion of the left shoulder, positive empty can test, no tenderness around the elbow, wrist or hand no tenderness around the shoulder.  Right upper extremity and bilateral lower extremities atraumatic.  No midline C, T, L-spine tenderness. No clavicle ttp  Skin:    General: Skin is warm and dry.     Capillary Refill: Capillary refill takes less than 2 seconds.  Neurological:     General: No focal deficit present.     Mental Status: He is alert. Mental status is at baseline.  Psychiatric:        Mood and Affect: Mood normal.         Behavior: Behavior normal.     ED Results and Treatments Labs (all labs ordered are listed, but only abnormal results are displayed) Labs Reviewed - No data to display  Radiology DG Shoulder Left Result Date: 07/07/2024 EXAM: 1 VIEW XRAY OF THE LEFT SHOULDER 07/07/2024 03:33:04 PM COMPARISON: None available. CLINICAL HISTORY: injury FINDINGS: BONES AND JOINTS: Glenohumeral joint is normally aligned. No acute fracture or dislocation. Mild glenohumeral and AC joint osteoarthritis. SOFT TISSUES: No abnormal calcifications. Visualized lung is unremarkable. IMPRESSION: 1. No acute osseous abnormality. 2. Mild glenohumeral and acromioclavicular joint osteoarthritis. Electronically signed by: Waddell Calk MD 07/07/2024 04:06 PM EST RP Workstation: [id number]    Pertinent labs & imaging results that were available during my care of the patient were reviewed by me and considered in my medical decision making (see MDM for details).  Medications Ordered in ED Medications - No data to display                                                                                                                                   Procedures Procedures  (including critical care time)  Medical Decision Making / ED Course   MDM:  58 year old presenting with left shoulder pain.  Symptoms seem most consistent with rotator cuff injury.  His x-ray is negative for any acute fracture, shows some arthritis.  Recommended follow-up with orthopedic surgery and primary physician for physical therapy and further follow-up.  Discussed self-care for symptoms at home.  No evidence of any other traumatic injury.  Patient denies head injury or headache, has no signs of head trauma, doubt any complication related to head injury and anticoagulation.  Will discharge patient to home. All questions answered.  Patient comfortable with plan of discharge. Return precautions discussed with patient and specified on the after visit summary.      Imaging Studies ordered: I ordered imaging studies including XR shoulder  On my interpretation imaging demonstrates no acute injury I independently visualized and interpreted imaging. I agree with the radiologist interpretation   Medicines ordered and prescription drug management: No orders of the defined types were placed in this encounter.   -I have reviewed the patients home medicines and have made adjustments as needed   Social Determinants of Health:  Diagnosis or treatment significantly limited by social determinants of health: obesity  Co morbidities that complicate the patient evaluation  Past Medical History:  Diagnosis Date   Acute systolic HF (heart failure) (HCC)    Atrial fibrillation (HCC)    Diabetes mellitus without complication (HCC)    Hiatal hernia    History of esophageal ulcer    History of gastric ulcer    Hypertension    Morbid obesity (HCC)       Dispostion: Disposition decision including need for hospitalization was considered, and patient discharged from emergency department.    Final Clinical Impression(s) / ED Diagnoses Final diagnoses:  Rotator cuff injury, left, initial encounter     This chart was dictated using voice recognition software.  Despite best efforts to proofread,  errors can occur which can change the documentation meaning.  Francesca Elsie CROME, MD 07/07/24 1708    Francesca Elsie CROME, MD 07/07/24 [phone]

## 2024-07-07 NOTE — Discharge Instructions (Signed)
 We evaluated you for your shoulder injury.  Your x-ray did not show any fractures but showed some arthritis.  We suspect your injury is due to a rotator cuff tear or sprain.  We would recommend that you follow-up with an orthopedic surgeon.  You would benefit from physical therapy.  Occasionally this type of injury may need surgery.  Please take 1000 mg of Tylenol  every 6 hours as needed for pain.  You can also apply ice or lidocaine patches to your shoulder to help with pain.  Please follow-up with Dr. Reyne with orthopedic surgery or your primary physician.  I would strongly recommend that you obtain a referral for physical therapy as this can help with healing.  Return if you have any new or worsening symptoms such as severe worsening pain, difficulty breathing, color change in your arm, or any other new symptoms.

## 2024-07-07 NOTE — ED Triage Notes (Signed)
 PT arrives via POV. Pt states he slipped and fell 2 days ago. Pt states he injured his left shoulder. Pt denies loc, did not hit his head, he is on xarelto . No other injuries from the fall. He is AxOx4.

## 2024-07-30 DIAGNOSIS — H5203 Hypermetropia, bilateral: Secondary | ICD-10-CM | POA: Diagnosis not present

## 2024-09-08 ENCOUNTER — Ambulatory Visit: Admitting: Physician Assistant

## 2024-09-08 ENCOUNTER — Ambulatory Visit: Attending: Physician Assistant | Admitting: Physician Assistant

## 2024-09-08 NOTE — Progress Notes (Deleted)
 " Cardiology Office Note   Date:  09/08/2024  ID:  AABAN GRIEP, DOB [birth year], MRN [id number] PCP: Delbert Clam, MD  [facility] Providers Cardiologist:  Lynwood Schilling, MD  History of Present Illness Joe Blake is a 59 y.o. male with a past medical history of atrial fibrillation with RVR.  Admitted in March and had an EF of 2025%.  Admitted at [facility] in [place] in 2013 and was told his heart was pumping at 33%.  No history of catheter MI and he was a poor candidate for Myoview secondary to morbid obesity.  Last echocardiogram prior to last appointment was 30-35 in 2019.  However, since then he has had an echo in July of this year demonstrating EF of 65% with mild concentric left ventricular hypertrophy.  He did endorse some intermittent SOB at her last appointment in October 2025.  He reported this happens sporadically when he is walking.  Does say that he has not been taking his diuretic as frequently as he should.  He does not want to take it because it makes him urinate too much.  He does not take it when he was recently traveling in [place] .  He did take some the day prior to his appointment when he got back.  He said he lost quite a bit of fluid.  Not describing any PND or orthopnea at that time.  Not having any chest pressure, neck or arm discomfort.  No palpitations, syncope or presyncope.  He had moderately increased chronic right greater than left lower extremity edema.  At his last visit his blood pressure was very high and he mentioned that it is much better controlled at home 130s over 70s.  He was asked to track his blood pressure and continue his medications.  Today, he***  ROS: ***  Studies Reviewed     Echo 07/02/24 IMPRESSIONS     1. Left ventricular ejection fraction, by estimation, is 55 to 60%. The  left ventricle has normal function. The left ventricle has no regional  wall motion abnormalities. The left ventricular internal cavity size  was  mildly dilated. There is mild  concentric left ventricular hypertrophy. Left ventricular diastolic  function could not be evaluated.   2. Right ventricular systolic function is normal. The right ventricular  size is severely enlarged.   3. Right atrial size was mildly dilated.   4. The mitral valve was not well visualized. No evidence of mitral valve  regurgitation. No evidence of mitral stenosis.   5. The aortic valve is tricuspid. Aortic valve regurgitation is trivial.  Aortic valve sclerosis is present, with no evidence of aortic valve  stenosis.   6. Aortic dilatation noted. There is mild dilatation of the ascending  aorta, measuring 43 mm.   7. The inferior vena cava is normal in size with greater than 50%  respiratory variability, suggesting right atrial pressure of 3 mmHg.   Comparison(s): Prior images reviewed side by side. Biventricular dilation  new from 2024 study.   FINDINGS   Left Ventricle: Left ventricular ejection fraction, by estimation, is 55  to 60%. The left ventricle has normal function. The left ventricle has no  regional wall motion abnormalities. Definity  contrast agent was given IV  to delineate the left ventricular   endocardial borders. The left ventricular internal cavity size was mildly  dilated. There is mild concentric left ventricular hypertrophy. Left  ventricular diastolic function could not be evaluated due to atrial  fibrillation.  Left ventricular diastolic  function could not be evaluated.   Right Ventricle: The right ventricular size is severely enlarged. No  increase in right ventricular wall thickness. Right ventricular systolic  function is normal.   Left Atrium: Left atrial size was normal in size.   Right Atrium: Right atrial size was mildly dilated.   Pericardium: There is no evidence of pericardial effusion.   Mitral Valve: The mitral valve was not well visualized. No evidence of  mitral valve regurgitation. No evidence of  mitral valve stenosis.   Tricuspid Valve: The tricuspid valve is normal in structure. Tricuspid  valve regurgitation is not demonstrated. No evidence of tricuspid  stenosis.   The aortic valve is tricuspid. Aortic valve regurgitation is trivial.  Aortic valve sclerosis is present, with no evidence of aortic valve  stenosis.  Pulmonic Valve: The pulmonic valve was not well visualized. Pulmonic valve  regurgitation is not visualized.   Aorta: Aortic dilatation noted. There is mild dilatation of the ascending  aorta, measuring 43 mm.   Venous: The inferior vena cava is normal in size with greater than 50%  respiratory variability, suggesting right atrial pressure of 3 mmHg.   IAS/Shunts: No atrial level shunt detected by color flow Doppler.      Risk Assessment/Calculations {Does this patient have ATRIAL FIBRILLATION?:[phone]} No BP recorded.  {Refresh Note OR Click here to enter BP  :1}***       Physical Exam VS:  There were no vitals taken for this visit.       Wt Readings from Last 3 Encounters:  06/18/24 (!) 358 lb 6.4 oz (162.6 kg)  05/24/23 (!) 367 lb 9.6 oz (166.7 kg)  02/14/23 (!) 356 lb (161.5 kg)    GEN: Well nourished, well developed in no acute distress NECK: No JVD; No carotid bruits CARDIAC: ***RRR, no murmurs, rubs, gallops RESPIRATORY:  Clear to auscultation without rales, wheezing or rhonchi  ABDOMEN: Soft, non-tender, non-distended EXTREMITIES:  No edema; No deformity   ASSESSMENT AND PLAN Persistent atrial fibrillation Hypertension Obesity Sleep apnea Chronic systolic heart failure SOB    {Are you ordering a CV Procedure (e.g. stress test, cath, DCCV, TEE, etc)?   Press F2        :[id number]}  Dispo: ***  Signed, Orren LOISE Fabry, PA-C   "
